# Patient Record
Sex: Female | Born: 1985 | Race: White | Hispanic: No | Marital: Married | Smoking: Never smoker
Health system: Southern US, Community
[De-identification: ages and names within clinical notes are randomized; demographics above are authoritative.]

## PROBLEM LIST (undated history)

## (undated) ENCOUNTER — Inpatient Hospital Stay (HOSPITAL_COMMUNITY): Payer: Self-pay

## (undated) DIAGNOSIS — F419 Anxiety disorder, unspecified: Secondary | ICD-10-CM

## (undated) DIAGNOSIS — Z8742 Personal history of other diseases of the female genital tract: Secondary | ICD-10-CM

## (undated) DIAGNOSIS — A749 Chlamydial infection, unspecified: Secondary | ICD-10-CM

## (undated) DIAGNOSIS — R112 Nausea with vomiting, unspecified: Secondary | ICD-10-CM

## (undated) DIAGNOSIS — K219 Gastro-esophageal reflux disease without esophagitis: Secondary | ICD-10-CM

## (undated) DIAGNOSIS — R51 Headache: Secondary | ICD-10-CM

## (undated) DIAGNOSIS — N189 Chronic kidney disease, unspecified: Secondary | ICD-10-CM

## (undated) DIAGNOSIS — E039 Hypothyroidism, unspecified: Secondary | ICD-10-CM

## (undated) DIAGNOSIS — Z8619 Personal history of other infectious and parasitic diseases: Secondary | ICD-10-CM

## (undated) DIAGNOSIS — F32A Depression, unspecified: Secondary | ICD-10-CM

## (undated) DIAGNOSIS — F329 Major depressive disorder, single episode, unspecified: Secondary | ICD-10-CM

## (undated) DIAGNOSIS — R519 Headache, unspecified: Secondary | ICD-10-CM

## (undated) DIAGNOSIS — I209 Angina pectoris, unspecified: Secondary | ICD-10-CM

## (undated) DIAGNOSIS — Z9889 Other specified postprocedural states: Secondary | ICD-10-CM

## (undated) DIAGNOSIS — E063 Autoimmune thyroiditis: Secondary | ICD-10-CM

## (undated) HISTORY — PX: TOOTH EXTRACTION: SUR596

## (undated) HISTORY — DX: Personal history of other infectious and parasitic diseases: Z86.19

---

## 2014-01-12 ENCOUNTER — Emergency Department (HOSPITAL_COMMUNITY): Payer: 59

## 2014-01-12 ENCOUNTER — Observation Stay (HOSPITAL_COMMUNITY)
Admission: EM | Admit: 2014-01-12 | Discharge: 2014-01-14 | Disposition: A | Discharge status: Home / Self Care | Payer: 59 | Attending: General Surgery | Admitting: General Surgery

## 2014-01-12 ENCOUNTER — Encounter (HOSPITAL_COMMUNITY): Payer: Self-pay | Admitting: Emergency Medicine

## 2014-01-12 DIAGNOSIS — K3580 Unspecified acute appendicitis: Principal | ICD-10-CM | POA: Insufficient documentation

## 2014-01-12 DIAGNOSIS — R112 Nausea with vomiting, unspecified: Secondary | ICD-10-CM

## 2014-01-12 DIAGNOSIS — R1031 Right lower quadrant pain: Secondary | ICD-10-CM

## 2014-01-12 DIAGNOSIS — F411 Generalized anxiety disorder: Secondary | ICD-10-CM | POA: Diagnosis not present

## 2014-01-12 LAB — COMPREHENSIVE METABOLIC PANEL
ALT: 11 U/L (ref 0–35)
ANION GAP: 14 (ref 5–15)
AST: 14 U/L (ref 0–37)
Albumin: 4.5 g/dL (ref 3.5–5.2)
Alkaline Phosphatase: 45 U/L (ref 39–117)
BUN: 10 mg/dL (ref 6–23)
CALCIUM: 9.1 mg/dL (ref 8.4–10.5)
CO2: 22 mEq/L (ref 19–32)
Chloride: 101 mEq/L (ref 96–112)
Creatinine, Ser: 0.62 mg/dL (ref 0.50–1.10)
GFR calc non Af Amer: >90 mL/min (ref >90)
GLUCOSE: 120 mg/dL — AB (ref 70–99)
POTASSIUM: 3.8 meq/L (ref 3.7–5.3)
Sodium: 137 mEq/L (ref 137–147)
TOTAL PROTEIN: 6.8 g/dL (ref 6.0–8.3)
Total Bilirubin: 1.2 mg/dL (ref 0.3–1.2)

## 2014-01-12 LAB — CBC WITH DIFFERENTIAL/PLATELET
Basophils Absolute: 0 K/uL (ref 0.0–0.1)
Basophils Relative: 0 % (ref 0–1)
Eosinophils Absolute: 0 K/uL (ref 0.0–0.7)
Eosinophils Relative: 0 % (ref 0–5)
HCT: 38 % (ref 36.0–46.0)
Hemoglobin: 13.5 g/dL (ref 12.0–15.0)
Lymphocytes Relative: 10 % — ABNORMAL LOW (ref 12–46)
Lymphs Abs: 1.1 K/uL (ref 0.7–4.0)
MCH: 30.5 pg (ref 26.0–34.0)
MCHC: 35.5 g/dL (ref 30.0–36.0)
MCV: 86 fL (ref 78.0–100.0)
Monocytes Absolute: 0.7 K/uL (ref 0.1–1.0)
Monocytes Relative: 6 % (ref 3–12)
Neutro Abs: 9.7 K/uL — ABNORMAL HIGH (ref 1.7–7.7)
Neutrophils Relative %: 84 % — ABNORMAL HIGH (ref 43–77)
Platelets: 186 K/uL (ref 150–400)
RBC: 4.42 MIL/uL (ref 3.87–5.11)
RDW: 12 % (ref 11.5–15.5)
WBC: 11.5 K/uL — ABNORMAL HIGH (ref 4.0–10.5)

## 2014-01-12 LAB — URINALYSIS, ROUTINE W REFLEX MICROSCOPIC
Bilirubin Urine: NEGATIVE
Glucose, UA: NEGATIVE mg/dL
Hgb urine dipstick: NEGATIVE
Ketones, ur: NEGATIVE mg/dL
Leukocytes, UA: NEGATIVE
Nitrite: NEGATIVE
Protein, ur: NEGATIVE mg/dL
Specific Gravity, Urine: 1.012 (ref 1.005–1.030)
Urobilinogen, UA: 0.2 mg/dL (ref 0.0–1.0)
pH: 5.5 (ref 5.0–8.0)

## 2014-01-12 LAB — WET PREP, GENITAL
Trich, Wet Prep: NONE SEEN
Yeast Wet Prep HPF POC: NONE SEEN

## 2014-01-12 LAB — POC URINE PREG, ED: PREG TEST UR: NEGATIVE

## 2014-01-12 LAB — LIPASE, BLOOD: LIPASE: 23 U/L (ref 11–59)

## 2014-01-12 LAB — SYPHILIS: RPR W/REFLEX TO RPR TITER AND TREPONEMAL ANTIBODIES, TRADITIONAL SCREENING AND DIAGNOSIS ALGORITHM

## 2014-01-12 LAB — HIV ANTIBODY (ROUTINE TESTING W REFLEX): HIV 1&2 Ab, 4th Generation: NONREACTIVE

## 2014-01-12 MED ORDER — ONDANSETRON HCL 4 MG/2ML IJ SOLN: 4.0000 mg | Freq: Four times a day (QID) | INTRAMUSCULAR | Status: DC | PRN

## 2014-01-12 MED ORDER — IOHEXOL 300 MG/ML  SOLN
25.0000 mL | Freq: Once | INTRAMUSCULAR | Status: AC | PRN
  Administered 2014-01-12: 25 mL via ORAL

## 2014-01-12 MED ORDER — SODIUM CHLORIDE 0.9 % IV SOLN
1000.0000 mL | INTRAVENOUS | Status: DC
  Administered 2014-01-12: 1000 mL via INTRAVENOUS

## 2014-01-12 MED ORDER — SODIUM CHLORIDE 0.9 % IV SOLN
INTRAVENOUS | Status: DC
  Administered 2014-01-13 – 2014-01-14 (×3): via INTRAVENOUS

## 2014-01-12 MED ORDER — SERTRALINE HCL 100 MG PO TABS
100.0000 mg | ORAL_TABLET | Freq: Every day | ORAL | Status: DC
  Administered 2014-01-14: 100 mg via ORAL
  Filled 2014-01-12: qty 2
  Filled 2014-01-12: qty 1

## 2014-01-12 MED ORDER — ACETAMINOPHEN 325 MG PO TABS: 650.0000 mg | ORAL_TABLET | Freq: Four times a day (QID) | ORAL | Status: DC | PRN

## 2014-01-12 MED ORDER — CLINDAMYCIN PHOSPHATE 900 MG/50ML IV SOLN
900.0000 mg | Freq: Three times a day (TID) | INTRAVENOUS | Status: DC
  Administered 2014-01-13 (×2): 900 mg via INTRAVENOUS
  Filled 2014-01-12 (×4): qty 50

## 2014-01-12 MED ORDER — MORPHINE SULFATE 4 MG/ML IJ SOLN
4.0000 mg | Freq: Once | INTRAMUSCULAR | Status: AC
  Administered 2014-01-12: 4 mg via INTRAVENOUS
  Filled 2014-01-12: qty 1

## 2014-01-12 MED ORDER — AMITRIPTYLINE HCL 50 MG PO TABS
50.0000 mg | ORAL_TABLET | Freq: Every day | ORAL | Status: DC
  Administered 2014-01-12 – 2014-01-13 (×2): 50 mg via ORAL
  Filled 2014-01-12: qty 1
  Filled 2014-01-12 (×3): qty 2

## 2014-01-12 MED ORDER — METRONIDAZOLE IN NACL 5-0.79 MG/ML-% IV SOLN
500.0000 mg | Freq: Three times a day (TID) | INTRAVENOUS | Status: DC
  Administered 2014-01-13 (×2): 500 mg via INTRAVENOUS
  Filled 2014-01-12 (×4): qty 100

## 2014-01-12 MED ORDER — MORPHINE SULFATE 2 MG/ML IJ SOLN
2.0000 mg | INTRAMUSCULAR | Status: DC | PRN
  Administered 2014-01-12 – 2014-01-14 (×6): 2 mg via INTRAVENOUS
  Filled 2014-01-12 (×6): qty 1

## 2014-01-12 MED ORDER — SODIUM CHLORIDE 0.9 % IV SOLN
1000.0000 mL | Freq: Once | INTRAVENOUS | Status: AC
  Administered 2014-01-12: 1000 mL via INTRAVENOUS

## 2014-01-12 MED ORDER — ONDANSETRON HCL 4 MG/2ML IJ SOLN
4.0000 mg | Freq: Once | INTRAMUSCULAR | Status: AC
  Administered 2014-01-12: 4 mg via INTRAVENOUS
  Filled 2014-01-12: qty 2

## 2014-01-12 MED ORDER — ACETAMINOPHEN 650 MG RE SUPP: 650.0000 mg | Freq: Four times a day (QID) | RECTAL | Status: DC | PRN

## 2014-01-12 MED ORDER — IOHEXOL 300 MG/ML  SOLN
100.0000 mL | Freq: Once | INTRAMUSCULAR | Status: AC | PRN
  Administered 2014-01-12: 100 mL via INTRAVENOUS

## 2014-01-12 NOTE — ED Notes (Signed)
MD at bedside. 

## 2014-01-12 NOTE — ED Notes (Signed)
Brought pt back to room via wheelchair with family in tow; pt getting undressed and into a gown at this time; family at bedside; Tramaine, EMT aware pt is in room 

## 2014-01-12 NOTE — ED Provider Notes (Signed)
CSN: [id number]     Arrival date & time 01/12/14  1402 History   First MD Initiated Contact with Patient 01/12/14 1708     Chief Complaint  Patient presents with  . Abdominal Pain    Patient is a 28 y.o. female presenting with abdominal pain. The history is provided by the patient.  Abdominal Pain Pain location:  LLQ and RLQ Pain quality: sharp   Pain radiates to:  Does not radiate Pain severity:  Moderate Onset quality: Pt noticed the pain this morning at 9am. Timing:  Constant Chronicity:  New Relieved by:  Nothing Worsened by:  Position changes and movement (Going over bumps in the road increase the pain.) Ineffective treatments:  None tried Associated symptoms: vomiting   Associated symptoms: no diarrhea, no dysuria, no fever, no vaginal bleeding and no vaginal discharge   LMP 3 weeks ago.  Pt has an implanted birth control device in her arm.  History reviewed. No pertinent past medical history. History reviewed. No pertinent past surgical history. History reviewed. No pertinent family history. History  Substance Use Topics  . Smoking status: Never Smoker   . Smokeless tobacco: Not on file  . Alcohol Use: Yes   OB History   Grav Para Term Preterm Abortions TAB SAB Ect Mult Living                 Review of Systems  Constitutional: Negative for fever.  Gastrointestinal: Positive for vomiting and abdominal pain. Negative for diarrhea.  Genitourinary: Negative for dysuria, vaginal bleeding and vaginal discharge.  All other systems reviewed and are negative.     Allergies  Penicillins  Home Medications   Prior to Admission medications   Medication Sig Start Date End Date Taking? Authorizing Provider  amitriptyline (ELAVIL) 50 MG tablet Take 50 mg by mouth at bedtime.   Yes Historical Provider, MD  butalbital-acetaminophen-caffeine (FIORICET, ESGIC) 50-325-40 MG per tablet Take 1 tablet by mouth every 6 (six) hours as needed for headache or migraine.   Yes  Historical Provider, MD  etonogestrel (IMPLANON) 68 MG IMPL implant Inject 1 each into the skin once.   Yes Historical Provider, MD  sertraline (ZOLOFT) 100 MG tablet Take 100 mg by mouth daily.   Yes Historical Provider, MD  Simethicone (GAS-X PO) Take 1 tablet by mouth as needed (for gas).   Yes Historical Provider, MD  sulfamethoxazole-trimethoprim (BACTRIM DS) 800-160 MG per tablet Take 1 tablet by mouth 2 (two) times daily. For 3 days for UTI    Historical Provider, MD   BP 100/69  Pulse 95  Temp(Src) 98.9 F (37.2 C) (Oral)  Resp 18  Ht 5\' 10"  (1.778 m)  Wt 160 lb (72.576 kg)  BMI 22.96 kg/m2  SpO2 100%  LMP 12/15/2013 Physical Exam  Nursing note and vitals reviewed. Constitutional: She appears well-developed and well-nourished. No distress.  HENT:  Head: Normocephalic and atraumatic.  Right Ear: External ear normal.  Left Ear: External ear normal.  Eyes: Conjunctivae are normal. Right eye exhibits no discharge. Left eye exhibits no discharge. No scleral icterus.  Neck: Neck supple. No tracheal deviation present.  Cardiovascular: Normal rate, regular rhythm and intact distal pulses.   Pulmonary/Chest: Effort normal and breath sounds normal. No stridor. No respiratory distress. She has no wheezes. She has no rales.  Abdominal: Soft. Bowel sounds are normal. She exhibits no distension. There is tenderness in the right upper quadrant, right lower quadrant, epigastric area and left lower quadrant. There is guarding. There  is no rigidity and no rebound. No hernia. Hernia confirmed negative in the right inguinal area and confirmed negative in the left inguinal area.  Genitourinary: Uterus is tender. Uterus is not enlarged. Cervix exhibits discharge (white mucoid discharge). Cervix exhibits no motion tenderness. Right adnexum displays tenderness. Right adnexum displays no mass and no fullness. Left adnexum displays no mass, no tenderness and no fullness. No bleeding around the vagina. No  signs of injury around the vagina. Vaginal discharge found.  Musculoskeletal: She exhibits no edema and no tenderness.  Neurological: She is alert. She has normal strength. No cranial nerve deficit (no facial droop, extraocular movements intact, no slurred speech) or sensory deficit. She exhibits normal muscle tone. She displays no seizure activity. Coordination normal.  Skin: Skin is warm and dry. No rash noted.  Psychiatric: She has a normal mood and affect.    ED Course  Procedures (including critical care time) Labs Review Labs Reviewed  WET PREP, GENITAL - Abnormal; Notable for the following:    Clue Cells Wet Prep HPF POC FEW (*)    WBC, Wet Prep HPF POC FEW (*)    All other components within normal limits  CBC WITH DIFFERENTIAL - Abnormal; Notable for the following:    WBC 11.5 (*)    Neutrophils Relative % 84 (*)    Neutro Abs 9.7 (*)    Lymphocytes Relative 10 (*)    All other components within normal limits  COMPREHENSIVE METABOLIC PANEL - Abnormal; Notable for the following:    Glucose, Bld 120 (*)    All other components within normal limits  GC/CHLAMYDIA PROBE AMP  URINALYSIS, ROUTINE W REFLEX MICROSCOPIC  LIPASE, BLOOD  RPR  HIV ANTIBODY (ROUTINE TESTING)  POC URINE PREG, ED  POC URINE PREG, ED    Imaging Review Ct Abdomen Pelvis W Contrast  01/12/2014   CLINICAL DATA:  Abdominal pain and nausea beginning this morning.  EXAM: CT ABDOMEN AND PELVIS WITH CONTRAST  TECHNIQUE: Multidetector CT imaging of the abdomen and pelvis was performed using the standard protocol following bolus administration of intravenous contrast.  CONTRAST:  100 mL OMNIPAQUE IOHEXOL 300 MG/ML  SOLN  COMPARISON:  None.  FINDINGS: Lung bases are clear.  No pleural or pericardial effusion.  The gallbladder, liver, spleen, adrenal glands, pancreas and left kidney appear normal. There is a punctate nonobstructing stone in the upper pole right kidney. Small low attenuating lesion in the lower pole the  right kidney is consistent with a cyst. Small amount of free pelvic fluid is consistent with physiologic change. The appendix is visualized in the right lower quadrant. A appears mildly prominent. A small amount of fluid is seen off the tip of the cecum. The stomach and small and large bowel appear normal. No focal fluid collection is identified. No lymphadenopathy is seen. No focal bony abnormality is identified.  IMPRESSION: Findings suspicious for acute appendicitis. The examination is otherwise negative.   Electronically Signed   By: Drusilla Kanner M.D.   On: 01/12/2014 20:13   Medications  0.9 %  sodium chloride infusion (0 mLs Intravenous Stopped 01/12/14 1954)    Followed by  0.9 %  sodium chloride infusion (not administered)  morphine 4 MG/ML injection 4 mg (not administered)  ondansetron (ZOFRAN) injection 4 mg (4 mg Intravenous Given 01/12/14 1808)  morphine 4 MG/ML injection 4 mg (4 mg Intravenous Given 01/12/14 1808)  iohexol (OMNIPAQUE) 300 MG/ML solution 25 mL (25 mLs Oral Contrast Given 01/12/14 1818)  iohexol (OMNIPAQUE)  300 MG/ML solution 100 mL (100 mLs Intravenous Contrast Given 01/12/14 1932)     MDM   Final diagnoses:  Acute appendicitis, unspecified acute appendicitis type    CT suggests early appendicitis.  Exam is concerning.  Discussed with Dr Dwain Sarna who will see the patient in the ED.    Linwood Dibbles, MD 01/12/14 2041

## 2014-01-12 NOTE — ED Notes (Signed)
She c/o abd pain and nausea since this am. Hurts to walk. She reports a normal bowel movement this am and denies any changes in bowel/bladder. She vomited once.

## 2014-01-12 NOTE — H&P (Signed)
Heather [name] is an 28 y.o. female.   Chief Complaint: abdominal pain HPI: 67 yof with history of undiagnosed chest pain (she has been evaluated fully and even spent a week at [facility] clinic with no diagnosis) who presents with right lower quadrant pain for about 12 hours that was worsening and not getting better at home.  She had some n/v, having bms. No fevers.    History reviewed. No pertinent past medical history.anxiety  History reviewed. No pertinent past surgical history.  History reviewed. No pertinent family history. Social History:  reports that she has never smoked. She does not have any smokeless tobacco history on file. She reports that she drinks alcohol. She reports that she does not use illicit drugs.  Allergies:  Allergies  Allergen Reactions  . Penicillins Other (See Comments)    Unknown reaction; precaution; entire family allergic    meds zoloft amitryptiline  Results for orders placed during the hospital encounter of 01/12/14 (from the past 48 hour(s))  CBC WITH DIFFERENTIAL     Status: Abnormal   Collection Time    01/12/14  2:22 PM      Result Value Ref Range   WBC 11.5 (*) 4.0 - 10.5 K/uL   RBC 4.42  3.87 - 5.11 MIL/uL   Hemoglobin 13.5  12.0 - 15.0 g/dL   HCT 38.0  36.0 - 46.0 %   MCV 86.0  78.0 - 100.0 fL   MCH 30.5  26.0 - 34.0 pg   MCHC 35.5  30.0 - 36.0 g/dL   RDW 12.0  11.5 - 15.5 %   Platelets 186  150 - 400 K/uL   Neutrophils Relative % 84 (*) 43 - 77 %   Neutro Abs 9.7 (*) 1.7 - 7.7 K/uL   Lymphocytes Relative 10 (*) 12 - 46 %   Lymphs Abs 1.1  0.7 - 4.0 K/uL   Monocytes Relative 6  3 - 12 %   Monocytes Absolute 0.7  0.1 - 1.0 K/uL   Eosinophils Relative 0  0 - 5 %   Eosinophils Absolute 0.0  0.0 - 0.7 K/uL   Basophils Relative 0  0 - 1 %   Basophils Absolute 0.0  0.0 - 0.1 K/uL  COMPREHENSIVE METABOLIC PANEL     Status: Abnormal   Collection Time    01/12/14  2:22 PM      Result Value Ref Range   Sodium 137  137 - 147 mEq/L   Potassium  3.8  3.7 - 5.3 mEq/L   Chloride 101  96 - 112 mEq/L   CO2 22  19 - 32 mEq/L   Glucose, Bld 120 (*) 70 - 99 mg/dL   BUN 10  6 - 23 mg/dL   Creatinine, Ser 0.62  0.50 - 1.10 mg/dL   Calcium 9.1  8.4 - 10.5 mg/dL   Total Protein 6.8  6.0 - 8.3 g/dL   Albumin 4.5  3.5 - 5.2 g/dL   AST 14  0 - 37 U/L   ALT 11  0 - 35 U/L   Alkaline Phosphatase 45  39 - 117 U/L   Total Bilirubin 1.2  0.3 - 1.2 mg/dL   GFR calc non Af Amer >90  >90 mL/min   GFR calc Af Amer >90  >90 mL/min   Comment: (NOTE)     The eGFR has been calculated using the CKD EPI equation.     This calculation has not been validated in all clinical situations.  eGFR's persistently <90 mL/min signify possible Chronic Kidney     Disease.   Anion gap 14  5 - 15  WET PREP, GENITAL     Status: Abnormal   Collection Time    01/12/14  5:32 PM      Result Value Ref Range   Yeast Wet Prep HPF POC NONE SEEN  NONE SEEN   Trich, Wet Prep NONE SEEN  NONE SEEN   Clue Cells Wet Prep HPF POC FEW (*) NONE SEEN   WBC, Wet Prep HPF POC FEW (*) NONE SEEN  LIPASE, BLOOD     Status: None   Collection Time    01/12/14  5:50 PM      Result Value Ref Range   Lipase 23  11 - 59 U/L  URINALYSIS, ROUTINE W REFLEX MICROSCOPIC     Status: None   Collection Time    01/12/14  6:43 PM      Result Value Ref Range   Color, Urine YELLOW  YELLOW   APPearance CLEAR  CLEAR   Specific Gravity, Urine 1.012  1.005 - 1.030   pH 5.5  5.0 - 8.0   Glucose, UA NEGATIVE  NEGATIVE mg/dL   Hgb urine dipstick NEGATIVE  NEGATIVE   Bilirubin Urine NEGATIVE  NEGATIVE   Ketones, ur NEGATIVE  NEGATIVE mg/dL   Protein, ur NEGATIVE  NEGATIVE mg/dL   Urobilinogen, UA 0.2  0.0 - 1.0 mg/dL   Nitrite NEGATIVE  NEGATIVE   Leukocytes, UA NEGATIVE  NEGATIVE   Comment: MICROSCOPIC NOT DONE ON URINES WITH NEGATIVE PROTEIN, BLOOD, LEUKOCYTES, NITRITE, OR GLUCOSE <1000 mg/dL.  POC URINE PREG, ED     Status: None   Collection Time    01/12/14  6:52 PM      Result Value Ref  Range   Preg Test, Ur NEGATIVE  NEGATIVE   Comment:            THE SENSITIVITY OF THIS     METHODOLOGY IS >24 mIU/mL   Ct Abdomen Pelvis W Contrast  01/12/2014   CLINICAL DATA:  Abdominal pain and nausea beginning this morning.  EXAM: CT ABDOMEN AND PELVIS WITH CONTRAST  TECHNIQUE: Multidetector CT imaging of the abdomen and pelvis was performed using the standard protocol following bolus administration of intravenous contrast.  CONTRAST:  100 mL OMNIPAQUE IOHEXOL 300 MG/ML  SOLN  COMPARISON:  None.  FINDINGS: Lung bases are clear.  No pleural or pericardial effusion.  The gallbladder, liver, spleen, adrenal glands, pancreas and left kidney appear normal. There is a punctate nonobstructing stone in the upper pole right kidney. Small low attenuating lesion in the lower pole the right kidney is consistent with a cyst. Small amount of free pelvic fluid is consistent with physiologic change. The appendix is visualized in the right lower quadrant. A appears mildly prominent. A small amount of fluid is seen off the tip of the cecum. The stomach and small and large bowel appear normal. No focal fluid collection is identified. No lymphadenopathy is seen. No focal bony abnormality is identified.  IMPRESSION: Findings suspicious for acute appendicitis. The examination is otherwise negative.   Electronically Signed   By: Inge Rise M.D.   On: 01/12/2014 20:13    Review of Systems  Constitutional: Negative for fever and chills.  Cardiovascular: Positive for chest pain (chronic).  Gastrointestinal: Positive for nausea, vomiting and abdominal pain. Negative for diarrhea and constipation.    Blood pressure 103/68, pulse 94, temperature 98.9 F (37.2 C),  temperature source Oral, resp. rate 18, height '5\' 10"'  (1.778 m), weight 160 lb (72.576 kg), last menstrual period 12/15/2013, SpO2 98.00%. Physical Exam  Vitals reviewed. Constitutional: She is oriented to person, place, and time. She appears  well-developed and well-nourished.  Eyes: No scleral icterus.  Cardiovascular: Normal rate, regular rhythm and normal heart sounds.   Respiratory: Effort normal and breath sounds normal. She has no wheezes. She has no rales.  GI: Soft. Bowel sounds are normal. She exhibits no distension. There is tenderness in the right lower quadrant. There is tenderness at McBurney's point.  Lymphadenopathy:    She has no cervical adenopathy.  Neurological: She is alert and oriented to person, place, and time.     Assessment/Plan Likely acute appendicitis  I think by exam, wbc and ct she likely has early appendicitis. Will plan on admission, iv abx and lap appy in am.  Discussed options, postop recovery and risks.    Jadon Harbaugh 01/12/2014, 10:14 PM

## 2014-01-12 NOTE — ED Notes (Signed)
Pt finished drinking oral CT contrast. CT made aware. 

## 2014-01-12 NOTE — ED Notes (Signed)
Dr. Wakefield at bedside. 

## 2014-01-12 NOTE — ED Notes (Signed)
Attempted to call report. Floor RN unable to accept report.  

## 2014-01-13 ENCOUNTER — Observation Stay (HOSPITAL_COMMUNITY): Payer: 59 | Admitting: Anesthesiology

## 2014-01-13 ENCOUNTER — Encounter (HOSPITAL_COMMUNITY): Payer: 59 | Admitting: Anesthesiology

## 2014-01-13 ENCOUNTER — Encounter (HOSPITAL_COMMUNITY): Payer: Self-pay | Admitting: Anesthesiology

## 2014-01-13 ENCOUNTER — Encounter (HOSPITAL_COMMUNITY)
Admission: EM | Disposition: A | Discharge status: Home / Self Care | Payer: Self-pay | Source: Home / Self Care | Attending: Emergency Medicine

## 2014-01-13 DIAGNOSIS — K3580 Unspecified acute appendicitis: Secondary | ICD-10-CM

## 2014-01-13 HISTORY — PX: LAPAROSCOPIC APPENDECTOMY: SHX408

## 2014-01-13 LAB — SURGICAL PCR SCREEN
MRSA, PCR: NEGATIVE
Staphylococcus aureus: NEGATIVE

## 2014-01-13 LAB — GC/CHLAMYDIA PROBE AMP
CT Probe RNA: NEGATIVE
GC Probe RNA: NEGATIVE

## 2014-01-13 SURGERY — APPENDECTOMY, LAPAROSCOPIC
Anesthesia: General | Site: Abdomen

## 2014-01-13 MED ORDER — SUCCINYLCHOLINE CHLORIDE 20 MG/ML IJ SOLN
INTRAMUSCULAR | Status: AC
  Filled 2014-01-13: qty 1

## 2014-01-13 MED ORDER — LACTATED RINGERS IV SOLN
INTRAVENOUS | Status: DC | PRN
  Administered 2014-01-13 (×2): via INTRAVENOUS

## 2014-01-13 MED ORDER — ROCURONIUM BROMIDE 50 MG/5ML IV SOLN
INTRAVENOUS | Status: AC
  Filled 2014-01-13: qty 1

## 2014-01-13 MED ORDER — ARTIFICIAL TEARS OP OINT
TOPICAL_OINTMENT | OPHTHALMIC | Status: AC
  Filled 2014-01-13: qty 3.5

## 2014-01-13 MED ORDER — MIDAZOLAM HCL 5 MG/5ML IJ SOLN
INTRAMUSCULAR | Status: DC | PRN
  Administered 2014-01-13 (×2): 1 mg via INTRAVENOUS

## 2014-01-13 MED ORDER — PHENYLEPHRINE 40 MCG/ML (10ML) SYRINGE FOR IV PUSH (FOR BLOOD PRESSURE SUPPORT)
PREFILLED_SYRINGE | INTRAVENOUS | Status: AC
  Filled 2014-01-13: qty 10

## 2014-01-13 MED ORDER — NEOSTIGMINE METHYLSULFATE 10 MG/10ML IV SOLN
INTRAVENOUS | Status: DC | PRN
  Administered 2014-01-13: 4 mg via INTRAVENOUS

## 2014-01-13 MED ORDER — SODIUM CHLORIDE 0.9 % IR SOLN
Status: DC | PRN
  Administered 2014-01-13: 1000 mL

## 2014-01-13 MED ORDER — HYDROMORPHONE HCL PF 1 MG/ML IJ SOLN
0.2500 mg | INTRAMUSCULAR | Status: DC | PRN
  Administered 2014-01-13 (×4): 0.5 mg via INTRAVENOUS

## 2014-01-13 MED ORDER — HYDROMORPHONE HCL PF 1 MG/ML IJ SOLN
INTRAMUSCULAR | Status: AC
  Filled 2014-01-13: qty 1

## 2014-01-13 MED ORDER — OXYCODONE HCL 5 MG/5ML PO SOLN: 5.0000 mg | Freq: Once | ORAL | Status: DC | PRN

## 2014-01-13 MED ORDER — ONDANSETRON HCL 4 MG/2ML IJ SOLN
INTRAMUSCULAR | Status: AC
  Filled 2014-01-13: qty 2

## 2014-01-13 MED ORDER — NEOSTIGMINE METHYLSULFATE 10 MG/10ML IV SOLN
INTRAVENOUS | Status: AC
  Filled 2014-01-13: qty 1

## 2014-01-13 MED ORDER — OXYCODONE HCL 5 MG PO TABS: 5.0000 mg | ORAL_TABLET | Freq: Once | ORAL | Status: DC | PRN

## 2014-01-13 MED ORDER — PROPOFOL 10 MG/ML IV BOLUS
INTRAVENOUS | Status: AC
  Filled 2014-01-13: qty 20

## 2014-01-13 MED ORDER — ENOXAPARIN SODIUM 40 MG/0.4ML ~~LOC~~ SOLN
40.0000 mg | SUBCUTANEOUS | Status: DC
  Administered 2014-01-14: 40 mg via SUBCUTANEOUS
  Filled 2014-01-13: qty 0.4

## 2014-01-13 MED ORDER — ARTIFICIAL TEARS OP OINT
TOPICAL_OINTMENT | OPHTHALMIC | Status: DC | PRN
  Administered 2014-01-13: 1 via OPHTHALMIC

## 2014-01-13 MED ORDER — BUTALBITAL-APAP-CAFFEINE 50-325-40 MG PO TABS: 1.0000 | ORAL_TABLET | Freq: Four times a day (QID) | ORAL | Status: DC | PRN

## 2014-01-13 MED ORDER — ONDANSETRON HCL 4 MG/2ML IJ SOLN
INTRAMUSCULAR | Status: DC | PRN
  Administered 2014-01-13: 4 mg via INTRAVENOUS

## 2014-01-13 MED ORDER — PROMETHAZINE HCL 25 MG/ML IJ SOLN: 6.2500 mg | INTRAMUSCULAR | Status: DC | PRN

## 2014-01-13 MED ORDER — GLYCOPYRROLATE 0.2 MG/ML IJ SOLN
INTRAMUSCULAR | Status: AC
  Filled 2014-01-13: qty 2

## 2014-01-13 MED ORDER — BUPIVACAINE-EPINEPHRINE (PF) 0.25% -1:200000 IJ SOLN
INTRAMUSCULAR | Status: AC
  Filled 2014-01-13: qty 30

## 2014-01-13 MED ORDER — MIDAZOLAM HCL 2 MG/2ML IJ SOLN
INTRAMUSCULAR | Status: AC
  Filled 2014-01-13: qty 2

## 2014-01-13 MED ORDER — PROPOFOL 10 MG/ML IV BOLUS
INTRAVENOUS | Status: DC | PRN
  Administered 2014-01-13: 170 mg via INTRAVENOUS

## 2014-01-13 MED ORDER — DEXAMETHASONE SODIUM PHOSPHATE 4 MG/ML IJ SOLN
INTRAMUSCULAR | Status: DC | PRN
  Administered 2014-01-13: 8 mg via INTRAVENOUS

## 2014-01-13 MED ORDER — OXYCODONE-ACETAMINOPHEN 5-325 MG PO TABS
1.0000 | ORAL_TABLET | ORAL | Status: DC | PRN
Start: 2014-01-13 — End: 2014-01-14
  Administered 2014-01-13: 2 via ORAL
  Administered 2014-01-13 – 2014-01-14 (×2): 1 via ORAL
  Filled 2014-01-13: qty 2
  Filled 2014-01-13 (×3): qty 1

## 2014-01-13 MED ORDER — EPHEDRINE SULFATE 50 MG/ML IJ SOLN
INTRAMUSCULAR | Status: AC
  Filled 2014-01-13: qty 1

## 2014-01-13 MED ORDER — GLYCOPYRROLATE 0.2 MG/ML IJ SOLN
INTRAMUSCULAR | Status: DC | PRN
  Administered 2014-01-13: 0.6 mg via INTRAVENOUS

## 2014-01-13 MED ORDER — HYDROMORPHONE HCL PF 1 MG/ML IJ SOLN
0.5000 mg | INTRAMUSCULAR | Status: DC | PRN
  Administered 2014-01-13: 1 mg via INTRAVENOUS
  Filled 2014-01-13: qty 1

## 2014-01-13 MED ORDER — BUPIVACAINE-EPINEPHRINE 0.25% -1:200000 IJ SOLN
INTRAMUSCULAR | Status: DC | PRN
  Administered 2014-01-13: 15 mL

## 2014-01-13 MED ORDER — FENTANYL CITRATE 0.05 MG/ML IJ SOLN
INTRAMUSCULAR | Status: AC
  Filled 2014-01-13: qty 5

## 2014-01-13 MED ORDER — PHENYLEPHRINE HCL 10 MG/ML IJ SOLN
INTRAMUSCULAR | Status: DC | PRN
  Administered 2014-01-13 (×2): 80 ug via INTRAVENOUS

## 2014-01-13 MED ORDER — 0.9 % SODIUM CHLORIDE (POUR BTL) OPTIME
TOPICAL | Status: DC | PRN
  Administered 2014-01-13: 1000 mL

## 2014-01-13 MED ORDER — LIDOCAINE HCL (CARDIAC) 20 MG/ML IV SOLN
INTRAVENOUS | Status: DC | PRN
  Administered 2014-01-13: 80 mg via INTRAVENOUS

## 2014-01-13 MED ORDER — FENTANYL CITRATE 0.05 MG/ML IJ SOLN
INTRAMUSCULAR | Status: DC | PRN
  Administered 2014-01-13 (×4): 50 ug via INTRAVENOUS

## 2014-01-13 MED ORDER — ROCURONIUM BROMIDE 100 MG/10ML IV SOLN
INTRAVENOUS | Status: DC | PRN
  Administered 2014-01-13: 20 mg via INTRAVENOUS

## 2014-01-13 SURGICAL SUPPLY — 47 items
APPLIER CLIP ROT 10 11.4 M/L (STAPLE)
BLADE SURG ROTATE 9660 (MISCELLANEOUS) IMPLANT
CANISTER SUCTION 2500CC (MISCELLANEOUS) ×3 IMPLANT
CHLORAPREP W/TINT 26ML (MISCELLANEOUS) ×3 IMPLANT
CLIP APPLIE ROT 10 11.4 M/L (STAPLE) IMPLANT
CLOSURE STERI-STRIP 1/2X4 (GAUZE/BANDAGES/DRESSINGS) ×1
CLSR STERI-STRIP ANTIMIC 1/2X4 (GAUZE/BANDAGES/DRESSINGS) ×2 IMPLANT
COVER SURGICAL LIGHT HANDLE (MISCELLANEOUS) ×3 IMPLANT
CUTTER LINEAR ENDO 35 ETS (STAPLE) IMPLANT
CUTTER LINEAR ENDO 35 ETS TH (STAPLE) IMPLANT
DERMABOND ADHESIVE PROPEN (GAUZE/BANDAGES/DRESSINGS) ×2
DERMABOND ADVANCED (GAUZE/BANDAGES/DRESSINGS) ×2
DERMABOND ADVANCED .7 DNX12 (GAUZE/BANDAGES/DRESSINGS) ×1 IMPLANT
DERMABOND ADVANCED .7 DNX6 (GAUZE/BANDAGES/DRESSINGS) ×1 IMPLANT
DRAPE UTILITY 15X26 W/TAPE STR (DRAPE) ×6 IMPLANT
DRSG TEGADERM 2-3/8X2-3/4 SM (GAUZE/BANDAGES/DRESSINGS) ×3 IMPLANT
ELECT REM PT RETURN 9FT ADLT (ELECTROSURGICAL) ×3
ELECTRODE REM PT RTRN 9FT ADLT (ELECTROSURGICAL) ×1 IMPLANT
ENDOLOOP SUT PDS II  0 18 (SUTURE)
ENDOLOOP SUT PDS II 0 18 (SUTURE) IMPLANT
GLOVE BIOGEL PI IND STRL 7.0 (GLOVE) ×1 IMPLANT
GLOVE BIOGEL PI IND STRL 8 (GLOVE) ×1 IMPLANT
GLOVE BIOGEL PI INDICATOR 7.0 (GLOVE) ×2
GLOVE BIOGEL PI INDICATOR 8 (GLOVE) ×2
GLOVE ECLIPSE 7.5 STRL STRAW (GLOVE) ×3 IMPLANT
GLOVE SURG SS PI 6.5 STRL IVOR (GLOVE) ×3 IMPLANT
GOWN STRL REUS W/ TWL LRG LVL3 (GOWN DISPOSABLE) ×3 IMPLANT
GOWN STRL REUS W/TWL LRG LVL3 (GOWN DISPOSABLE) ×6
KIT BASIN OR (CUSTOM PROCEDURE TRAY) ×3 IMPLANT
KIT ROOM TURNOVER OR (KITS) ×3 IMPLANT
NS IRRIG 1000ML POUR BTL (IV SOLUTION) ×3 IMPLANT
PAD ARMBOARD 7.5X6 YLW CONV (MISCELLANEOUS) ×6 IMPLANT
PENCIL BUTTON HOLSTER BLD 10FT (ELECTRODE) IMPLANT
POUCH SPECIMEN RETRIEVAL 10MM (ENDOMECHANICALS) ×3 IMPLANT
RELOAD /EVU35 (ENDOMECHANICALS) IMPLANT
RELOAD CUTTER ETS 35MM STAND (ENDOMECHANICALS) IMPLANT
SCALPEL HARMONIC ACE (MISCELLANEOUS) ×3 IMPLANT
SET IRRIG TUBING LAPAROSCOPIC (IRRIGATION / IRRIGATOR) ×3 IMPLANT
SPECIMEN JAR SMALL (MISCELLANEOUS) ×3 IMPLANT
SUT MNCRL AB 4-0 PS2 18 (SUTURE) ×3 IMPLANT
TOWEL OR 17X24 6PK STRL BLUE (TOWEL DISPOSABLE) ×3 IMPLANT
TOWEL OR 17X26 10 PK STRL BLUE (TOWEL DISPOSABLE) ×3 IMPLANT
TRAY FOLEY CATH 16FR SILVER (SET/KITS/TRAYS/PACK) ×3 IMPLANT
TRAY LAPAROSCOPIC (CUSTOM PROCEDURE TRAY) ×3 IMPLANT
TROCAR XCEL 12X100 BLDLESS (ENDOMECHANICALS) ×3 IMPLANT
TROCAR XCEL BLUNT TIP 100MML (ENDOMECHANICALS) ×3 IMPLANT
TROCAR XCEL NON-BLD 5MMX100MML (ENDOMECHANICALS) ×3 IMPLANT

## 2014-01-13 NOTE — Interval H&P Note (Signed)
History and Physical Interval Note: The patient has been having symptoms for less than 24 hours.  She has a history of undiagnosed chest pain for which she has had an [facility].  No etiology has beenfound.  She has not been checked for gallstone specifically. She has a supraumbilical piercing that is no longer bing used.  Lap Appy planned.  Risks and benefits explained to the patient. 01/13/2014 6:30 AM  Heather [name]  has presented today for surgery, with the diagnosis of Acute Appendicitis  The various methods of treatment have been discussed with the patient and family. After consideration of risks, benefits and other options for treatment, the patient has consented to  Procedure(s): APPENDECTOMY LAPAROSCOPIC (N/A) as a surgical intervention .  The patient's history has been reviewed, patient examined, no change in status, stable for surgery.  I have reviewed the patient's chart and labs.  Questions were answered to the patient's satisfaction.     Heather [name], Heather LamasJAMES [name]

## 2014-01-13 NOTE — Anesthesia Preprocedure Evaluation (Addendum)
Anesthesia Evaluation  Patient identified by MRN, date of birth, ID band Patient awake    Reviewed: Allergy & Precautions, H&P , NPO status , Patient's Chart, lab work & pertinent test results  Airway Mallampati: I  Neck ROM: Full    Dental  (+) Teeth Intact   Pulmonary neg pulmonary ROS,  breath sounds clear to auscultation        Cardiovascular negative cardio ROS  Rhythm:Regular Rate:Normal     Neuro/Psych negative neurological ROS     GI/Hepatic negative GI ROS, Neg liver ROS,   Endo/Other  negative endocrine ROS  Renal/GU negative Renal ROS     Musculoskeletal   Abdominal   Peds  Hematology negative hematology ROS (+)   Anesthesia Other Findings   Reproductive/Obstetrics                          Anesthesia Physical Anesthesia Plan  ASA: II  Anesthesia Plan: General   Post-op Pain Management:    Induction:   Airway Management Planned: Oral ETT  Additional Equipment:   Intra-op Plan:   Post-operative Plan:   Informed Consent:   Dental advisory given  Plan Discussed with: Anesthesiologist, Surgeon and CRNA  Anesthesia Plan Comments:         Anesthesia Quick Evaluation

## 2014-01-13 NOTE — Transfer of Care (Signed)
Immediate Anesthesia Transfer of Care Note  Patient: Heather [name]  Procedure(s) Performed: Procedure(s): APPENDECTOMY LAPAROSCOPIC (N/A)  Patient Location: PACU  Anesthesia Type:General  Level of Consciousness: awake, alert  and oriented  Airway & Oxygen Therapy: Patient Spontanous Breathing and Patient connected to face mask oxygen  Post-op Assessment: Report given to PACU RN  Post vital signs: Reviewed and stable  Complications: No apparent anesthesia complications

## 2014-01-13 NOTE — Progress Notes (Signed)
Utilization review completed.  

## 2014-01-13 NOTE — Op Note (Signed)
OPERATIVE REPORT  DATE OF OPERATION: 01/12/2014 - 01/13/2014  PATIENT:  Heather [name]  28 y.[name]. female  PRE-OPERATIVE DIAGNOSIS:   Acute Appendicitis  POST-OPERATIVE DIAGNOSIS:   Acute Appendicitis  PROCEDURE:  Procedure(s): APPENDECTOMY LAPAROSCOPIC  SURGEON:  Surgeon(s): Heather RidgesJames [name] Flonnie Wierman, MD  ASSISTANT: None  ANESTHESIA:   general  EBL: <25 ml  BLOOD ADMINISTERED: none  DRAINS: none   SPECIMEN:  Source of Specimen:  Appendix  COUNTS CORRECT:  YES  PROCEDURE DETAILS: The patient was taken to the operating room and placed on the table in the supine position. After an adequate general endotracheal anesthetic was administered she was prepped and draped in the usual sterile manner exposing her entire abdomen.  A proper timeout was performed identifying the patient and the procedure to be performed. An infraumbilical midline incision was made using a #15 blade and taken down to the midline fascia. We entered the midline fascia using a #15 blade then expose the posterior rectus sheath which was subsequently incised along with the peritoneum tenting out with Kocher clamps. We then entered the peritoneal cavity. A pursestring suture of 0 Vicryl was passed around the fascial and peritoneal opening. This is used to secure in place a Hassan cannula which was subsequently used to insufflate the peritoneal cavity up to a maximal intra-abdominal pressure of 15 mm mercury.  A right upper quadrant 5 mm cannula and a left low quadrant 12 mm cannula were passed under direct vision. Once all cannulas were in place the patient was placed in Trendelenburg position, and her left side was tilted down.  The appendix was noted to come off the cecum medially and inferiorly. We'll able to mobilize it adequately using a grasper and subsequently a dissecting forceps. We dissected out the appendix at the base of the cecum separating it from the mesoappendix. We came across the mesoappendix and control the  bleeding with a Harmonic Scalpel. This dissected down the appendix to the base of the cecum with minimal vasculature left. We came across the base of the appendix at the cecum using a blue cartridge Endo GIA stapler. This completely detached the appendix from the cecum.  We retrieved a penny from the left low quadrant cannula site. Once this was done we inspected the perineal cavity for bleeding and none was noted. We aspirated some of the fluid within the pelvis from the acutely inflamed appendix which was not purulent or cloudy and likely represented rate reactive peritoneal fluid.  Once we aspirated all fluid and gas in the perineal cavity we closed the infraumbilical fascial site using a pursestring suture which was in place. We then inspected above the liver where fluid and gas were aspirated and all cannulas were removed.  0.25% Marcaine was injected at all incision sites. We then closed the infraumbilical and the left low quadrant skin sites using a running subcuticular stitch of 40 mitral. Dermabond, Steri-Strips, and Tegaderm she used to complete the dressings. All needle counts, sponge counts, and instrument counts were correct.Marland Kitchen.  PATIENT DISPOSITION:  PACU - hemodynamically stable.   Heather [name], Heather [name] 7/28/20159:00 AM

## 2014-01-13 NOTE — Anesthesia Postprocedure Evaluation (Signed)
  Anesthesia Post-op Note  Patient: Heather [name]  Procedure(s) Performed: Procedure(s): APPENDECTOMY LAPAROSCOPIC (N/A)  Patient Location: PACU  Anesthesia Type:General  Level of Consciousness: awake and alert   Airway and Oxygen Therapy: Patient Spontanous Breathing  Post-op Pain: mild  Post-op Assessment: Post-op Vital signs reviewed and No signs of Nausea or vomiting  Post-op Vital Signs: stable  Last Vitals:  Filed Vitals:   01/13/14 1015  BP:   Pulse: 73  Temp:   Resp: 14    Complications: No apparent anesthesia complications

## 2014-01-14 ENCOUNTER — Encounter (HOSPITAL_COMMUNITY): Payer: Self-pay | Admitting: General Surgery

## 2014-01-14 MED ORDER — OXYCODONE-ACETAMINOPHEN 5-325 MG PO TABS: 1.0000 | ORAL_TABLET | ORAL | Status: DC | PRN

## 2014-01-14 NOTE — Discharge Instructions (Signed)
Your appointment is at 1:45pm on 02/10/14, please arrive at least 30 min before your appointment to complete your check in paperwork.  If you are unable to arrive 30 min prior to your appointment time we may have to cancel or reschedule you.  LAPAROSCOPIC SURGERY: POST OP INSTRUCTIONS  1. DIET: Follow a light bland diet the first 24 hours after arrival home, such as soup, liquids, crackers, etc. Be sure to include lots of fluids daily. Avoid fast food or heavy meals as your are more likely to get nauseated. Eat a low fat the next few days after surgery.  2. Take your usually prescribed home medications unless otherwise directed. 3. PAIN CONTROL:  a. Pain is best controlled by a usual combination of three different methods TOGETHER:  i. Ice/Heat ii. Over the counter pain medication iii. Prescription pain medication b. Most patients will experience some swelling and bruising around the incisions. Ice packs or heating pads (30-60 minutes up to 6 times a day) will help. Use ice for the first few days to help decrease swelling and bruising, then switch to heat to help relax tight/sore spots and speed recovery. Some people prefer to use ice alone, heat alone, alternating between ice & heat. Experiment to what works for you. Swelling and bruising can take several weeks to resolve.  c. It is helpful to take an over-the-counter pain medication regularly for the first few weeks. Choose one of the following that works best for you:  i. Naproxen (Aleve, etc) Two 220mg  tabs twice a day ii. Ibuprofen (Advil, etc) Three 200mg  tabs four times a day (every meal & bedtime) iii. Acetaminophen (Tylenol, etc) 500-650mg  four times a day (every meal & bedtime) d. A prescription for pain medication (such as oxycodone, hydrocodone, etc) should be given to you upon discharge. Take your pain medication as prescribed.  i. If you are having problems/concerns with the prescription medicine (does not control pain, nausea, vomiting,  rash, itching, etc), please call us [phone] to see if we need to switch you to a different pain medicine that will work better for you and/or control your side effect better. ii. If you need a refill on your pain medication, please contact your pharmacy. They will contact our office to request authorization. Prescriptions will not be filled after 5 pm or on week-ends. 4. Avoid getting constipated. Between the surgery and the pain medications, it is common to experience some constipation. Increasing fluid intake and taking a fiber supplement (such as Metamucil, Citrucel, FiberCon, MiraLax, etc) 1-2 times a day regularly will usually help prevent this problem from occurring. A mild laxative (prune juice, Milk of Magnesia, MiraLax, etc) should be taken according to package directions if there are no bowel movements after 48 hours.  5. Watch out for diarrhea. If you have many loose bowel movements, simplify your diet to bland foods & liquids for a few days. Stop any stool softeners and decrease your fiber supplement. Switching to mild anti-diarrheal medications (Kayopectate, Pepto Bismol) can help. If this worsens or does not improve, please call us. 6. Wash / shower every day. You may shower over the dressings as they are waterproof. Continue to shower over incision(s) after the dressing is off. 7. Remove your waterproof bandages 5 days after surgery. You may leave the incision open to air. You may replace a dressing/Band-Aid to cover the incision for comfort if you wish.  8. ACTIVITIES as tolerated:  a. You may resume regular (light) daily activities beginning the next  day--such as daily self-care, walking, climbing stairs--gradually increasing activities as tolerated. If you can walk 30 minutes without difficulty, it is safe to try more intense activity such as jogging, treadmill, bicycling, low-impact aerobics, swimming, etc. b. Save the most intensive and strenuous activity for last such as sit-ups,  heavy lifting, contact sports, etc Refrain from any heavy lifting or straining until you are off narcotics for pain control.  c. DO NOT PUSH THROUGH PAIN. Let pain be your guide: If it hurts to do something, don't do it. Pain is your body warning you to avoid that activity for another week until the pain goes down. d. You may drive when you are no longer taking prescription pain medication, you can comfortably wear a seatbelt, and you can safely maneuver your car and apply brakes. e. Bonita Quin may have sexual intercourse when it is comfortable.  9. FOLLOW UP in our office  a. Please call [facility] at [phone] to set up an appointment to see your surgeon in the office for a follow-up appointment approximately 2-3 weeks after your surgery. b. Make sure that you call for this appointment the day you arrive home to insure a convenient appointment time.      10. IF YOU HAVE DISABILITY OR FAMILY LEAVE FORMS, BRING THEM TO THE               OFFICE FOR PROCESSING.   WHEN TO CALL us [phone]:  1. Poor pain control 2. Reactions / problems with new medications (rash/itching, nausea, etc)  3. Fever over 101.5 F (38.5 C) 4. Inability to urinate 5. Nausea and/or vomiting 6. Worsening swelling or bruising 7. Continued bleeding from incision. 8. Increased pain, redness, or drainage from the incision  The clinic staff is available to answer your questions during regular business hours (8:30am-5pm). Please dont hesitate to call and ask to speak to one of our nurses for clinical concerns.  If you have a medical emergency, go to the nearest emergency room or call 911.  A surgeon from [facility] is always on call at the [facility], [place]  [address], [place], [place] [address] ?  MAIN: [phone] ? TOLL FREE: [phone] ?  FAX [phone]  [email or web address]

## 2014-01-14 NOTE — Progress Notes (Signed)
1 Day Post-Op  Subjective: Patient feeling okay. Reports some pain overnight but seems to be improved this morning. No difficulties urinating. Has not had a BM but passing flatus. Able to ambulate without difficulty. Tolerating full diet. Denies nausea or vomiting. VSS. Afebrile.   Objective: Vital signs in last 24 hours: Temp:  [97.3 F (36.3 C)-98.4 F (36.9 C)] 98.4 F (36.9 C) (07/29 0547) Pulse Rate:  [66-90] 89 (07/29 0547) Resp:  [10-23] 16 (07/29 0547) BP: (89-110)/(44-75) 91/44 mmHg (07/29 0547) SpO2:  [94 %-100 %] 99 % (07/29 0547) Last BM Date: 01/12/14  Intake/Output from previous day: 07/28 0701 - 07/29 0700 In: 2250 [I.V.:2250] Out: 1725 [Urine:1700; Blood:25] Intake/Output this shift:    General appearance: alert, cooperative and no distress Resp: CTAB, no rhochi, rales or wheezes Cardio: RRR, S1, S2 normal, no obvious murmur, click, rub or gallop  GI: soft, ND, appropriately tender around incision sites, +BS, no masses or organomegaly Wound: Incision sites without drainage. Tegaderm clean, dry and intact.   Lab Results:   Recent Labs  01/12/14 1422  WBC 11.5*  HGB 13.5  HCT 38.0  PLT 186   BMET  Recent Labs  01/12/14 1422  NA 137  K 3.8  CL 101  CO2 22  GLUCOSE 120*  BUN 10  CREATININE 0.62  CALCIUM 9.1    Studies/Results: Ct Abdomen Pelvis W Contrast  01/12/2014   CLINICAL DATA:  Abdominal pain and nausea beginning this morning.  EXAM: CT ABDOMEN AND PELVIS WITH CONTRAST  TECHNIQUE: Multidetector CT imaging of the abdomen and pelvis was performed using the standard protocol following bolus administration of intravenous contrast.  CONTRAST:  100 mL OMNIPAQUE IOHEXOL 300 MG/ML  SOLN  COMPARISON:  None.  FINDINGS: Lung bases are clear.  No pleural or pericardial effusion.  The gallbladder, liver, spleen, adrenal glands, pancreas and left kidney appear normal. There is a punctate nonobstructing stone in the upper pole right kidney. Small low  attenuating lesion in the lower pole the right kidney is consistent with a cyst. Small amount of free pelvic fluid is consistent with physiologic change. The appendix is visualized in the right lower quadrant. A appears mildly prominent. A small amount of fluid is seen off the tip of the cecum. The stomach and small and large bowel appear normal. No focal fluid collection is identified. No lymphadenopathy is seen. No focal bony abnormality is identified.  IMPRESSION: Findings suspicious for acute appendicitis. The examination is otherwise negative.   Electronically Signed   By: Drusilla Kannerhomas  Dalessio M.D.   On: 01/12/2014 20:13    Anti-infectives: Anti-infectives   Start     Dose/Rate Route Frequency Ordered Stop   01/12/14 2359  metroNIDAZOLE (FLAGYL) IVPB 500 mg  Status:  Discontinued     500 mg 100 mL/hr over 60 Minutes Intravenous 3 times per day 01/12/14 2319 01/13/14 1046   01/12/14 2359  clindamycin (CLEOCIN) IVPB 900 mg  Status:  Discontinued     900 mg 100 mL/hr over 30 Minutes Intravenous 3 times per day 01/12/14 2319 01/13/14 1046      Assessment/Plan: s/p Procedure(s): APPENDECTOMY LAPAROSCOPIC (N/A), 01/13/2014, Dr. Jimmye NormanJames Wyatt   Acute appendicitis - POD #1, s/p lap appy  TEN - regular diet, d/c IV pain medication and encourage po for pain, encourage ambulation and use of incentive spirometry VTE - SCDs/Lovenox  Dispo - 6N, discuss possibility of d/c home this afternoon if continues to tolerate diet and pain controlled on po medications  LOS: 2 days    Heather [name] 01/14/2014

## 2014-01-14 NOTE — Discharge Summary (Signed)
[facility] Discharge Summary   Patient ID: Heather [name] MRN: [id number] DOB/AGE: 12-06-1985 28 y.o.  Admit date: 01/12/2014 Discharge date: 01/14/2014  Admitting Diagnosis: Acute appendicitis  Discharge Diagnosis Patient Active Problem List   Diagnosis Date Noted  . Appendicitis, acute 01/12/2014    Consultants None  Imaging: Ct Abdomen Pelvis W Contrast  01/12/2014   CLINICAL DATA:  Abdominal pain and nausea beginning this morning.  EXAM: CT ABDOMEN AND PELVIS WITH CONTRAST  TECHNIQUE: Multidetector CT imaging of the abdomen and pelvis was performed using the standard protocol following bolus administration of intravenous contrast.  CONTRAST:  100 mL OMNIPAQUE IOHEXOL 300 MG/ML  SOLN  COMPARISON:  None.  FINDINGS: Lung bases are clear.  No pleural or pericardial effusion.  The gallbladder, liver, spleen, adrenal glands, pancreas and left kidney appear normal. There is a punctate nonobstructing stone in the upper pole right kidney. Small low attenuating lesion in the lower pole the right kidney is consistent with a cyst. Small amount of free pelvic fluid is consistent with physiologic change. The appendix is visualized in the right lower quadrant. A appears mildly prominent. A small amount of fluid is seen off the tip of the cecum. The stomach and small and large bowel appear normal. No focal fluid collection is identified. No lymphadenopathy is seen. No focal bony abnormality is identified.  IMPRESSION: Findings suspicious for acute appendicitis. The examination is otherwise negative.   Electronically Signed   By: Drusilla Kannerhomas  Dalessio M.D.   On: 01/12/2014 20:13    Procedures Dr. Lindie SpruceWyatt (01/13/14) - Laparoscopic Appendectomy  Hospital Course:  28 y/o female with history of undiagnosed chest pain (she has been evaluated fully and [facility]) who presents with right lower quadrant pain for about 12 hours that was worsening and not  getting better at home. She had some n/v, having bms. No fevers.   Workup showed acute appendicitis.  Patient was admitted and underwent procedure listed above.  Tolerated procedure well and was transferred to the floor.  Diet was advanced as tolerated.  On POD #1, the patient was voiding well, tolerating diet, ambulating well, pain well controlled, vital signs stable, incisions c/d/i and felt stable for discharge home.  Patient will follow up in our office in 3 weeks and knows to call with questions or concerns.  Physical Exam: General:  Alert, NAD, pleasant, comfortable Abd:  Soft, ND, mild tenderness, incisions C/D/I    Medication List    STOP taking these medications       sulfamethoxazole-trimethoprim 800-160 MG per tablet  Commonly known as:  BACTRIM DS      TAKE these medications       amitriptyline 50 MG tablet  Commonly known as:  ELAVIL  Take 50 mg by mouth at bedtime.     butalbital-acetaminophen-caffeine 50-325-40 MG per tablet  Commonly known as:  FIORICET, ESGIC  Take 1 tablet by mouth every 6 (six) hours as needed for headache or migraine.     etonogestrel 68 MG Impl implant  Commonly known as:  IMPLANON  Inject 1 each into the skin once.     GAS-X PO  Take 1 tablet by mouth as needed (for gas).     oxyCODONE-acetaminophen 5-325 MG per tablet  Commonly known as:  PERCOCET/ROXICET  Take 1-2 tablets by mouth every 4 (four) hours as needed for moderate pain.     sertraline 100 MG tablet  Commonly known as:  ZOLOFT  Take 100 mg by mouth daily.             Follow-up Information   Follow up with [facility] [place] On 02/10/2014. (For post-operation check. Your appointment is at 1:45pm, please arrive at least 30 min before your appointment to complete your check in paperwork.  If you are unable to arrive 30 min prior to your appointment time we may have to cancel or reschedule you)    Contact information:   [address]   [place]  [address] [phone]       Signed: Candiss Norse [facility] [phone]  01/14/2014, 9:04 AM

## 2014-01-14 NOTE — Progress Notes (Signed)
D/c to home with husband via wheelchair. All post-op and d/c instructions given and patient demonstrated understanding as well as husband.  Oxycodone given at discharge due to discomfort and it may take husband a few hours to have rx's filled.

## 2014-01-14 NOTE — Discharge Summary (Signed)
Joyce GrossKay to go home.  Marta LamasJames O. Gae BonWyatt, III, MD, FACS [phone]--pager [phone]--office [facility]

## 2014-02-05 ENCOUNTER — Other Ambulatory Visit: Payer: Self-pay | Admitting: Obstetrics and Gynecology

## 2014-02-06 LAB — CYTOLOGY - PAP

## 2014-02-10 ENCOUNTER — Ambulatory Visit (INDEPENDENT_AMBULATORY_CARE_PROVIDER_SITE_OTHER): Payer: 59 | Admitting: General Surgery

## 2014-02-10 ENCOUNTER — Encounter (INDEPENDENT_AMBULATORY_CARE_PROVIDER_SITE_OTHER): Payer: Self-pay | Admitting: General Surgery

## 2014-02-10 VITALS — BP 122/78 | HR 80 | Temp 97.5°F | Ht 70.0 in | Wt 160.0 lb

## 2014-02-10 DIAGNOSIS — K3580 Unspecified acute appendicitis: Secondary | ICD-10-CM

## 2014-02-10 NOTE — Progress Notes (Signed)
DEMIRA [place]ide January 26, 1986 [id number] 02/10/2014   History of Present Illness: Heather [name] is a  28 y.o. female who presents today status post lap appy by Dr. Jimmye Norman.  Pathology reveals acute appendicitis and periappendicitis.  The patient is tolerating a regular diet, having normal bowel movements, has good pain control.  She  is back to most normal activities.   Physical Exam: Abd: soft, nontender, active bowel sounds, nondistended.  All incisions are well healed.  Impression: 1.  Acute appendicitis, s/p lap appy  Plan: She  is able to return to normal activities. She  may follow up on a prn basis.

## 2014-02-10 NOTE — Patient Instructions (Signed)
Follow up as needed

## 2014-07-19 ENCOUNTER — Emergency Department (HOSPITAL_COMMUNITY)
Admission: EM | Admit: 2014-07-19 | Discharge: 2014-07-19 | Disposition: A | Discharge status: Home / Self Care | Payer: 59 | Attending: Emergency Medicine | Admitting: Emergency Medicine

## 2014-07-19 ENCOUNTER — Encounter (HOSPITAL_COMMUNITY): Payer: Self-pay | Admitting: *Deleted

## 2014-07-19 ENCOUNTER — Emergency Department (HOSPITAL_COMMUNITY): Payer: 59

## 2014-07-19 DIAGNOSIS — Z3A01 Less than 8 weeks gestation of pregnancy: Secondary | ICD-10-CM | POA: Diagnosis not present

## 2014-07-19 DIAGNOSIS — R531 Weakness: Secondary | ICD-10-CM | POA: Insufficient documentation

## 2014-07-19 DIAGNOSIS — R197 Diarrhea, unspecified: Secondary | ICD-10-CM | POA: Insufficient documentation

## 2014-07-19 DIAGNOSIS — O219 Vomiting of pregnancy, unspecified: Secondary | ICD-10-CM

## 2014-07-19 DIAGNOSIS — Z79899 Other long term (current) drug therapy: Secondary | ICD-10-CM | POA: Insufficient documentation

## 2014-07-19 DIAGNOSIS — Z880 Allergy status to penicillin: Secondary | ICD-10-CM | POA: Insufficient documentation

## 2014-07-19 DIAGNOSIS — O210 Mild hyperemesis gravidarum: Secondary | ICD-10-CM | POA: Insufficient documentation

## 2014-07-19 DIAGNOSIS — O9989 Other specified diseases and conditions complicating pregnancy, childbirth and the puerperium: Secondary | ICD-10-CM | POA: Insufficient documentation

## 2014-07-19 DIAGNOSIS — R109 Unspecified abdominal pain: Secondary | ICD-10-CM

## 2014-07-19 LAB — WET PREP, GENITAL
Clue Cells Wet Prep HPF POC: NONE SEEN
TRICH WET PREP: NONE SEEN
YEAST WET PREP: NONE SEEN

## 2014-07-19 LAB — CBC WITH DIFFERENTIAL/PLATELET
BASOS ABS: 0 10*3/uL (ref 0.0–0.1)
Basophils Relative: 0 % (ref 0–1)
Eosinophils Absolute: 0.1 10*3/uL (ref 0.0–0.7)
Eosinophils Relative: 1 % (ref 0–5)
HEMATOCRIT: 37.5 % (ref 36.0–46.0)
Hemoglobin: 13.6 g/dL (ref 12.0–15.0)
LYMPHS ABS: 2 10*3/uL (ref 0.7–4.0)
Lymphocytes Relative: 27 % (ref 12–46)
MCH: 30.4 pg (ref 26.0–34.0)
MCHC: 36.3 g/dL — ABNORMAL HIGH (ref 30.0–36.0)
MCV: 83.7 fL (ref 78.0–100.0)
Monocytes Absolute: 0.6 10*3/uL (ref 0.1–1.0)
Monocytes Relative: 8 % (ref 3–12)
NEUTROS ABS: 4.7 10*3/uL (ref 1.7–7.7)
NEUTROS PCT: 64 % (ref 43–77)
PLATELETS: 222 10*3/uL (ref 150–400)
RBC: 4.48 MIL/uL (ref 3.87–5.11)
RDW: 12 % (ref 11.5–15.5)
WBC: 7.3 10*3/uL (ref 4.0–10.5)

## 2014-07-19 LAB — COMPREHENSIVE METABOLIC PANEL
ALT: 13 U/L (ref 0–35)
AST: 18 U/L (ref 0–37)
Albumin: 4 g/dL (ref 3.5–5.2)
Alkaline Phosphatase: 34 U/L — ABNORMAL LOW (ref 39–117)
Anion gap: 5 (ref 5–15)
CALCIUM: 9.5 mg/dL (ref 8.4–10.5)
CHLORIDE: 107 mmol/L (ref 96–112)
CO2: 25 mmol/L (ref 19–32)
Creatinine, Ser: 0.64 mg/dL (ref 0.50–1.10)
Glucose, Bld: 101 mg/dL — ABNORMAL HIGH (ref 70–99)
Potassium: 3.6 mmol/L (ref 3.5–5.1)
Sodium: 137 mmol/L (ref 135–145)
TOTAL PROTEIN: 6.3 g/dL (ref 6.0–8.3)
Total Bilirubin: 2.3 mg/dL — ABNORMAL HIGH (ref 0.3–1.2)

## 2014-07-19 LAB — HCG, QUANTITATIVE, PREGNANCY: hCG, Beta Chain, Quant, S: 74807 m[IU]/mL — ABNORMAL HIGH (ref <5)

## 2014-07-19 LAB — URINALYSIS, ROUTINE W REFLEX MICROSCOPIC
BILIRUBIN URINE: NEGATIVE
Glucose, UA: NEGATIVE mg/dL
HGB URINE DIPSTICK: NEGATIVE
Ketones, ur: 15 mg/dL — AB
LEUKOCYTES UA: NEGATIVE
NITRITE: NEGATIVE
PH: 5.5 (ref 5.0–8.0)
Protein, ur: NEGATIVE mg/dL
SPECIFIC GRAVITY, URINE: 1.033 — AB (ref 1.005–1.030)
UROBILINOGEN UA: 1 mg/dL (ref 0.0–1.0)

## 2014-07-19 MED ORDER — PROMETHAZINE HCL 25 MG PO TABS
25.0000 mg | ORAL_TABLET | Freq: Four times a day (QID) | ORAL | Status: DC | PRN
Start: 2014-07-19 — End: 2014-08-12

## 2014-07-19 MED ORDER — VITAMIN B-6 25 MG PO TABS: 25.0000 mg | ORAL_TABLET | Freq: Four times a day (QID) | ORAL | Status: DC | PRN

## 2014-07-19 MED ORDER — SODIUM CHLORIDE 0.9 % IV BOLUS (SEPSIS)
1000.0000 mL | Freq: Once | INTRAVENOUS | Status: AC
  Administered 2014-07-19: 1000 mL via INTRAVENOUS

## 2014-07-19 MED ORDER — PYRIDOXINE HCL 25 MG PO TABS
25.0000 mg | ORAL_TABLET | Freq: Once | ORAL | Status: AC
  Administered 2014-07-19: 25 mg via ORAL
  Filled 2014-07-19: qty 1

## 2014-07-19 MED ORDER — PROMETHAZINE HCL 25 MG RE SUPP: 25.0000 mg | Freq: Four times a day (QID) | RECTAL | Status: DC | PRN

## 2014-07-19 MED ORDER — PROMETHAZINE HCL 25 MG/ML IJ SOLN
25.0000 mg | Freq: Once | INTRAMUSCULAR | Status: AC
  Administered 2014-07-19: 25 mg via INTRAVENOUS
  Filled 2014-07-19: qty 1

## 2014-07-19 NOTE — ED Provider Notes (Signed)
CSN: [id number]     Arrival date & time 07/19/14  1532 History   First MD Initiated Contact with Patient 07/19/14 1615     Chief Complaint  Patient presents with  . Morning Sickness  . Emesis During Pregnancy     (Consider location/radiation/quality/duration/timing/severity/associated sxs/prior Treatment) HPI Comments: Patient reports unable to tolerate by mouth for the past 3 days. She reports she has 6 or [redacted] weeks pregnant by home pregnancy test. Denies any vaginal bleeding or discharge. She has not had an ultrasound this pregnancy but is scheduled for one next week. She's had some lower abdominal soreness that comes and goes but nonsignificant today. She denies any bleeding, discharge or clots. She denies any fever or chills. Denies any urinary symptoms. She has some diarrhea. Her emesis is nonbilious and nonbloody. She denies any chest pain or shortness of breath.  The history is provided by the patient and a relative.    History reviewed. No pertinent past medical history. Past Surgical History  Procedure Laterality Date  . Laparoscopic appendectomy N/A 01/13/2014    Procedure: APPENDECTOMY LAPAROSCOPIC;  Surgeon: Cherylynn RidgesJames O Wyatt, MD;  [facility];  Service: General;  Laterality: N/A;   History reviewed. No pertinent family history. History  Substance Use Topics  . Smoking status: Never Smoker   . Smokeless tobacco: Not on file  . Alcohol Use: Yes   OB History    No data available     Review of Systems  Constitutional: Positive for activity change, appetite change and fatigue. Negative for fever.  HENT: Negative for congestion and rhinorrhea.   Respiratory: Negative for cough, chest tightness and shortness of breath.   Cardiovascular: Negative for chest pain.  Gastrointestinal: Positive for nausea, vomiting, abdominal pain and diarrhea.  Genitourinary: Negative for dysuria, hematuria, vaginal bleeding and vaginal discharge.  Musculoskeletal: Negative for myalgias and  arthralgias.  Skin: Negative for wound.  Neurological: Positive for weakness. Negative for dizziness, light-headedness and headaches.  A complete 10 system review of systems was obtained and all systems are negative except as noted in the HPI and PMH.      Allergies  Penicillins  Home Medications   Prior to Admission medications   Medication Sig Start Date End Date Taking? Authorizing Provider  amitriptyline (ELAVIL) 50 MG tablet Take 50 mg by mouth at bedtime.    Historical Provider, MD  butalbital-acetaminophen-caffeine (FIORICET, ESGIC) 50-325-40 MG per tablet Take 1 tablet by mouth every 6 (six) hours as needed for headache or migraine.    Historical Provider, MD  etonogestrel (IMPLANON) 68 MG IMPL implant Inject 1 each into the skin once.    Historical Provider, MD  promethazine (PHENERGAN) 25 MG suppository Place 1 suppository (25 mg total) rectally every 6 (six) hours as needed for nausea or vomiting. 07/19/14   Glynn OctaveStephen Solina Heron, MD  promethazine (PHENERGAN) 25 MG tablet Take 1 tablet (25 mg total) by mouth every 6 (six) hours as needed for nausea or vomiting. 07/19/14   Glynn OctaveStephen Roselynne Lortz, MD  sertraline (ZOLOFT) 100 MG tablet Take 100 mg by mouth daily.    Historical Provider, MD  Simethicone (GAS-X PO) Take 1 tablet by mouth as needed (for gas).    Historical Provider, MD  vitamin B-6 (PYRIDOXINE) 25 MG tablet Take 1 tablet (25 mg total) by mouth 4 (four) times daily as needed (nausea). 07/19/14   Glynn OctaveStephen Daniyla Pfahler, MD   BP 100/81 mmHg  Pulse 88  Temp(Src) 97.8 F (36.6 C) (Oral)  Resp 16  SpO2  99% Physical Exam  Constitutional: She is oriented to person, place, and time. She appears well-developed and well-nourished. No distress.  HENT:  Head: Normocephalic and atraumatic.  Mouth/Throat: Oropharynx is clear and moist. No oropharyngeal exudate.  Eyes: Conjunctivae and EOM are normal. Pupils are equal, round, and reactive to light.  Neck: Normal range of motion. Neck supple.  No  meningismus.  Cardiovascular: Normal rate, regular rhythm, normal heart sounds and intact distal pulses.   No murmur heard. Pulmonary/Chest: Effort normal and breath sounds normal. No respiratory distress.  Abdominal: Soft. There is tenderness. There is no rebound and no guarding.  Mild lower abdominal tenderness  Genitourinary: Guaiac positive stool.  Chaperone present.  Mild suprapubic tenderness. No CMT, no lateralizing adnexal tenderness. Scant white vaginal discharge  Musculoskeletal: Normal range of motion. She exhibits no edema or tenderness.  No CVAT  Neurological: She is alert and oriented to person, place, and time. No cranial nerve deficit. She exhibits normal muscle tone. Coordination normal.  No ataxia on finger to nose bilaterally. No pronator drift. 5/5 strength throughout. CN 2-12 intact. Negative Romberg. Equal grip strength. Sensation intact. Gait is normal.   Skin: Skin is warm.  Psychiatric: She has a normal mood and affect. Her behavior is normal.  Nursing note and vitals reviewed.   ED Course  Procedures (including critical care time) Labs Review Labs Reviewed  WET PREP, GENITAL - Abnormal; Notable for the following:    WBC, Wet Prep HPF POC FEW (*)    All other components within normal limits  CBC WITH DIFFERENTIAL/PLATELET - Abnormal; Notable for the following:    MCHC 36.3 (*)    All other components within normal limits  COMPREHENSIVE METABOLIC PANEL - Abnormal; Notable for the following:    Glucose, Bld 101 (*)    BUN <5 (*)    Alkaline Phosphatase 34 (*)    Total Bilirubin 2.3 (*)    All other components within normal limits  HCG, QUANTITATIVE, PREGNANCY - Abnormal; Notable for the following:    hCG, Beta Chain, [place] [address] (*)    All other components within normal limits  URINALYSIS, ROUTINE W REFLEX MICROSCOPIC - Abnormal; Notable for the following:    Color, Urine AMBER (*)    APPearance CLOUDY (*)    Specific Gravity, Urine 1.033 (*)     Ketones, ur 15 (*)    All other components within normal limits  GC/CHLAMYDIA PROBE AMP ()    Imaging Review US Ob Comp Less 14 Wks  07/19/2014   CLINICAL DATA:  Morning sickness. Abdominal pain. Pregnancy. Quantitative beta HCG 74,800  EXAM: OBSTETRIC <14 WK [place] AND TRANSVAGINAL OB US  TECHNIQUE: Both transabdominal and transvaginal ultrasound examinations were performed for complete evaluation of the gestation as well as the maternal uterus, adnexal regions, and pelvic cul-de-sac. Transvaginal technique was performed to assess early pregnancy.  COMPARISON:  CT 01/12/2014.  FINDINGS: Intrauterine gestational sac: Visualized/normal in shape.  Yolk sac:  Present  Embryo:  Present  Cardiac Activity: Present  Heart Rate:  124 bpm  CRL:   5.6  mm   6 w 2 d                  [place] EDC: 03/12/2015  Maternal uterus/adnexae: Physiologic appearance of the uterus and adnexa. 26 mm cyst in the RIGHT ovary, likely representing a corpus luteum cyst. Small amount of free fluid in the anatomic pelvis. No subchorionic hemorrhage.  IMPRESSION: Uncomplicated single intrauterine pregnancy.  Electronically Signed   By: Andreas Newport M.D.   On: 07/19/2014 19:15   US Ob Transvaginal  07/19/2014   CLINICAL DATA:  Morning sickness. Abdominal pain. Pregnancy. Quantitative beta HCG 74,800  EXAM: OBSTETRIC <14 WK [place] AND TRANSVAGINAL OB US  TECHNIQUE: Both transabdominal and transvaginal ultrasound examinations were performed for complete evaluation of the gestation as well as the maternal uterus, adnexal regions, and pelvic cul-de-sac. Transvaginal technique was performed to assess early pregnancy.  COMPARISON:  CT 01/12/2014.  FINDINGS: Intrauterine gestational sac: Visualized/normal in shape.  Yolk sac:  Present  Embryo:  Present  Cardiac Activity: Present  Heart Rate:  124 bpm  CRL:   5.6  mm   6 w 2 d                  [place] EDC: 03/12/2015  Maternal uterus/adnexae: Physiologic appearance of the uterus and adnexa. 26 mm  cyst in the RIGHT ovary, likely representing a corpus luteum cyst. Small amount of free fluid in the anatomic pelvis. No subchorionic hemorrhage.  IMPRESSION: Uncomplicated single intrauterine pregnancy.   Electronically Signed   By: Andreas Newport M.D.   On: 07/19/2014 19:15     EKG Interpretation None      MDM   Final diagnoses:  Nausea and vomiting in pregnancy   Nausea and vomiting in setting of pregnancy. Unable to tolerate by mouth today. No fever or urinary symptoms. Mild lower abdominal soreness. No bleeding.  Pelvic exam benign. Given vague lower abdominal tenderness we'll check ultrasound to rule out ectopic. Treat with IV fluids, antiemetics  Ultrasounds shows IUP at [redacted] weeks gestation. No complicating features. Urinalysis negative.  Patient is tolerating by mouth in the ED and is feeling better. She'll be discharged with by mouth and rectal Phenergan as well as pyridoxine. Follow up with OB this week as scheduled. Return precautions discussed  Glynn Octave, MD 07/20/14 [phone]

## 2014-07-19 NOTE — Discharge Instructions (Signed)
Nausea and Vomiting [facility]. Take the nausea medications as prescribed. Return to the ED if you develop new or worsening symptoms. Nausea is a sick feeling that often comes before throwing up (vomiting). Vomiting is a reflex where stomach contents come out of your mouth. Vomiting can cause severe loss of body fluids (dehydration). Children and elderly adults can become dehydrated quickly, especially if they also have diarrhea. Nausea and vomiting are symptoms of a condition or disease. It is important to find the cause of your symptoms. CAUSES   Direct irritation of the stomach lining. This irritation can result from increased acid production (gastroesophageal reflux disease), infection, food poisoning, taking certain medicines (such as nonsteroidal anti-inflammatory drugs), alcohol use, or tobacco use.  Signals from the brain.These signals could be caused by a headache, heat exposure, an inner ear disturbance, increased pressure in the brain from injury, infection, a tumor, or a concussion, pain, emotional stimulus, or metabolic problems.  An obstruction in the gastrointestinal tract (bowel obstruction).  Illnesses such as diabetes, hepatitis, gallbladder problems, appendicitis, kidney problems, cancer, sepsis, atypical symptoms of a heart attack, or eating disorders.  Medical treatments such as chemotherapy and radiation.  Receiving medicine that makes you sleep (general anesthetic) during surgery. DIAGNOSIS Your caregiver may ask for tests to be done if the problems do not improve after a few days. Tests may also be done if symptoms are severe or if the reason for the nausea and vomiting is not clear. Tests may include:  Urine tests.  Blood tests.  Stool tests.  Cultures (to look for evidence of infection).  X-rays or other imaging studies. Test results can help your caregiver make decisions about treatment or the need for additional  tests. TREATMENT You need to stay well hydrated. Drink frequently but in small amounts.You may wish to drink water, sports drinks, clear broth, or eat frozen ice pops or gelatin dessert to help stay hydrated.When you eat, eating slowly may help prevent nausea.There are also some antinausea medicines that may help prevent nausea. HOME CARE INSTRUCTIONS   Take all medicine as directed by your caregiver.  If you do not have an appetite, do not force yourself to eat. However, you must continue to drink fluids.  If you have an appetite, eat a normal diet unless your caregiver tells you differently.  Eat a variety of complex carbohydrates (rice, wheat, potatoes, bread), lean meats, yogurt, fruits, and vegetables.  Avoid high-fat foods because they are more difficult to digest.  Drink enough water and fluids to keep your urine clear or pale yellow.  If you are dehydrated, ask your caregiver for specific rehydration instructions. Signs of dehydration may include:  Severe thirst.  Dry lips and mouth.  Dizziness.  Dark urine.  Decreasing urine frequency and amount.  Confusion.  Rapid breathing or pulse. SEEK IMMEDIATE MEDICAL CARE IF:   You have blood or brown flecks (like coffee grounds) in your vomit.  You have black or bloody stools.  You have a severe headache or stiff neck.  You are confused.  You have severe abdominal pain.  You have chest pain or trouble breathing.  You do not urinate at least once every 8 hours.  You develop cold or clammy skin.  You continue to vomit for longer than 24 to 48 hours.  You have a fever. MAKE SURE YOU:   Understand these instructions.  Will watch your condition.  Will get help right away if you are not doing  well or get worse. Document Released: 06/05/2005 Document Revised: 08/28/2011 Document Reviewed: 11/02/2010 [facility] Patient Information 2015 [place], [place]. This information is not intended to replace advice given to  you by your health care provider. Make sure you discuss any questions you have with your health care provider.

## 2014-07-19 NOTE — ED Notes (Signed)
Pt reports morning sickness for the past three days. Pt is six weeks pregnant. Pt reports emesis 6-8 yesterday. Denies fevers, chills, diarrhea, dysuria, abdominal pain, denies bleeding, odors, discharge. Pt reports normal pregnancy so far. Pt's first prenatal check up in on Thursday.

## 2014-07-20 LAB — GC/CHLAMYDIA PROBE AMP (~~LOC~~) NOT AT ARMC
Chlamydia: NEGATIVE
Neisseria Gonorrhea: NEGATIVE

## 2014-07-29 ENCOUNTER — Inpatient Hospital Stay (HOSPITAL_COMMUNITY)
Admission: AD | Admit: 2014-07-29 | Discharge: 2014-07-29 | Disposition: A | Discharge status: Home / Self Care | Payer: 59 | Source: Ambulatory Visit | Attending: Obstetrics and Gynecology | Admitting: Obstetrics and Gynecology

## 2014-07-29 ENCOUNTER — Encounter (HOSPITAL_COMMUNITY): Payer: Self-pay | Admitting: *Deleted

## 2014-07-29 DIAGNOSIS — Z3A08 8 weeks gestation of pregnancy: Secondary | ICD-10-CM | POA: Insufficient documentation

## 2014-07-29 DIAGNOSIS — O218 Other vomiting complicating pregnancy: Secondary | ICD-10-CM

## 2014-07-29 DIAGNOSIS — O210 Mild hyperemesis gravidarum: Secondary | ICD-10-CM | POA: Diagnosis present

## 2014-07-29 DIAGNOSIS — O211 Hyperemesis gravidarum with metabolic disturbance: Secondary | ICD-10-CM | POA: Diagnosis not present

## 2014-07-29 HISTORY — DX: Headache: R51

## 2014-07-29 HISTORY — DX: Headache, unspecified: R51.9

## 2014-07-29 LAB — URINALYSIS, ROUTINE W REFLEX MICROSCOPIC
Glucose, UA: NEGATIVE mg/dL
Hgb urine dipstick: NEGATIVE
Ketones, ur: >80 mg/dL — AB
LEUKOCYTES UA: NEGATIVE
Nitrite: NEGATIVE
PH: 6 (ref 5.0–8.0)
Protein, ur: 30 mg/dL — AB
Urobilinogen, UA: 0.2 mg/dL (ref 0.0–1.0)

## 2014-07-29 LAB — COMPREHENSIVE METABOLIC PANEL
ALK PHOS: 34 U/L — AB (ref 39–117)
ALT: 11 U/L (ref 0–35)
ANION GAP: 9 (ref 5–15)
AST: 19 U/L (ref 0–37)
Albumin: 4.8 g/dL (ref 3.5–5.2)
BUN: 9 mg/dL (ref 6–23)
CALCIUM: 10.1 mg/dL (ref 8.4–10.5)
CHLORIDE: 107 mmol/L (ref 96–112)
CO2: 23 mmol/L (ref 19–32)
Creatinine, Ser: 0.59 mg/dL (ref 0.50–1.10)
GFR calc Af Amer: >90 mL/min (ref >90)
GFR calc non Af Amer: >90 mL/min (ref >90)
GLUCOSE: 86 mg/dL (ref 70–99)
Potassium: 3.6 mmol/L (ref 3.5–5.1)
SODIUM: 139 mmol/L (ref 135–145)
TOTAL PROTEIN: 7.5 g/dL (ref 6.0–8.3)
Total Bilirubin: 2.2 mg/dL — ABNORMAL HIGH (ref 0.3–1.2)

## 2014-07-29 LAB — URINE MICROSCOPIC-ADD ON

## 2014-07-29 LAB — CBC
HCT: 38.5 % (ref 36.0–46.0)
Hemoglobin: 14.2 g/dL (ref 12.0–15.0)
MCH: 30.5 pg (ref 26.0–34.0)
MCHC: 36.9 g/dL — AB (ref 30.0–36.0)
MCV: 82.6 fL (ref 78.0–100.0)
Platelets: 221 10*3/uL (ref 150–400)
RBC: 4.66 MIL/uL (ref 3.87–5.11)
RDW: 12.2 % (ref 11.5–15.5)
WBC: 8.4 10*3/uL (ref 4.0–10.5)

## 2014-07-29 LAB — POCT PREGNANCY, URINE: PREG TEST UR: POSITIVE — AB

## 2014-07-29 MED ORDER — ONDANSETRON 4 MG PO TBDP
4.0000 mg | ORAL_TABLET | Freq: Three times a day (TID) | ORAL | Status: DC | PRN
Start: 2014-07-29 — End: 2014-08-15

## 2014-07-29 MED ORDER — METOCLOPRAMIDE HCL 5 MG/ML IJ SOLN
10.0000 mg | Freq: Once | INTRAMUSCULAR | Status: AC
  Administered 2014-07-29: 10 mg via INTRAVENOUS
  Filled 2014-07-29: qty 2

## 2014-07-29 MED ORDER — M.V.I. ADULT IV INJ
Freq: Once | INTRAVENOUS | Status: AC
  Administered 2014-07-29: 15:00:00 via INTRAVENOUS
  Filled 2014-07-29: qty 1000

## 2014-07-29 MED ORDER — PROMETHAZINE HCL 25 MG/ML IJ SOLN
25.0000 mg | Freq: Once | INTRAMUSCULAR | Status: AC
  Administered 2014-07-29: 25 mg via INTRAVENOUS
  Filled 2014-07-29: qty 1

## 2014-07-29 MED ORDER — PROMETHAZINE HCL 25 MG PO TABS: 25.0000 mg | ORAL_TABLET | Freq: Four times a day (QID) | ORAL | Status: DC | PRN

## 2014-07-29 NOTE — MAU Provider Note (Signed)
History     CSN: [id number]  Arrival date and time: 07/29/14 1142   First Provider Initiated Contact with Patient 07/29/14 1236      Chief Complaint  Patient presents with  . Nausea  . Morning Sickness   HPI   [name] is a 29 y.o. female G1P0 at 82w0dwho presents with N/V that has been going on for weeks. She is currently taking decligis, however does not feel that it is working anymore.   She has a history of anxiety and has stopped her medication since she became pregnant.  She was taking zoloft and has stopped and may be interested starting it again.  She would like to talk to her PCP about this.  Patient states she has lost 12lbs in 2-3 weeks due to excessive vomiting. She feels that the N/V is causing her anxiety to heighten.    OB History    Gravida Para Term Preterm AB TAB SAB Ectopic Multiple Living   1               Past Medical History  Diagnosis Date  . Chest pain   . Headache     Past Surgical History  Procedure Laterality Date  . Laparoscopic appendectomy N/A 01/13/2014    Procedure: APPENDECTOMY LAPAROSCOPIC;  Surgeon: JGwenyth Ober MD;  Location: [facility]  Service: General;  Laterality: N/A;  . Tooth extraction      Family History  Problem Relation Age of Onset  . Hypertension Mother   . Diabetes Mother     History  Substance Use Topics  . Smoking status: Never Smoker   . Smokeless tobacco: Not on file  . Alcohol Use: Yes    Allergies:  Allergies  Allergen Reactions  . Penicillins Other (See Comments)    Unknown reaction; precaution; entire family allergic    Prescriptions prior to admission  Medication Sig Dispense Refill Last Dose  . Doxylamine-Pyridoxine 10-10 MG TBEC Take 1 tablet by mouth 3 (three) times daily. Patient takes 2 tablets in the morning and 1 in the evening for nausea   07/29/2014 at Unknown time  . promethazine (PHENERGAN) 25 MG suppository Place 1 suppository (25 mg total) rectally every 6 (six) hours as  needed for nausea or vomiting. 12 each 0 Past Week at Unknown time  . promethazine (PHENERGAN) 25 MG tablet Take 1 tablet (25 mg total) by mouth every 6 (six) hours as needed for nausea or vomiting. 30 tablet 0 Past Week at Unknown time  . Simethicone (GAS-X PO) Take 1 tablet by mouth as needed (for gas).   Taking  . vitamin B-6 (PYRIDOXINE) 25 MG tablet Take 1 tablet (25 mg total) by mouth 4 (four) times daily as needed (nausea). (Patient not taking: Reported on 07/29/2014) 30 tablet 0     Results for orders placed or performed during the hospital encounter of 07/29/14 (from the past 48 hour(s))  Urinalysis, Routine w reflex microscopic     Status: Abnormal   Collection Time: 07/29/14 11:50 AM  Result Value Ref Range   Color, Urine YELLOW YELLOW   APPearance CLOUDY (A) CLEAR   Specific Gravity, Urine >1.030 (H) 1.005 - 1.030   pH 6.0 5.0 - 8.0   Glucose, UA NEGATIVE NEGATIVE mg/dL   Hgb urine dipstick NEGATIVE NEGATIVE   Bilirubin Urine SMALL (A) NEGATIVE   Ketones, ur >80 (A) NEGATIVE mg/dL   Protein, ur 30 (A) NEGATIVE mg/dL   Urobilinogen, UA 0.2 0.0 - 1.0  mg/dL   Nitrite NEGATIVE NEGATIVE   Leukocytes, UA NEGATIVE NEGATIVE  Urine microscopic-add on     Status: Abnormal   Collection Time: 07/29/14 11:50 AM  Result Value Ref Range   Squamous Epithelial / LPF MANY (A) RARE   WBC, UA 3-6 <3 WBC/hpf   RBC / HPF 0-2 <3 RBC/hpf   Bacteria, UA MANY (A) RARE   Urine-Other MUCOUS PRESENT   Pregnancy, urine POC     Status: Abnormal   Collection Time: 07/29/14 12:10 PM  Result Value Ref Range   Preg Test, Ur POSITIVE (A) NEGATIVE    Comment:        THE SENSITIVITY OF THIS METHODOLOGY IS >24 mIU/mL   CBC     Status: Abnormal   Collection Time: 07/29/14 12:55 PM  Result Value Ref Range   WBC 8.4 4.0 - 10.5 K/uL   RBC 4.66 3.87 - 5.11 MIL/uL   Hemoglobin 14.2 12.0 - 15.0 g/dL    Comment: REPEATED TO VERIFY   HCT 38.5 36.0 - 46.0 %   MCV 82.6 78.0 - 100.0 fL   MCH 30.5 26.0 - 34.0  pg    Comment: REPEATED TO VERIFY   MCHC 36.9 (H) 30.0 - 36.0 g/dL    Comment: REPEATED TO VERIFY   RDW 12.2 11.5 - 15.5 %   Platelets 221 150 - 400 K/uL  Comprehensive metabolic panel     Status: Abnormal   Collection Time: 07/29/14 12:55 PM  Result Value Ref Range   Sodium 139 135 - 145 mmol/L   Potassium 3.6 3.5 - 5.1 mmol/L   Chloride 107 96 - 112 mmol/L   CO2 23 19 - 32 mmol/L   Glucose, Bld 86 70 - 99 mg/dL   BUN 9 6 - 23 mg/dL   Creatinine, Ser 0.59 0.50 - 1.10 mg/dL   Calcium 10.1 8.4 - 10.5 mg/dL   Total Protein 7.5 6.0 - 8.3 g/dL   Albumin 4.8 3.5 - 5.2 g/dL   AST 19 0 - 37 U/L   ALT 11 0 - 35 U/L   Alkaline Phosphatase 34 (L) 39 - 117 U/L   Total Bilirubin 2.2 (H) 0.3 - 1.2 mg/dL   GFR calc non Af Amer >90 >90 mL/min   GFR calc Af Amer >90 >90 mL/min    Comment: (NOTE) The eGFR has been calculated using the CKD EPI equation. This calculation has not been validated in all clinical situations. eGFR's persistently <90 mL/min signify possible Chronic Kidney Disease.    Anion gap 9 5 - 15    Review of Systems  Constitutional: Negative for fever and chills.  Gastrointestinal: Positive for nausea and vomiting. Negative for heartburn and abdominal pain.  Genitourinary: Negative for dysuria, urgency, frequency and hematuria.  Neurological: Negative for headaches.   Physical Exam   Blood pressure 111/83, pulse 97, temperature 98.6 F (37 C), temperature source Oral, resp. rate 18, height $RemoveBe'5\' 9"'[id number]$  (1.753 m), weight 67.132 kg (148 lb), last menstrual period 12/15/2013.  Physical Exam  Constitutional: She is oriented to person, place, and time. She appears well-developed and well-nourished. No distress.  HENT:  Head: Normocephalic.  Eyes: Pupils are equal, round, and reactive to light.  Neck: Neck supple.  Cardiovascular: Normal rate.   Respiratory: Effort normal and breath sounds normal.  GI: Soft. She exhibits no distension. There is no tenderness. There is no  rebound.  Musculoskeletal: Normal range of motion.  Neurological: She is alert and oriented to person, place, and  time.  Skin: Skin is warm. She is not diaphoretic.  Psychiatric: Her behavior is normal. She does not express inappropriate judgment. She expresses no suicidal ideation.    MAU Course  Procedures  None  MDM UA shows severe dehydration CBC CMET  D5LR with phenergan Multivitamin bag given Discussed patient with Dr. Gaetano Net  I discussed with the patient the risk of zofran use in the first trimester of pregnancy. Discussed using zofran in the first trimester with Dr. Gaetano Net. The patient and Dr. Gaetano Net both agree that at this time the benefits out weigh the risks.   Assessment and Plan   A:  1. Hyperemesis affecting pregnancy, antepartum    P:  Discharge home in stable condition RX: phenergan, zofran Return to MAU as needed, if symptoms worsen Follow up with Dr. Gaetano Net as scheduled  Small, frequent meals. Discuss anxiety with PCP  Darrelyn Hillock Rasch, NP 07/29/2014 5:16 PM

## 2014-07-29 NOTE — MAU Note (Signed)
C/o N&V for past 2 weeks; has OB and has rxs for nausea but pt continues to vomit; c/o weakness and dizziness;

## 2014-07-29 NOTE — Discharge Instructions (Signed)
Eating Plan for Hyperemesis Gravidarum Severe cases of hyperemesis gravidarum can lead to dehydration and malnutrition. The hyperemesis eating plan is one way to lessen the symptoms of nausea and vomiting. It is often used with prescribed medicines to control your symptoms.  WHAT CAN I DO TO RELIEVE MY SYMPTOMS? Listen to your body. Everyone is different and has different preferences. Find what works best for you. Some of the following things may help:  Eat and drink slowly.  Eat 5-6 small meals daily instead of 3 large meals.   Eat crackers before you get out of bed in the morning.   Starchy foods are usually well tolerated (such as cereal, toast, bread, potatoes, pasta, rice, and pretzels).   Ginger may help with nausea. Add  tsp ground ginger to hot tea or choose ginger tea.   Try drinking 100% fruit juice or an electrolyte drink.  Continue to take your prenatal vitamins as directed by your health care provider. If you are having trouble taking your prenatal vitamins, talk with your health care provider about different options.  Include at least 1 serving of protein with your meals and snacks (such as meats or poultry, beans, nuts, eggs, or yogurt). Try eating a protein-rich snack before bed (such as cheese and crackers or a half [place] or peanut butter sandwich). WHAT THINGS SHOULD I AVOID TO REDUCE MY SYMPTOMS? The following things may help reduce your symptoms:  Avoid foods with strong smells. Try eating meals in well-ventilated areas that are free of odors.  Avoid drinking water or other beverages with meals. Try not to drink anything less than 30 minutes before and after meals.  Avoid drinking more than 1 cup of fluid at a time.  Avoid fried or high-fat foods, such as butter and cream sauces.  Avoid spicy foods.  Avoid skipping meals the best you can. Nausea can be more intense on an empty stomach. If you cannot tolerate food at that time, do not force it. Try sucking on  ice chips or other frozen items and make up the calories later.  Avoid lying down within 2 hours after eating. Document Released: 04/02/2007 Document Revised: 06/10/2013 Document Reviewed: 04/09/2013 [facility] Patient Information 2015 [place], [place]. This information is not intended to replace advice given to you by your health care provider. Make sure you discuss any questions you have with your health care provider.  Hyperemesis Gravidarum Hyperemesis gravidarum is a severe form of nausea and vomiting that happens during pregnancy. Hyperemesis is worse than morning sickness. It may cause you to have nausea or vomiting all day for many days. It may keep you from eating and drinking enough food and liquids. Hyperemesis usually occurs during the first half (the first 20 weeks) of pregnancy. It often goes away once a woman is in her second half of pregnancy. However, sometimes hyperemesis continues through an entire pregnancy.  CAUSES  The cause of this condition is not completely known but is thought to be related to changes in the body's hormones when pregnant. It could be from the high level of the pregnancy hormone or an increase in estrogen in the body.  SIGNS AND SYMPTOMS   Severe nausea and vomiting.  Nausea that does not go away.  Vomiting that does not allow you to keep any food down.  Weight loss and body fluid loss (dehydration).  Having no desire to eat or not liking food you have previously enjoyed. DIAGNOSIS  Your health care provider will do a physical exam  and ask you about your symptoms. He or she may also order blood tests and urine tests to make sure something else is not causing the problem.  °TREATMENT  °You may only need medicine to control the problem. If medicines do not control the nausea and vomiting, you will be treated in the hospital to prevent dehydration, increased acid in the blood (acidosis), weight loss, and changes in the electrolytes in your body that may harm  the unborn baby (fetus). You may need IV fluids.  °HOME CARE INSTRUCTIONS  °· Only take over-the-counter or prescription medicines as directed by your health care provider. °· Try eating a couple of dry crackers or toast in the morning before getting out of bed. °· Avoid foods and smells that upset your stomach. °· Avoid fatty and spicy foods. °· Eat 5-6 small meals a day. °· Do not drink when eating meals. Drink between meals. °· For snacks, eat high-protein foods, such as cheese. °· Eat or suck on things that have ginger in them. Ginger helps nausea. °· Avoid food preparation. The smell of food can spoil your appetite. °· Avoid iron pills and iron in your multivitamins until after 3-4 months of being pregnant. However, consult with your health care provider before stopping any prescribed iron pills. °SEEK MEDICAL CARE IF:  °· Your abdominal pain increases. °· You have a severe headache. °· You have vision problems. °· You are losing weight. °SEEK IMMEDIATE MEDICAL CARE IF:  °· You are unable to keep fluids down. °· You vomit blood. °· You have constant nausea and vomiting. °· You have excessive weakness. °· You have extreme thirst. °· You have dizziness or fainting. °· You have a fever or persistent symptoms for more than 2-3 days. °· You have a fever and your symptoms suddenly get worse. °MAKE SURE YOU:  °· Understand these instructions. °· Will watch your condition. °· Will get help right away if you are not doing well or get worse. °Document Released: 06/05/2005 Document Revised: 03/26/2013 Document Reviewed: 01/15/2013 °[employer]® Patient Information ©2015 [employer]. This information is not intended to replace advice given to you by your health care provider. Make sure you discuss any questions you have with your health care provider. ° °

## 2014-08-12 ENCOUNTER — Encounter (HOSPITAL_COMMUNITY): Payer: Self-pay | Admitting: *Deleted

## 2014-08-12 ENCOUNTER — Inpatient Hospital Stay (HOSPITAL_COMMUNITY)
Admission: AD | Admit: 2014-08-12 | Discharge: 2014-08-12 | Disposition: A | Discharge status: Home / Self Care | Payer: 59 | Source: Ambulatory Visit | Attending: Obstetrics and Gynecology | Admitting: Obstetrics and Gynecology

## 2014-08-12 ENCOUNTER — Other Ambulatory Visit: Payer: Self-pay | Admitting: Obstetrics and Gynecology

## 2014-08-12 DIAGNOSIS — O210 Mild hyperemesis gravidarum: Secondary | ICD-10-CM | POA: Diagnosis present

## 2014-08-12 DIAGNOSIS — Z3A10 10 weeks gestation of pregnancy: Secondary | ICD-10-CM | POA: Diagnosis not present

## 2014-08-12 DIAGNOSIS — O219 Vomiting of pregnancy, unspecified: Secondary | ICD-10-CM

## 2014-08-12 LAB — URINALYSIS, ROUTINE W REFLEX MICROSCOPIC
Glucose, UA: NEGATIVE mg/dL
Hgb urine dipstick: NEGATIVE
KETONES UR: 15 mg/dL — AB
LEUKOCYTES UA: NEGATIVE
NITRITE: NEGATIVE
Protein, ur: 30 mg/dL — AB
UROBILINOGEN UA: 0.2 mg/dL (ref 0.0–1.0)
pH: 6 (ref 5.0–8.0)

## 2014-08-12 LAB — URINE MICROSCOPIC-ADD ON

## 2014-08-12 MED ORDER — SODIUM CHLORIDE 0.9 % IV SOLN
25.0000 mg | Freq: Once | INTRAVENOUS | Status: AC
  Administered 2014-08-12: 25 mg via INTRAVENOUS
  Filled 2014-08-12: qty 1

## 2014-08-12 MED ORDER — LACTATED RINGERS IV SOLN: Freq: Once | INTRAVENOUS | Status: DC

## 2014-08-12 MED ORDER — LACTATED RINGERS IV SOLN
INTRAVENOUS | Status: DC
  Administered 2014-08-12: 21:00:00 via INTRAVENOUS

## 2014-08-12 NOTE — MAU Provider Note (Signed)
History     CSN: [id number]  Arrival date and time: 08/12/14 1623   None     Chief Complaint  Patient presents with  . Emesis During Pregnancy   HPI  29 y.o.G1P0@[redacted]w[redacted]d  presents to the MAU from her doctors office to get IV fluids. Denies vaginal bleeding and pelvic pain.     Past Medical History  Diagnosis Date  . Chest pain   . Headache     Past Surgical History  Procedure Laterality Date  . Laparoscopic appendectomy N/A 01/13/2014    Procedure: APPENDECTOMY LAPAROSCOPIC;  Surgeon: Cherylynn RidgesJames O Wyatt, MD;  [facility];  Service: General;  Laterality: N/A;  . Tooth extraction      Family History  Problem Relation Age of Onset  . Hypertension Mother   . Diabetes Mother     History  Substance Use Topics  . Smoking status: Never Smoker   . Smokeless tobacco: Not on file  . Alcohol Use: No    Allergies:  Allergies  Allergen Reactions  . Penicillins Other (See Comments)    Unknown reaction; precaution; entire family allergic    No prescriptions prior to admission    Review of Systems  Constitutional: Negative for fever and chills.  HENT: Negative.   Eyes: Negative.   Respiratory: Negative.   Cardiovascular: Negative for chest pain.  Gastrointestinal: Positive for nausea and vomiting.  Neurological: Negative.   Endo/Heme/Allergies: Negative.   Psychiatric/Behavioral: Negative.    Physical Exam   Blood pressure 97/73, pulse 85, temperature 97.7 F (36.5 C), temperature source Oral, resp. rate 16, height 5\' 9"  (1.753 m), weight 147 lb 6 oz (66.849 kg), last menstrual period 12/15/2013, SpO2 99 %.  Physical Exam  Constitutional: She is oriented to person, place, and time. She appears well-developed and well-nourished. No distress.  HENT:  Head: Normocephalic and atraumatic.  Neck: Normal range of motion.  Cardiovascular: Normal rate.   Respiratory: Effort normal. No respiratory distress.  GI: Soft. She exhibits no distension and no mass. There is no  tenderness. There is no rebound and no guarding.  Musculoskeletal: Normal range of motion.  Neurological: She is alert and oriented to person, place, and time.  Skin: Skin is warm and dry.  Psychiatric: She has a normal mood and affect. Her behavior is normal. Judgment and thought content normal.   Results for orders placed or performed during the hospital encounter of 08/12/14 (from the past 24 hour(s))  Urinalysis, Routine w reflex microscopic     Status: Abnormal   Collection Time: 08/12/14  4:45 PM  Result Value Ref Range   Color, Urine ORANGE (A) YELLOW   APPearance HAZY (A) CLEAR   Specific Gravity, Urine >1.030 (H) 1.005 - 1.030   pH 6.0 5.0 - 8.0   Glucose, UA NEGATIVE NEGATIVE mg/dL   Hgb urine dipstick NEGATIVE NEGATIVE   Bilirubin Urine SMALL (A) NEGATIVE   Ketones, ur 15 (A) NEGATIVE mg/dL   Protein, ur 30 (A) NEGATIVE mg/dL   Urobilinogen, UA 0.2 0.0 - 1.0 mg/dL   Nitrite NEGATIVE NEGATIVE   Leukocytes, UA NEGATIVE NEGATIVE  Urine microscopic-add on     Status: Abnormal   Collection Time: 08/12/14  4:45 PM  Result Value Ref Range   Squamous Epithelial / LPF FEW (A) RARE   WBC, UA 7-10 <3 WBC/hpf   RBC / HPF 3-6 <3 RBC/hpf   Bacteria, UA MANY (A) RARE   Urine-Other MUCOUS PRESENT     MAU Course  Procedures  IV  hydration Phenergan  Clemmons,Lori Grissett 08/12/2014, 7:18 PM   MDM Patient reports improvement in nausea and no vomiting in MAU  Assessment and Plan  Assessment 1. Hyperemesis  Plan Discharge home Patient advised to continue previously prescribed anti-emetics Patient encouraged to follow-up with [facility] as scheduled for routine prenatal care Patient may return to MAU as needed or if her condition were to change or worsen   Marny Lowenstein, PA-C 08/13/2014 12:30 AM

## 2014-08-12 NOTE — Discharge Instructions (Signed)
Eating Plan for Hyperemesis Gravidarum °Severe cases of hyperemesis gravidarum can lead to dehydration and malnutrition. The hyperemesis eating plan is one way to lessen the symptoms of nausea and vomiting. It is often used with prescribed medicines to control your symptoms.  °WHAT CAN I DO TO RELIEVE MY SYMPTOMS? °Listen to your body. Everyone is different and has different preferences. Find what works best for you. Some of the following things may help: °· Eat and drink slowly. °· Eat 5-6 small meals daily instead of 3 large meals.   °· Eat crackers before you get out of bed in the morning.   °· Starchy foods are usually well tolerated (such as cereal, toast, bread, potatoes, pasta, rice, and pretzels).   °· Ginger may help with nausea. Add ¼ tsp ground ginger to hot tea or choose ginger tea.   °· Try drinking 100% fruit juice or an electrolyte drink. °· Continue to take your prenatal vitamins as directed by your health care provider. If you are having trouble taking your prenatal vitamins, talk with your health care provider about different options. °· Include at least 1 serving of protein with your meals and snacks (such as meats or poultry, beans, nuts, eggs, or yogurt). Try eating a protein-rich snack before bed (such as cheese and crackers or a half turkey or peanut butter sandwich). °WHAT THINGS SHOULD I AVOID TO REDUCE MY SYMPTOMS? °The following things may help reduce your symptoms: °· Avoid foods with strong smells. Try eating meals in well-ventilated areas that are free of odors. °· Avoid drinking water or other beverages with meals. Try not to drink anything less than 30 minutes before and after meals. °· Avoid drinking more than 1 cup of fluid at a time. °· Avoid fried or high-fat foods, such as butter and cream sauces. °· Avoid spicy foods. °· Avoid skipping meals the best you can. Nausea can be more intense on an empty stomach. If you cannot tolerate food at that time, do not force it. Try sucking on  ice chips or other frozen items and make up the calories later. °· Avoid lying down within 2 hours after eating. °Document Released: 04/02/2007 Document Revised: 06/10/2013 Document Reviewed: 04/09/2013 °ExitCare® Patient Information ©2015 [employer]. This information is not intended to replace advice given to you by your health care provider. Make sure you discuss any questions you have with your health care provider. ° °Morning Sickness °Morning sickness is when you feel sick to your stomach (nauseous) during pregnancy. You may feel sick to your stomach and throw up (vomit). You may feel sick in the morning, but you can feel this way any time of day. Some women feel very sick to their stomach and cannot stop throwing up (hyperemesis gravidarum). °HOME CARE °· Only take medicines as told by your doctor. °· Take multivitamins as told by your doctor. Taking multivitamins before getting pregnant can stop or lessen the harshness of morning sickness. °· Eat dry toast or unsalted crackers before getting out of bed. °· Eat 5 to 6 small meals a day. °· Eat dry and bland foods like rice and baked potatoes. °· Do not drink liquids with meals. Drink between meals. °· Do not eat greasy, fatty, or spicy foods. °· Have someone cook for you if the smell of food causes you to feel sick or throw up. °· If you feel sick to your stomach after taking prenatal vitamins, take them at night or with a snack. °· Eat protein when you need a snack (nuts,   yogurt, cheese). °· Eat unsweetened gelatins for dessert. °· Wear a bracelet used for sea sickness (acupressure wristband). °· Go to a doctor that puts thin needles into certain body points (acupuncture) to improve how you feel. °· Do not smoke. °· Use a humidifier to keep the air in your house free of odors. °· Get lots of fresh air. °GET HELP IF: °· You need medicine to feel better. °· You feel dizzy or lightheaded. °· You are losing weight. °GET HELP RIGHT AWAY IF:  °· You feel very  sick to your stomach and cannot stop throwing up. °· You pass out (faint). °MAKE SURE YOU: °· Understand these instructions. °· Will watch your condition. °· Will get help right away if you are not doing well or get worse. °Document Released: 07/13/2004 Document Revised: 06/10/2013 Document Reviewed: 11/20/2012 °ExitCare® Patient Information ©2015 [employer], LLC. This information is not intended to replace advice given to you by your health care provider. Make sure you discuss any questions you have with your health care provider. ° °

## 2014-08-12 NOTE — MAU Note (Signed)
Pt sent from MD office for IV fluids, hyperemesis.  Unable to keep anything down for the last 2 days, taking zofran & phenergan.  Denies pain, no diarrhea or bleeding.

## 2014-08-13 LAB — CYTOLOGY - PAP

## 2014-08-15 ENCOUNTER — Other Ambulatory Visit: Payer: Self-pay | Admitting: Nurse Practitioner

## 2014-08-15 MED ORDER — ONDANSETRON 4 MG PO TBDP: 4.0000 mg | ORAL_TABLET | Freq: Three times a day (TID) | ORAL | Status: DC | PRN

## 2014-08-15 NOTE — Progress Notes (Signed)
Client called and asked for a refill of Zofran.  Is out and needs a refill.  Is [redacted] weeks pregnant.  Reviewed her chart.  The risks and benefits of Zofran with the client was discussed with her previously and is clearly documented.  Reviewed again with her today and she requests the Zofran.  Reordered the prescription for her.

## 2014-08-17 ENCOUNTER — Inpatient Hospital Stay (HOSPITAL_COMMUNITY)
Admission: AD | Admit: 2014-08-17 | Discharge: 2014-08-17 | Disposition: A | Discharge status: Home / Self Care | Payer: 59 | Source: Ambulatory Visit | Attending: Obstetrics and Gynecology | Admitting: Obstetrics and Gynecology

## 2014-08-17 ENCOUNTER — Encounter (HOSPITAL_COMMUNITY): Payer: Self-pay | Admitting: General Practice

## 2014-08-17 DIAGNOSIS — K117 Disturbances of salivary secretion: Secondary | ICD-10-CM | POA: Diagnosis not present

## 2014-08-17 DIAGNOSIS — O99611 Diseases of the digestive system complicating pregnancy, first trimester: Secondary | ICD-10-CM | POA: Diagnosis not present

## 2014-08-17 DIAGNOSIS — Z3A10 10 weeks gestation of pregnancy: Secondary | ICD-10-CM | POA: Diagnosis not present

## 2014-08-17 DIAGNOSIS — O210 Mild hyperemesis gravidarum: Secondary | ICD-10-CM | POA: Diagnosis not present

## 2014-08-17 DIAGNOSIS — O219 Vomiting of pregnancy, unspecified: Secondary | ICD-10-CM

## 2014-08-17 LAB — URINALYSIS, ROUTINE W REFLEX MICROSCOPIC
Bilirubin Urine: NEGATIVE
Glucose, UA: NEGATIVE mg/dL
Hgb urine dipstick: NEGATIVE
KETONES UR: NEGATIVE mg/dL
LEUKOCYTES UA: NEGATIVE
NITRITE: NEGATIVE
PROTEIN: NEGATIVE mg/dL
Specific Gravity, Urine: 1.02 (ref 1.005–1.030)
Urobilinogen, UA: 0.2 mg/dL (ref 0.0–1.0)
pH: 7 (ref 5.0–8.0)

## 2014-08-17 MED ORDER — DEXTROSE 5 % IN LACTATED RINGERS IV BOLUS
1000.0000 mL | Freq: Once | INTRAVENOUS | Status: AC
  Administered 2014-08-17: 1000 mL via INTRAVENOUS

## 2014-08-17 MED ORDER — GLYCOPYRROLATE 1 MG PO TABS: 1.0000 mg | ORAL_TABLET | Freq: Three times a day (TID) | ORAL | Status: DC

## 2014-08-17 MED ORDER — GLYCOPYRROLATE 1 MG PO TABS
1.0000 mg | ORAL_TABLET | Freq: Once | ORAL | Status: AC
  Administered 2014-08-17: 1 mg via ORAL
  Filled 2014-08-17: qty 1

## 2014-08-17 MED ORDER — PROMETHAZINE HCL 25 MG/ML IJ SOLN
25.0000 mg | Freq: Once | INTRAVENOUS | Status: DC
  Filled 2014-08-17: qty 1

## 2014-08-17 NOTE — MAU Provider Note (Signed)
History     CSN: [id number]  Arrival date and time: 08/17/14 1120   First Provider Initiated Contact with Patient 08/17/14 1210      Chief Complaint  Patient presents with  . Emesis During Pregnancy   HPI  Heather [name] is a 29 y.o. G1P0 at 7767w5d who presents today with nausea and vomiting. She states that this has been an ongoing problem, and she has multiple medications at home. She states that those medications (phenergan and zofran) seem to help, but that she has more trouble with excess saliva.   Past Medical History  Diagnosis Date  . Chest pain   . Headache     Past Surgical History  Procedure Laterality Date  . Laparoscopic appendectomy N/A 01/13/2014    Procedure: APPENDECTOMY LAPAROSCOPIC;  Surgeon: Cherylynn RidgesJames O Wyatt, MD;  [facility];  Service: General;  Laterality: N/A;  . Tooth extraction      Family History  Problem Relation Age of Onset  . Hypertension Mother   . Diabetes Mother     History  Substance Use Topics  . Smoking status: Never Smoker   . Smokeless tobacco: Not on file  . Alcohol Use: No    Allergies:  Allergies  Allergen Reactions  . Penicillins Other (See Comments)    Unknown reaction; precaution; entire family allergic    Prescriptions prior to admission  Medication Sig Dispense Refill Last Dose  . ondansetron (ZOFRAN ODT) 4 MG disintegrating tablet Take 1 tablet (4 mg total) by mouth every 8 (eight) hours as needed for nausea or vomiting. 20 tablet 0   . Prenatal Vit-Fe Fumarate-FA (PRENATAL MULTIVITAMIN) TABS tablet Take 1 tablet by mouth daily at 12 noon.   08/11/2014 at Unknown time  . promethazine (PHENERGAN) 25 MG suppository Place 1 suppository (25 mg total) rectally every 6 (six) hours as needed for nausea or vomiting. 12 each 0 08/11/2014 at Unknown time  . promethazine (PHENERGAN) 25 MG tablet Take 1 tablet (25 mg total) by mouth every 6 (six) hours as needed for nausea or vomiting. 30 tablet 0 08/12/2014 at Unknown time     ROS Physical Exam   Blood pressure 119/83, pulse 104, temperature 98.6 F (37 C), resp. rate 18, weight 66.225 kg (146 lb), last menstrual period 12/15/2013.  Physical Exam  Nursing note and vitals reviewed. Constitutional: She is oriented to person, place, and time. She appears well-developed and well-nourished. No distress.  Cardiovascular: Normal rate.   Respiratory: Effort normal.  GI: Soft. There is no tenderness. There is no rebound.  Neurological: She is alert and oriented to person, place, and time.  Skin: Skin is warm and dry.  Psychiatric: She has a normal mood and affect.    MAU Course  Procedures  Results for orders placed or performed during the hospital encounter of 08/17/14 (from the past 24 hour(s))  Urinalysis, Routine w reflex microscopic     Status: Abnormal   Collection Time: 08/17/14 11:46 AM  Result Value Ref Range   Color, Urine AMBER (A) YELLOW   APPearance CLOUDY (A) CLEAR   Specific Gravity, Urine 1.020 1.005 - 1.030   pH 7.0 5.0 - 8.0   Glucose, UA NEGATIVE NEGATIVE mg/dL   Hgb urine dipstick NEGATIVE NEGATIVE   Bilirubin Urine NEGATIVE NEGATIVE   Ketones, ur NEGATIVE NEGATIVE mg/dL   Protein, ur NEGATIVE NEGATIVE mg/dL   Urobilinogen, UA 0.2 0.0 - 1.0 mg/dL   Nitrite NEGATIVE NEGATIVE   Leukocytes, UA NEGATIVE NEGATIVE   1342:  Patient states that her headache has improved. She is also feeling better, and has been able to keep fluids down. She also states that the robinul has helped decrease her saliva.   1350: D/W Dr. Henderson Cloud, ok for dc home.   Assessment and Plan   1. Nausea/vomiting in pregnancy   2. Ptyalism    DC home Comfort measures reviewed RX robinol  Return to MAU as needed  Follow-up Information    Follow up with [facility] Cristie Hem, MD.   Specialty:  Obstetrics and Gynecology   Why:  As scheduled   Contact information:   [address] [place] [address] [phone]        Tawnya Crook 08/17/2014, 12:13 PM

## 2014-08-17 NOTE — MAU Note (Addendum)
Called office. Has hyperemesis, keeps getting sent over. Also has ptyalism. Headache for 2 days.

## 2014-08-17 NOTE — Discharge Instructions (Signed)
Continue phenergan and zofran as needed Stop prenatal vitamin or switch to a chewable or gummy vitamin Lemonhead candies Add flavoring to water to help with taste.   Hyperemesis Gravidarum Hyperemesis gravidarum is a severe form of nausea and vomiting that happens during pregnancy. Hyperemesis is worse than morning sickness. It may cause you to have nausea or vomiting all day for many days. It may keep you from eating and drinking enough food and liquids. Hyperemesis usually occurs during the first half (the first 20 weeks) of pregnancy. It often goes away once a woman is in her second half of pregnancy. However, sometimes hyperemesis continues through an entire pregnancy.  CAUSES  The cause of this condition is not completely known but is thought to be related to changes in the body's hormones when pregnant. It could be from the high level of the pregnancy hormone or an increase in estrogen in the body.  SIGNS AND SYMPTOMS   Severe nausea and vomiting.  Nausea that does not go away.  Vomiting that does not allow you to keep any food down.  Weight loss and body fluid loss (dehydration).  Having no desire to eat or not liking food you have previously enjoyed. DIAGNOSIS  Your health care provider will do a physical exam and ask you about your symptoms. He or she may also order blood tests and urine tests to make sure something else is not causing the problem.  TREATMENT  You may only need medicine to control the problem. If medicines do not control the nausea and vomiting, you will be treated in the hospital to prevent dehydration, increased acid in the blood (acidosis), weight loss, and changes in the electrolytes in your body that may harm the unborn baby (fetus). You may need IV fluids.  HOME CARE INSTRUCTIONS   Only take over-the-counter or prescription medicines as directed by your health care provider.  Try eating a couple of dry crackers or toast in the morning before getting out of  bed.  Avoid foods and smells that upset your stomach.  Avoid fatty and spicy foods.  Eat 5-6 small meals a day.  Do not drink when eating meals. Drink between meals.  For snacks, eat high-protein foods, such as cheese.  Eat or suck on things that have ginger in them. Ginger helps nausea.  Avoid food preparation. The smell of food can spoil your appetite.  Avoid iron pills and iron in your multivitamins until after 3-4 months of being pregnant. However, consult with your health care provider before stopping any prescribed iron pills. SEEK MEDICAL CARE IF:   Your abdominal pain increases.  You have a severe headache.  You have vision problems.  You are losing weight. SEEK IMMEDIATE MEDICAL CARE IF:   You are unable to keep fluids down.  You vomit blood.  You have constant nausea and vomiting.  You have excessive weakness.  You have extreme thirst.  You have dizziness or fainting.  You have a fever or persistent symptoms for more than 2-3 days.  You have a fever and your symptoms suddenly get worse. MAKE SURE YOU:   Understand these instructions.  Will watch your condition.  Will get help right away if you are not doing well or get worse. Document Released: 06/05/2005 Document Revised: 03/26/2013 Document Reviewed: 01/15/2013 [facility] Patient Information 2015 [place], [place]. This information is not intended to replace advice given to you by your health care provider. Make sure you discuss any questions you have with your health  care provider.  

## 2014-08-19 ENCOUNTER — Inpatient Hospital Stay (HOSPITAL_COMMUNITY)
Admission: AD | Admit: 2014-08-19 | Discharge: 2014-08-21 | DRG: 781 | Disposition: A | Discharge status: Home / Self Care | Payer: 59 | Source: Ambulatory Visit | Attending: Obstetrics and Gynecology | Admitting: Obstetrics and Gynecology

## 2014-08-19 ENCOUNTER — Encounter (HOSPITAL_COMMUNITY): Payer: Self-pay | Admitting: *Deleted

## 2014-08-19 DIAGNOSIS — O210 Mild hyperemesis gravidarum: Principal | ICD-10-CM | POA: Diagnosis present

## 2014-08-19 DIAGNOSIS — Z3A11 11 weeks gestation of pregnancy: Secondary | ICD-10-CM | POA: Diagnosis present

## 2014-08-19 LAB — CBC
HEMATOCRIT: 35.7 % — AB (ref 36.0–46.0)
HEMOGLOBIN: 13.2 g/dL (ref 12.0–15.0)
MCH: 30.3 pg (ref 26.0–34.0)
MCHC: 37 g/dL — AB (ref 30.0–36.0)
MCV: 82.1 fL (ref 78.0–100.0)
Platelets: 190 10*3/uL (ref 150–400)
RBC: 4.35 MIL/uL (ref 3.87–5.11)
RDW: 12.1 % (ref 11.5–15.5)
WBC: 7.2 10*3/uL (ref 4.0–10.5)

## 2014-08-19 LAB — TSH: TSH: 2.015 u[IU]/mL (ref 0.350–4.500)

## 2014-08-19 LAB — T4, FREE: FREE T4: 1.04 ng/dL (ref 0.80–1.80)

## 2014-08-19 MED ORDER — ACETAMINOPHEN 325 MG PO TABS: 650.0000 mg | ORAL_TABLET | ORAL | Status: DC | PRN

## 2014-08-19 MED ORDER — DOCUSATE SODIUM 100 MG PO CAPS
100.0000 mg | ORAL_CAPSULE | Freq: Every day | ORAL | Status: DC
Start: 2014-08-19 — End: 2014-08-21
  Administered 2014-08-19 – 2014-08-21 (×3): 100 mg via ORAL
  Filled 2014-08-19 (×3): qty 1

## 2014-08-19 MED ORDER — DEXTROSE IN LACTATED RINGERS 5 % IV SOLN
INTRAVENOUS | Status: DC
  Administered 2014-08-19 – 2014-08-21 (×6): via INTRAVENOUS

## 2014-08-19 MED ORDER — PROMETHAZINE HCL 25 MG/ML IJ SOLN
25.0000 mg | Freq: Four times a day (QID) | INTRAMUSCULAR | Status: DC | PRN
  Administered 2014-08-21: 25 mg via INTRAVENOUS
  Filled 2014-08-19 (×2): qty 1

## 2014-08-19 MED ORDER — PYRIDOXINE HCL 100 MG/ML IJ SOLN
100.0000 mg | Freq: Every day | INTRAMUSCULAR | Status: DC
  Administered 2014-08-19 – 2014-08-20 (×2): 100 mg via INTRAVENOUS
  Filled 2014-08-19 (×4): qty 1

## 2014-08-19 MED ORDER — DIPHENHYDRAMINE HCL 50 MG/ML IJ SOLN
12.5000 mg | Freq: Four times a day (QID) | INTRAMUSCULAR | Status: DC
  Administered 2014-08-19 – 2014-08-21 (×8): 12.5 mg via INTRAVENOUS
  Filled 2014-08-19 (×9): qty 1

## 2014-08-19 MED ORDER — ZOLPIDEM TARTRATE 5 MG PO TABS: 5.0000 mg | ORAL_TABLET | Freq: Every evening | ORAL | Status: DC | PRN

## 2014-08-19 MED ORDER — PANTOPRAZOLE SODIUM 20 MG PO TBEC
20.0000 mg | DELAYED_RELEASE_TABLET | Freq: Every day | ORAL | Status: DC
Start: 2014-08-19 — End: 2014-08-21
  Administered 2014-08-19 – 2014-08-20 (×2): 20 mg via ORAL
  Filled 2014-08-19 (×4): qty 1

## 2014-08-19 MED ORDER — PRENATAL MULTIVITAMIN CH
1.0000 | ORAL_TABLET | Freq: Every day | ORAL | Status: DC
  Administered 2014-08-19 – 2014-08-21 (×3): 1 via ORAL
  Filled 2014-08-19 (×3): qty 1

## 2014-08-19 MED ORDER — CALCIUM CARBONATE ANTACID 500 MG PO CHEW: 2.0000 | CHEWABLE_TABLET | ORAL | Status: DC | PRN

## 2014-08-19 MED ORDER — METOCLOPRAMIDE HCL 5 MG/ML IJ SOLN
10.0000 mg | Freq: Three times a day (TID) | INTRAMUSCULAR | Status: DC
  Administered 2014-08-19 – 2014-08-21 (×6): 10 mg via INTRAVENOUS
  Filled 2014-08-19 (×6): qty 2

## 2014-08-19 NOTE — H&P (Addendum)
Heather [name] is a 29 y.o. female G1P0 @ 11 wks presenting for hyperemesis.  Pt also reports that excessive saliva contributes to nausea.  Pt with 17# wt loss during pregnancy.  Unable to tolerate PO since Monday.  IVF help with symptoms temporarily.   History OB History    Gravida Para Term Preterm AB TAB SAB Ectopic Multiple Living   1              Past Medical History  Diagnosis Date  . Chest pain   . Headache    Past Surgical History  Procedure Laterality Date  . Laparoscopic appendectomy N/A 01/13/2014    Procedure: APPENDECTOMY LAPAROSCOPIC;  Surgeon: Heather RidgesJames O Wyatt, MD;  [facility];  Service: General;  Laterality: N/A;  . Tooth extraction     Family History: family history includes Diabetes in her mother; Hypertension in her mother. Social History:  reports that she has never smoked. She does not have any smokeless tobacco history on file. She reports that she does not drink alcohol or use illicit drugs.   Prenatal Transfer Tool  Maternal Diabetes: No Genetic Screening: Declined Maternal Ultrasounds/Referrals: Normal Fetal Ultrasounds or other Referrals:  None Maternal Substance Abuse:  No Significant Maternal Medications:  None Significant Maternal Lab Results:  None Other Comments:  None  ROS    Blood pressure 108/67, pulse 81, temperature 99.3 F (37.4 C), temperature source Oral, resp. rate 18, height 5\' 9"  (1.753 m), weight 143 lb (64.864 kg), last menstrual period 12/15/2013, SpO2 100 %. Exam Physical Exam  Gen - ill appearing  Abd - soft, NT + FHT Ext - NT Prenatal labs: ABO, Rh:   Antibody:   Rubella:   RPR: NON REAC (07/27 1750)  HBsAg:    HIV: NONREACTIVE (07/27 1750)  GBS:     Assessment/Plan: Hyperemesis IVF IV antiemetics, B6, protonix Check CBC/TFTs Robinul for ptyalism Consider steroid tx if no relief with iv meds/fluids   Heather [name] 08/19/2014, 9:22 PM

## 2014-08-20 DIAGNOSIS — O210 Mild hyperemesis gravidarum: Secondary | ICD-10-CM | POA: Diagnosis present

## 2014-08-20 DIAGNOSIS — Z3A11 11 weeks gestation of pregnancy: Secondary | ICD-10-CM | POA: Diagnosis present

## 2014-08-20 DIAGNOSIS — R1110 Vomiting, unspecified: Secondary | ICD-10-CM | POA: Diagnosis present

## 2014-08-20 MED ORDER — BOOST / RESOURCE BREEZE PO LIQD
237.0000 mL | Freq: Three times a day (TID) | ORAL | Status: DC
  Administered 2014-08-20 (×2): 1 via ORAL
  Administered 2014-08-21: 09:00:00 via ORAL
  Filled 2014-08-20 (×5): qty 1

## 2014-08-20 NOTE — Progress Notes (Signed)
INITIAL NUTRITION ASSESSMENT  DOCUMENTATION CODES Per approved criteria  -Non-severe (moderate) malnutrition in the context of acute illness or injury   INTERVENTION: Regular diet as tolerated Encourage snacks between meals/ Resource Nutritional supplement Small freq meals to moderate nausea NUTRITION DIAGNOSIS: Inadequate oral intake  related to hyperemesis  as evidenced by weight loss, nausea.   Goal: tol of po diet, weight gain  Monitor:  Weight trend  Reason for Assessment: Hyperemesis Dx  29 y.o. female  Admitting Dx: <principal problem not specified>  ASSESSMENT: Reported 17 lb weight loss PTA. No po intake for 3 days PTA. Meets ASPEN criteria for mild to mod degree of malnutrition based on weight loss ( > 5% in 1 month) and po intake < 75 % of est needs for > 7 days  Height: Ht Readings from Last 1 Encounters:  08/19/14 5\' 9"  (1.753 m)    Weight: Wt Readings from Last 1 Encounters:  08/20/14 150 lb (68.04 kg)    Ideal Body Weight: 145 Lbs  % Ideal Body Weight: 103%  Wt Readings from Last 10 Encounters:  08/20/14 150 lb (68.04 kg)  08/17/14 146 lb (66.225 kg)  08/12/14 147 lb 6 oz (66.849 kg)  07/29/14 148 lb (67.132 kg)  02/10/14 160 lb (72.576 kg)  01/12/14 161 lb 8 oz (73.256 kg)    Usual Body Weight: 160 lbs  % Usual Body Weight: 93.7%  BMI:  Body mass index is 22.14 kg/(m^2).  Estimated Nutritional Needs: Kcal: 1700-1900 Protein: 75-85g Fluid: 2L  Diet Order: Diet regular  EDUCATION NEEDS: -No education needs identified at this time   Intake/Output Summary (Last 24 hours) at 08/20/14 0820 Last data filed at 08/20/14 0658  Gross per 24 hour  Intake   3225 ml  Output      0 ml  Net   3225 ml      Labs:  No results for input(s): NA, K, CL, CO2, BUN, CREATININE, CALCIUM, MG, PHOS, GLUCOSE in the last 168 hours.   Scheduled Meds: . diphenhydrAMINE  12.5 mg Intravenous Q6H  . docusate sodium  100 mg Oral Daily  .  metoCLOPramide (REGLAN) injection  10 mg Intravenous 3 times per day  . pantoprazole  20 mg Oral Daily  . prenatal multivitamin  1 tablet Oral Q1200  . pyridOXINE  100 mg Intravenous Daily    Continuous Infusions: . dextrose 5% lactated ringers 125 mL/hr at 08/20/14 [id number]    Past Medical History  Diagnosis Date  . Chest pain   . Headache     Past Surgical History  Procedure Laterality Date  . Laparoscopic appendectomy N/A 01/13/2014    Procedure: APPENDECTOMY LAPAROSCOPIC;  Surgeon: Cherylynn RidgesJames O Wyatt, MD;  [facility];  Service: General;  Laterality: N/A;  . Tooth extraction      Elisabeth CaraKatherine Arlesia Kiel M.Odis LusterEd. R.D. LDN Neonatal Nutrition Support Specialist/RD III Pager [phone]

## 2014-08-20 NOTE — Progress Notes (Signed)
S: Nausea improved, able to tolerate food and liquids this am. Ptyalism continues O: VSS, afebrile Abd soft Results for orders placed or performed during the hospital encounter of 08/19/14 (from the past 24 hour(s))  CBC on admission     Status: Abnormal   Collection Time: 08/19/14  1:35 PM  Result Value Ref Range   WBC 7.2 4.0 - 10.5 K/uL   RBC 4.35 3.87 - 5.11 MIL/uL   Hemoglobin 13.2 12.0 - 15.0 g/dL   HCT 16.135.7 (L) 09.636.0 - 04.546.0 %   MCV 82.1 78.0 - 100.0 fL   MCH 30.3 26.0 - 34.0 pg   MCHC 37.0 (H) 30.0 - 36.0 g/dL   RDW 40.912.1 81.111.5 - 91.415.5 %   Platelets 190 150 - 400 K/uL  TSH     Status: None   Collection Time: 08/19/14  1:35 PM  Result Value Ref Range   TSH 2.015 0.350 - 4.500 uIU/mL  T4, free     Status: None   Collection Time: 08/19/14  1:35 PM  Result Value Ref Range   Free T4 1.04 0.80 - 1.80 ng/dL  A: IUP 11 weeks  P: Observe today  Lemon wedges ordered

## 2014-08-21 MED ORDER — METOCLOPRAMIDE HCL 10 MG PO TABS: 10.0000 mg | ORAL_TABLET | Freq: Three times a day (TID) | ORAL | Status: DC

## 2014-08-21 MED ORDER — ONDANSETRON 8 MG PO TBDP: 8.0000 mg | ORAL_TABLET | Freq: Three times a day (TID) | ORAL | Status: DC | PRN

## 2014-08-21 MED ORDER — PROMETHAZINE HCL 25 MG RE SUPP: 25.0000 mg | Freq: Four times a day (QID) | RECTAL | Status: DC | PRN

## 2014-08-21 MED ORDER — PROMETHAZINE HCL 25 MG PO TABS: 25.0000 mg | ORAL_TABLET | Freq: Four times a day (QID) | ORAL | Status: DC | PRN

## 2014-08-21 MED ORDER — GLYCOPYRROLATE 1 MG PO TABS: 1.0000 mg | ORAL_TABLET | Freq: Three times a day (TID) | ORAL | Status: DC

## 2014-08-21 NOTE — Progress Notes (Signed)
Pt is discharged in the care of Mother,with N.T. Escort. Denies any pain or discomfort. Spirits are good. Stated nausea was improving. States she understands all discharged instructions well Questions were asked and answered. Pt is continuing to spitt,but states it is also better.No equipment needed for home use.

## 2014-08-21 NOTE — Progress Notes (Signed)
S: Reports yesterday with improvement, able to tolerate diet and fluids. Awaken this am with dry heaves, tried cereal with small vomitus.Stopped lemon wedges secondary to burning orally. Has received Phenergan IV and currently feeling better. Is hopeful for discharge later today O: VSS , Afebrile Wt: 152#  ABd soft. Denies any vag bleeding or discharge or cramping + BM yesterday  A: 11 1/7 week IUP , hyperemesis and ptyalism P: Continue observation, if able to tolerate lunch, consider discharge this pm

## 2014-08-24 ENCOUNTER — Inpatient Hospital Stay (HOSPITAL_COMMUNITY): Admission: AD | Admit: 2014-08-24 | Payer: 59 | Source: Ambulatory Visit | Admitting: Obstetrics and Gynecology

## 2014-08-25 NOTE — Discharge Summary (Signed)
Obstetric Discharge Summary Reason for Admission: observation/evaluation Prenatal Procedures: ultrasound Intrapartum Procedures: na Postpartum Procedures: na Complications-Operative and Postpartum: na HEMOGLOBIN  Date Value Ref Range Status  08/19/2014 13.2 12.0 - 15.0 g/dL Final   HCT  Date Value Ref Range Status  08/19/2014 35.7* 36.0 - 46.0 % Final    Physical Exam:  General: alert and cooperative Lochia: na Uterine Fundus: gravid Incision: na DVT Evaluation: No evidence of DVT seen on physical exam. Negative Homan's sign. No cords or calf tenderness. No significant calf/ankle edema.  Discharge Diagnoses: Hyperemesis graviderum  Discharge Information: Date: 08/25/2014 Activity: pelvic rest Diet: routine Medications: PNV and zofran Condition: improved Instructions: call for increased nausea or vomiting, leakage of amniotic fluid or vag bleeding Discharge to: home   Newborn Data: This patient has no babies on file. Home with na.  Timira Bieda G 08/25/2014, 8:21 AM

## 2014-08-26 ENCOUNTER — Encounter (HOSPITAL_COMMUNITY): Payer: Self-pay

## 2014-08-26 MED ORDER — GENTAMICIN SULFATE 40 MG/ML IJ SOLN
INTRAVENOUS | Status: AC
  Administered 2014-08-27: 114.25 mL via INTRAVENOUS
  Filled 2014-08-26: qty 8.25

## 2014-08-26 NOTE — H&P (Signed)
Heather [name] is an 29 y.o. female with MAB at 11 weeks CRL.   Pertinent Gynecological History: Menses: N/A Bleeding: N/A Contraception: none DES exposure: denies Blood transfusions: none Sexually transmitted diseases: no past history Previous GYN Procedures: none  Last mammogram: N/A Date: N/A Last pap: normal Date: 2016 OB History: G1, P0   Menstrual History: Menarche age: unknown  Patient's last menstrual period was 06/03/2014 (exact date).    Past Medical History  Diagnosis Date  . Chest pain   . Shortness of breath dyspnea     with chest pains  . Anginal pain     cardiac workup normal  . Hypothyroidism     borderline  . Anxiety   . Depression   . GERD (gastroesophageal reflux disease)     with pregnancy  . Headache     migraines    Past Surgical History  Procedure Laterality Date  . Laparoscopic appendectomy N/A 01/13/2014    Procedure: APPENDECTOMY LAPAROSCOPIC;  Surgeon: Heather RidgesJames O Wyatt, MD;  [facility];  Service: General;  Laterality: N/A;  . Tooth extraction    . Appendectomy      Family History  Problem Relation Age of Onset  . Hypertension Mother   . Diabetes Mother     Social History:  reports that she has never smoked. She has never used smokeless tobacco. She reports that she does not drink alcohol or use illicit drugs.  Allergies:  Allergies  Allergen Reactions  . Penicillins Other (See Comments)    Unknown reaction; precaution; entire family allergic    No prescriptions prior to admission    Review of Systems  Constitutional: Negative for fever.    Last menstrual period 06/03/2014. Physical Exam  Cardiovascular: Normal rate and regular rhythm.   Respiratory: Effort normal and breath sounds normal.  GI: Soft.    No results found for this or any previous visit (from the past 24 hour(s)).  No results found.  Assessment/Plan: 29 yo G1P0 with MAB at 11 weeks CRL  D/W patient options-D&E reviewed Possible risks reviewed  including but not limited to infection, uterine perforation, organ damage, bleeding/transfusion-HIV/Hep, DVT/PE, Pneumonia, IU synechia and secondary infertility Will send tissue for karyotype All questions answered Patient states she understands and agrees  Heather [name],Heather [name] E 08/26/2014, 9:45 AM

## 2014-08-27 ENCOUNTER — Encounter (HOSPITAL_COMMUNITY)
Admission: RE | Disposition: A | Discharge status: Home / Self Care | Payer: Self-pay | Source: Ambulatory Visit | Attending: Obstetrics and Gynecology

## 2014-08-27 ENCOUNTER — Ambulatory Visit (HOSPITAL_COMMUNITY): Payer: 59 | Admitting: Certified Registered Nurse Anesthetist

## 2014-08-27 ENCOUNTER — Encounter (HOSPITAL_COMMUNITY): Payer: Self-pay | Admitting: Certified Registered Nurse Anesthetist

## 2014-08-27 ENCOUNTER — Ambulatory Visit (HOSPITAL_COMMUNITY): Payer: 59

## 2014-08-27 ENCOUNTER — Ambulatory Visit (HOSPITAL_COMMUNITY)
Admission: RE | Admit: 2014-08-27 | Discharge: 2014-08-27 | Disposition: A | Discharge status: Home / Self Care | Payer: 59 | Source: Ambulatory Visit | Attending: Obstetrics and Gynecology | Admitting: Obstetrics and Gynecology

## 2014-08-27 DIAGNOSIS — K219 Gastro-esophageal reflux disease without esophagitis: Secondary | ICD-10-CM | POA: Diagnosis not present

## 2014-08-27 DIAGNOSIS — Z880 Allergy status to penicillin: Secondary | ICD-10-CM | POA: Insufficient documentation

## 2014-08-27 DIAGNOSIS — Z9049 Acquired absence of other specified parts of digestive tract: Secondary | ICD-10-CM | POA: Diagnosis not present

## 2014-08-27 DIAGNOSIS — G43909 Migraine, unspecified, not intractable, without status migrainosus: Secondary | ICD-10-CM | POA: Diagnosis not present

## 2014-08-27 DIAGNOSIS — F419 Anxiety disorder, unspecified: Secondary | ICD-10-CM | POA: Diagnosis not present

## 2014-08-27 DIAGNOSIS — Z3A11 11 weeks gestation of pregnancy: Secondary | ICD-10-CM

## 2014-08-27 DIAGNOSIS — F329 Major depressive disorder, single episode, unspecified: Secondary | ICD-10-CM | POA: Insufficient documentation

## 2014-08-27 DIAGNOSIS — E039 Hypothyroidism, unspecified: Secondary | ICD-10-CM | POA: Insufficient documentation

## 2014-08-27 DIAGNOSIS — O021 Missed abortion: Secondary | ICD-10-CM | POA: Insufficient documentation

## 2014-08-27 HISTORY — DX: Angina pectoris, unspecified: I20.9

## 2014-08-27 HISTORY — DX: Hypothyroidism, unspecified: E03.9

## 2014-08-27 HISTORY — DX: Depression, unspecified: F32.A

## 2014-08-27 HISTORY — PX: DILATION AND EVACUATION: SHX1459

## 2014-08-27 HISTORY — DX: Gastro-esophageal reflux disease without esophagitis: K21.9

## 2014-08-27 HISTORY — DX: Anxiety disorder, unspecified: F41.9

## 2014-08-27 HISTORY — DX: Major depressive disorder, single episode, unspecified: F32.9

## 2014-08-27 SURGERY — DILATION AND EVACUATION, UTERUS
Anesthesia: Monitor Anesthesia Care

## 2014-08-27 MED ORDER — FENTANYL CITRATE 0.05 MG/ML IJ SOLN
25.0000 ug | INTRAMUSCULAR | Status: DC | PRN
  Administered 2014-08-27: 50 ug via INTRAVENOUS

## 2014-08-27 MED ORDER — PROPOFOL INFUSION 10 MG/ML OPTIME
INTRAVENOUS | Status: DC | PRN
  Administered 2014-08-27: 75 ug/kg/min via INTRAVENOUS

## 2014-08-27 MED ORDER — FENTANYL CITRATE 0.05 MG/ML IJ SOLN
INTRAMUSCULAR | Status: DC | PRN
  Administered 2014-08-27 (×2): 50 ug via INTRAVENOUS

## 2014-08-27 MED ORDER — METHYLERGONOVINE MALEATE 0.2 MG/ML IJ SOLN
INTRAMUSCULAR | Status: AC
  Filled 2014-08-27: qty 1

## 2014-08-27 MED ORDER — ONDANSETRON HCL 4 MG/2ML IJ SOLN
INTRAMUSCULAR | Status: DC | PRN
  Administered 2014-08-27: 4 mg via INTRAVENOUS

## 2014-08-27 MED ORDER — ONDANSETRON HCL 4 MG/2ML IJ SOLN
INTRAMUSCULAR | Status: AC
  Filled 2014-08-27: qty 2

## 2014-08-27 MED ORDER — LIDOCAINE HCL 1 % IJ SOLN
INTRAMUSCULAR | Status: AC
  Filled 2014-08-27: qty 20

## 2014-08-27 MED ORDER — KETOROLAC TROMETHAMINE 30 MG/ML IJ SOLN
INTRAMUSCULAR | Status: DC | PRN
  Administered 2014-08-27: 30 mg via INTRAVENOUS

## 2014-08-27 MED ORDER — PROPOFOL 10 MG/ML IV BOLUS
INTRAVENOUS | Status: AC
  Filled 2014-08-27: qty 40

## 2014-08-27 MED ORDER — MIDAZOLAM HCL 2 MG/2ML IJ SOLN
INTRAMUSCULAR | Status: AC
  Filled 2014-08-27: qty 2

## 2014-08-27 MED ORDER — SCOPOLAMINE 1 MG/3DAYS TD PT72
1.0000 | MEDICATED_PATCH | Freq: Once | TRANSDERMAL | Status: DC
  Administered 2014-08-27: 1.5 mg via TRANSDERMAL

## 2014-08-27 MED ORDER — LIDOCAINE HCL (CARDIAC) 20 MG/ML IV SOLN
INTRAVENOUS | Status: DC | PRN
  Administered 2014-08-27: 80 mg via INTRAVENOUS

## 2014-08-27 MED ORDER — DEXAMETHASONE SODIUM PHOSPHATE 10 MG/ML IJ SOLN
INTRAMUSCULAR | Status: DC | PRN
  Administered 2014-08-27: 4 mg via INTRAVENOUS

## 2014-08-27 MED ORDER — MIDAZOLAM HCL 2 MG/2ML IJ SOLN
INTRAMUSCULAR | Status: DC | PRN
  Administered 2014-08-27: 2 mg via INTRAVENOUS

## 2014-08-27 MED ORDER — FENTANYL CITRATE 0.05 MG/ML IJ SOLN
INTRAMUSCULAR | Status: AC
  Filled 2014-08-27: qty 2

## 2014-08-27 MED ORDER — LIDOCAINE HCL 1 % IJ SOLN
INTRAMUSCULAR | Status: DC | PRN
  Administered 2014-08-27: 20 mL

## 2014-08-27 MED ORDER — METOCLOPRAMIDE HCL 5 MG/ML IJ SOLN: 10.0000 mg | Freq: Once | INTRAMUSCULAR | Status: DC | PRN

## 2014-08-27 MED ORDER — METHYLERGONOVINE MALEATE 0.2 MG PO TABS: 0.2000 mg | ORAL_TABLET | Freq: Three times a day (TID) | ORAL | Status: DC

## 2014-08-27 MED ORDER — LIDOCAINE HCL (CARDIAC) 20 MG/ML IV SOLN
INTRAVENOUS | Status: AC
  Filled 2014-08-27: qty 5

## 2014-08-27 MED ORDER — LACTATED RINGERS IV SOLN
INTRAVENOUS | Status: DC
  Administered 2014-08-27 (×2): via INTRAVENOUS

## 2014-08-27 MED ORDER — PROPOFOL 10 MG/ML IV EMUL
INTRAVENOUS | Status: DC | PRN
  Administered 2014-08-27: 40 mg via INTRAVENOUS

## 2014-08-27 MED ORDER — OXYTOCIN 10 UNIT/ML IJ SOLN
INTRAMUSCULAR | Status: AC
  Filled 2014-08-27: qty 4

## 2014-08-27 MED ORDER — OXYTOCIN 10 UNIT/ML IJ SOLN
40.0000 [IU] | INTRAVENOUS | Status: DC | PRN
  Administered 2014-08-27: 40 [IU] via INTRAVENOUS

## 2014-08-27 MED ORDER — OXYCODONE-ACETAMINOPHEN 5-325 MG PO TABS: 1.0000 | ORAL_TABLET | Freq: Four times a day (QID) | ORAL | Status: DC | PRN

## 2014-08-27 MED ORDER — MEPERIDINE HCL 25 MG/ML IJ SOLN: 6.2500 mg | INTRAMUSCULAR | Status: DC | PRN

## 2014-08-27 MED ORDER — SCOPOLAMINE 1 MG/3DAYS TD PT72
MEDICATED_PATCH | TRANSDERMAL | Status: AC
  Administered 2014-08-27: 1.5 mg via TRANSDERMAL
  Filled 2014-08-27: qty 1

## 2014-08-27 MED ORDER — METHYLERGONOVINE MALEATE 0.2 MG/ML IJ SOLN
INTRAMUSCULAR | Status: DC | PRN
  Administered 2014-08-27: 0.2 mg via INTRAMUSCULAR

## 2014-08-27 MED ORDER — KETOROLAC TROMETHAMINE 30 MG/ML IJ SOLN
INTRAMUSCULAR | Status: AC
  Filled 2014-08-27: qty 1

## 2014-08-27 SURGICAL SUPPLY — 18 items
CATH ROBINSON RED A/P 16FR (CATHETERS) ×3 IMPLANT
CLOTH BEACON ORANGE TIMEOUT ST (SAFETY) ×3 IMPLANT
DECANTER SPIKE VIAL GLASS SM (MISCELLANEOUS) ×3 IMPLANT
GLOVE BIO SURGEON STRL SZ7.5 (GLOVE) ×6 IMPLANT
GLOVE BIOGEL PI IND STRL 8 (GLOVE) ×1 IMPLANT
GLOVE BIOGEL PI INDICATOR 8 (GLOVE) ×2
GOWN STRL REUS W/TWL LRG LVL3 (GOWN DISPOSABLE) ×6 IMPLANT
KIT BERKELEY 1ST TRIMESTER 3/8 (MISCELLANEOUS) ×3 IMPLANT
NS IRRIG 1000ML POUR BTL (IV SOLUTION) ×3 IMPLANT
PACK VAGINAL MINOR WOMEN LF (CUSTOM PROCEDURE TRAY) ×3 IMPLANT
PAD OB MATERNITY 4.3X12.25 (PERSONAL CARE ITEMS) ×3 IMPLANT
PAD PREP 24X48 CUFFED NSTRL (MISCELLANEOUS) ×3 IMPLANT
SET BERKELEY SUCTION TUBING (SUCTIONS) ×3 IMPLANT
TOWEL OR 17X24 6PK STRL BLUE (TOWEL DISPOSABLE) ×3 IMPLANT
VACURETTE 10 RIGID CVD (CANNULA) ×3 IMPLANT
VACURETTE 7MM CVD STRL WRAP (CANNULA) IMPLANT
VACURETTE 8 RIGID CVD (CANNULA) IMPLANT
VACURETTE 9 RIGID CVD (CANNULA) IMPLANT

## 2014-08-27 NOTE — Brief Op Note (Signed)
08/27/2014  2:31 PM  PATIENT:  Annitta JerseySamantha Hidalgo  29 y.o. female  PRE-OPERATIVE DIAGNOSIS:  MISSED ABORTION 11WKS/CHROMOSOME STUDIES  POST-OPERATIVE DIAGNOSIS:  same  PROCEDURE:  Procedure(s): DILATATION AND EVACUATION WITH CHROMOSOMES STUDES, ULTRASOUND (N/A)  SURGEON:  Surgeon(s) and Role:    * Harold HedgeJames Abdul Beirne, MD - Primary  PHYSICIAN ASSISTANT:   ASSISTANTS: none   ANESTHESIA:   general  EBL:  Total I/O In: 800 [I.V.:800] Out: 250 [Urine:100; Blood:150]  BLOOD ADMINISTERED:none  DRAINS: none   LOCAL MEDICATIONS USED:  XYLOCAINE  and Amount: 20 ml  SPECIMEN:  Source of Specimen:  POC  DISPOSITION OF SPECIMEN:  PATHOLOGY  COUNTS:  YES  TOURNIQUET:  * No tourniquets in log *  DICTATION: .Other Dictation: Dictation Number [id number]  PLAN OF CARE: Discharge to home after PACU  PATIENT DISPOSITION:  PACU - hemodynamically stable.   Delay start of Pharmacological VTE agent (>24hrs) due to surgical blood loss or risk of bleeding: not applicable

## 2014-08-27 NOTE — Anesthesia Postprocedure Evaluation (Signed)
Anesthesia Post Note  Patient: Heather JerseySamantha Gaultney  Procedure(s) Performed: Procedure(s) (LRB): DILATATION AND EVACUATION WITH CHROMOSOMES STUDES, ULTRASOUND (N/A)  Anesthesia type: General  Patient location: PACU  Post pain: Pain level controlled  Post assessment: Post-op Vital signs reviewed  Last Vitals:  Filed Vitals:   08/27/14 1500  BP: 96/59  Pulse: 80  Temp:   Resp: 18    Post vital signs: Reviewed  Level of consciousness: sedated  Complications: No apparent anesthesia complications

## 2014-08-27 NOTE — Discharge Instructions (Signed)
No vaginal entryDISCHARGE INSTRUCTIONS: D&C / D&E °The following instructions have been prepared to help you care for yourself upon your return home. °  °Personal hygiene: °• Use sanitary pads for vaginal drainage, not tampons. °• Shower the day after your procedure. °• NO tub baths, pools or Jacuzzis for 2-3 weeks. °• Wipe front to back after using the bathroom. ° °Activity and limitations: °• Do NOT drive or operate any equipment for 24 hours. The effects of anesthesia are still present and drowsiness may result. °• Do NOT rest in bed all day. °• Walking is encouraged. °• Walk up and down stairs slowly. °• You may resume your normal activity in one to two days or as indicated by your physician. ° °Sexual activity: NO intercourse for at least 2 weeks after the procedure, or as indicated by your physician. ° °Diet: Eat a light meal as desired this evening. You may resume your usual diet tomorrow. ° °Return to work: You may resume your work activities in one to two days or as indicated by your doctor. ° °What to expect after your surgery: Expect to have vaginal bleeding/discharge for 2-3 days and spotting for up to 10 days. It is not unusual to have soreness for up to 1-2 weeks. You may have a slight burning sensation when you urinate for the first day. Mild cramps may continue for a couple of days. You may have a regular period in 2-6 weeks. ° °Call your doctor for any of the following: °• Excessive vaginal bleeding, saturating and changing one pad every hour. °• Inability to urinate 6 hours after discharge from hospital. °• Pain not relieved by pain medication. °• Fever of 100.4° F or greater. °• Unusual vaginal discharge or odor. ° ° Call for an appointment:  ° ° °Patient’s signature: ______________________ ° °Nurse’s signature ________________________ ° °Support person's signature_______________________ ° ° ° °

## 2014-08-27 NOTE — Progress Notes (Signed)
No changes to H&P per patient history Reviewed with patient and husband procedure- D&E with karyotype All questions answered. Patient states she understands and agrees

## 2014-08-27 NOTE — Anesthesia Preprocedure Evaluation (Signed)
Anesthesia Evaluation  Patient identified by MRN, date of birth, ID band Patient awake    Reviewed: Allergy & Precautions, NPO status , Patient's Chart, lab work & pertinent test results  Airway Mallampati: III  TM Distance: >3 FB Neck ROM: Full    Dental no notable dental hx. (+) Teeth Intact   Pulmonary shortness of breath,  breath sounds clear to auscultation  Pulmonary exam normal       Cardiovascular + angina Rhythm:Regular Rate:Normal     Neuro/Psych  Headaches, PSYCHIATRIC DISORDERS Anxiety Depression    GI/Hepatic GERD-  Medicated and Controlled,Ptyalism   Endo/Other  Hypothyroidism   Renal/GU   negative genitourinary   Musculoskeletal negative musculoskeletal ROS (+)   Abdominal   Peds  Hematology negative hematology ROS (+)   Anesthesia Other Findings   Reproductive/Obstetrics (+) Pregnancy 11 Weeks Missed Ab                             Anesthesia Physical Anesthesia Plan  ASA: II  Anesthesia Plan: MAC   Post-op Pain Management:    Induction: Intravenous  Airway Management Planned: Natural Airway  Additional Equipment:   Intra-op Plan:   Post-operative Plan:   Informed Consent: I have reviewed the patients History and Physical, chart, labs and discussed the procedure including the risks, benefits and alternatives for the proposed anesthesia with the patient or authorized representative who has indicated his/her understanding and acceptance.     Plan Discussed with: Anesthesiologist, CRNA and Surgeon  Anesthesia Plan Comments:         Anesthesia Quick Evaluation

## 2014-08-27 NOTE — Transfer of Care (Signed)
Immediate Anesthesia Transfer of Care Note  Patient: Heather JerseySamantha [name]  Procedure(s) Performed: Procedure(s): DILATATION AND EVACUATION WITH CHROMOSOMES STUDES, ULTRASOUND (N/A)  Patient Location: PACU  Anesthesia Type:MAC  Level of Consciousness: awake, alert  and oriented  Airway & Oxygen Therapy: Patient Spontanous Breathing and Patient connected to nasal cannula oxygen  Post-op Assessment: Report given to RN and Patient moving all extremities  Post vital signs: Reviewed and stable  Last Vitals:  Filed Vitals:   08/27/14 1229  BP: 110/81  Pulse: 106  Temp: 36.9 C  Resp: 16    Complications: No apparent anesthesia complications

## 2014-08-28 ENCOUNTER — Encounter (HOSPITAL_COMMUNITY): Payer: Self-pay | Admitting: Obstetrics and Gynecology

## 2014-08-28 NOTE — Op Note (Signed)
NAMFranco Collet:  [name], Heather [name]              ACCOUNT NO.:  [id number]  MEDICAL RECORD NO.:  [id number]  LOCATION:  [facility]                          FACILITY:  [facility]  PHYSICIAN:  Guy SandiferJames E. Henderson Cloudomblin, M.D. DATE OF BIRTH:  1985-07-20  DATE OF PROCEDURE:  08/27/2014 DATE OF DISCHARGE:  08/27/2014                              OPERATIVE REPORT   PREOPERATIVE DIAGNOSIS:  Missed abortion at 11 weeks, estimated gestational age.  POSTOPERATIVE DIAGNOSIS:  Missed abortion at 11 weeks, estimated gestational age.  PROCEDURE: 1. Dilation and evacuation. 2. Intraoperative ultrasound evaluation.  SURGEON:  Guy SandiferJames E. Henderson Cloudomblin, M.D.  ANESTHESIA:  General.  ESTIMATED BLOOD LOSS:  150 mL.  SPECIMENS: 1. POC to Pathology. 2. Small portion of POC for karyotype.  INDICATIONS AND CONSENT:  Patient is a 29 year old married white female, G1, P0, who had a missed abortion noted with 11-week crown-rump length. Options have been reviewed.  The patient opts for dilation and evacuation.  Risks were discussed preoperatively including, but not limited to, infection, organ damage, bleeding requiring transfusion of blood products with HIV and hepatitis acquisition, DVT, PE, pneumonia, laparoscopy laparotomy, recurrent bleeding or intrauterine synechiae, and secondary infertility.  The patient states she understands, all questions were answered, and she agrees.  DESCRIPTION OF PROCEDURE:  The patient was taken to the operating room, where she was identified, placed in a dorsal supine position.  General anesthesia was induced.  She was then placed in dorsal lithotomy position.  She was prepped and draped and straight catheterized.  Time- out was undertaken.  Examination reveals the uterus to be about 10-11 weeks size.  Bivalve speculum was placed in the vagina.  Anterior cervical lip was injected with 1% plain Xylocaine and grasped with a single-tooth tenaculum.  Paracervical block was placed at 2, 4, 5, 7, 8, and 10  o'clock positions with approximately 20 mL of the same solution. Cervix was gently progressively dilated.  This allowed us a #10 curved suction curette easily passed without resistance.  Suction curettage was carried out for obvious products of conception.  Repeated suction followed by sharp curettage followed by gentle exploration with the ring forceps was carried out.  Ultrasound evaluation has been done, and the cavity appears clear.  There was about a 2 cm pocket of clot.  However, there was no evidence of retained POCs.  Doppler flow was also negative for any retained products.  A final pass with the suction curette was made.  Good hemostasis was noted.  The patient was given Pitocin in the IV.  After initial pass of the suction curette as well as 0.2 mg of methargen towards the end of the case IM.  Good hemostasis was noted.  All instruments were removed.  All counts were correct.  The patient was transferred to recovery room in stable condition.     Guy SandiferJames E. Henderson Cloudomblin, M.D.     JET/MEDQ  D:  08/27/2014  T:  08/28/2014  Job:  [id number]

## 2014-09-11 LAB — CHROMOSOME STD, POC(TISSUE)-NCBH

## 2014-12-08 ENCOUNTER — Other Ambulatory Visit: Payer: Self-pay | Admitting: Endocrinology

## 2014-12-08 DIAGNOSIS — E010 Iodine-deficiency related diffuse (endemic) goiter: Secondary | ICD-10-CM

## 2014-12-19 ENCOUNTER — Emergency Department (HOSPITAL_COMMUNITY): Payer: 59

## 2014-12-19 ENCOUNTER — Emergency Department (HOSPITAL_COMMUNITY)
Admission: EM | Admit: 2014-12-19 | Discharge: 2014-12-19 | Disposition: A | Discharge status: Home / Self Care | Payer: 59 | Attending: Emergency Medicine | Admitting: Emergency Medicine

## 2014-12-19 ENCOUNTER — Encounter (HOSPITAL_COMMUNITY): Payer: Self-pay

## 2014-12-19 DIAGNOSIS — Z880 Allergy status to penicillin: Secondary | ICD-10-CM | POA: Insufficient documentation

## 2014-12-19 DIAGNOSIS — R1031 Right lower quadrant pain: Secondary | ICD-10-CM | POA: Diagnosis present

## 2014-12-19 DIAGNOSIS — I209 Angina pectoris, unspecified: Secondary | ICD-10-CM | POA: Insufficient documentation

## 2014-12-19 DIAGNOSIS — K219 Gastro-esophageal reflux disease without esophagitis: Secondary | ICD-10-CM | POA: Insufficient documentation

## 2014-12-19 DIAGNOSIS — Z3202 Encounter for pregnancy test, result negative: Secondary | ICD-10-CM | POA: Diagnosis not present

## 2014-12-19 DIAGNOSIS — N83209 Unspecified ovarian cyst, unspecified side: Secondary | ICD-10-CM

## 2014-12-19 DIAGNOSIS — Z8659 Personal history of other mental and behavioral disorders: Secondary | ICD-10-CM | POA: Diagnosis not present

## 2014-12-19 DIAGNOSIS — N83201 Unspecified ovarian cyst, right side: Secondary | ICD-10-CM

## 2014-12-19 DIAGNOSIS — N8320 Unspecified ovarian cysts: Secondary | ICD-10-CM | POA: Insufficient documentation

## 2014-12-19 DIAGNOSIS — R109 Unspecified abdominal pain: Secondary | ICD-10-CM

## 2014-12-19 DIAGNOSIS — Z79899 Other long term (current) drug therapy: Secondary | ICD-10-CM | POA: Insufficient documentation

## 2014-12-19 DIAGNOSIS — R102 Pelvic and perineal pain: Secondary | ICD-10-CM

## 2014-12-19 LAB — COMPREHENSIVE METABOLIC PANEL
ALBUMIN: 4.2 g/dL (ref 3.5–5.0)
ALT: 16 U/L (ref 14–54)
AST: 24 U/L (ref 15–41)
Alkaline Phosphatase: 39 U/L (ref 38–126)
Anion gap: 10 (ref 5–15)
BILIRUBIN TOTAL: 1.6 mg/dL — AB (ref 0.3–1.2)
BUN: 9 mg/dL (ref 6–20)
CO2: 22 mmol/L (ref 22–32)
Calcium: 9.4 mg/dL (ref 8.9–10.3)
Chloride: 105 mmol/L (ref 101–111)
Creatinine, Ser: 0.85 mg/dL (ref 0.44–1.00)
GFR calc Af Amer: >60 mL/min (ref >60)
Glucose, Bld: 146 mg/dL — ABNORMAL HIGH (ref 65–99)
Potassium: 3.4 mmol/L — ABNORMAL LOW (ref 3.5–5.1)
Sodium: 137 mmol/L (ref 135–145)
TOTAL PROTEIN: 6.5 g/dL (ref 6.5–8.1)

## 2014-12-19 LAB — CBC WITH DIFFERENTIAL/PLATELET
BASOS ABS: 0 10*3/uL (ref 0.0–0.1)
Basophils Relative: 0 % (ref 0–1)
Eosinophils Absolute: 0.1 10*3/uL (ref 0.0–0.7)
Eosinophils Relative: 2 % (ref 0–5)
HCT: 41 % (ref 36.0–46.0)
HEMOGLOBIN: 14.7 g/dL (ref 12.0–15.0)
Lymphocytes Relative: 41 % (ref 12–46)
Lymphs Abs: 3 10*3/uL (ref 0.7–4.0)
MCH: 29.5 pg (ref 26.0–34.0)
MCHC: 35.9 g/dL (ref 30.0–36.0)
MCV: 82.3 fL (ref 78.0–100.0)
Monocytes Absolute: 0.6 10*3/uL (ref 0.1–1.0)
Monocytes Relative: 8 % (ref 3–12)
NEUTROS PCT: 49 % (ref 43–77)
Neutro Abs: 3.6 10*3/uL (ref 1.7–7.7)
PLATELETS: 235 10*3/uL (ref 150–400)
RBC: 4.98 MIL/uL (ref 3.87–5.11)
RDW: 12.3 % (ref 11.5–15.5)
WBC: 7.4 10*3/uL (ref 4.0–10.5)

## 2014-12-19 LAB — WET PREP, GENITAL
CLUE CELLS WET PREP: NONE SEEN
Trich, Wet Prep: NONE SEEN
WBC, Wet Prep HPF POC: NONE SEEN
YEAST WET PREP: NONE SEEN

## 2014-12-19 LAB — URINALYSIS, ROUTINE W REFLEX MICROSCOPIC
Bilirubin Urine: NEGATIVE
Glucose, UA: NEGATIVE mg/dL
Ketones, ur: >80 mg/dL — AB
LEUKOCYTES UA: NEGATIVE
Nitrite: NEGATIVE
Protein, ur: NEGATIVE mg/dL
Specific Gravity, Urine: 1.019 (ref 1.005–1.030)
Urobilinogen, UA: 0.2 mg/dL (ref 0.0–1.0)
pH: 5.5 (ref 5.0–8.0)

## 2014-12-19 LAB — URINE MICROSCOPIC-ADD ON

## 2014-12-19 LAB — I-STAT BETA HCG BLOOD, ED (MC, WL, AP ONLY): I-stat hCG, quantitative: <5 m[IU]/mL (ref <5)

## 2014-12-19 MED ORDER — HYDROMORPHONE HCL 1 MG/ML IJ SOLN
1.0000 mg | Freq: Once | INTRAMUSCULAR | Status: AC
  Administered 2014-12-19: 1 mg via INTRAVENOUS
  Filled 2014-12-19: qty 1

## 2014-12-19 MED ORDER — ONDANSETRON HCL 4 MG/2ML IJ SOLN
4.0000 mg | Freq: Once | INTRAMUSCULAR | Status: AC
  Administered 2014-12-19: 4 mg via INTRAVENOUS
  Filled 2014-12-19: qty 2

## 2014-12-19 MED ORDER — ONDANSETRON HCL 4 MG PO TABS: 4.0000 mg | ORAL_TABLET | Freq: Three times a day (TID) | ORAL | Status: DC | PRN

## 2014-12-19 MED ORDER — ONDANSETRON HCL 4 MG/2ML IJ SOLN
INTRAMUSCULAR | Status: AC
  Filled 2014-12-19: qty 2

## 2014-12-19 MED ORDER — FENTANYL CITRATE (PF) 100 MCG/2ML IJ SOLN
INTRAMUSCULAR | Status: AC
  Filled 2014-12-19: qty 2

## 2014-12-19 MED ORDER — ONDANSETRON HCL 4 MG/2ML IJ SOLN
4.0000 mg | Freq: Once | INTRAMUSCULAR | Status: AC
  Administered 2014-12-19: 4 mg via INTRAVENOUS

## 2014-12-19 MED ORDER — FENTANYL CITRATE (PF) 100 MCG/2ML IJ SOLN
50.0000 ug | Freq: Once | INTRAMUSCULAR | Status: AC
  Administered 2014-12-19: 50 ug via INTRAVENOUS

## 2014-12-19 MED ORDER — HYDROMORPHONE HCL 1 MG/ML IJ SOLN
2.0000 mg | Freq: Once | INTRAMUSCULAR | Status: AC
  Administered 2014-12-19: 2 mg via INTRAVENOUS
  Filled 2014-12-19: qty 2

## 2014-12-19 MED ORDER — OXYCODONE-ACETAMINOPHEN 5-325 MG PO TABS
1.0000 | ORAL_TABLET | ORAL | Status: DC | PRN
Start: 2014-12-19 — End: 2015-05-20

## 2014-12-19 NOTE — ED Notes (Signed)
Pt woke up this morning with right lower abd pain. Husband states they started trying to get pregnant again 10 days ago. Had a miscarriage in march with a D and C. Pt has had her appendix removed. Has had N/V/D.

## 2014-12-19 NOTE — ED Provider Notes (Signed)
CSN: [id number]     Arrival date & time 12/19/14  [id number] History   First MD Initiated Contact with Patient 12/19/14 [phone]     Chief Complaint  Patient presents with  . Abdominal Pain     (Consider location/radiation/quality/duration/timing/severity/associated sxs/prior Treatment) The history is provided by the patient and the spouse.  '  Pt reports severe sharp RLQ pain that began suddenly this morning, waking her from sleep.  Associated N/V.  She has been attempting to get pregnant, believes she ovulated from the right ovary yesterday.  Has had mild diarrhea and vaginal itching.  Has had vaginal spotting throughout this cycle.   She is taking no fertility medications.  Unsure if she has hx ovarian cyst.  Had missed abortion at [redacted] weeks gestation in March 2016 with D&C.   S/P remote appendectomy.    ObGyn Dr Esperanza RichtersGretchen Atkins Fertility specialist Dr Elesa Hackereaton, [facility].    Past Medical History  Diagnosis Date  . Chest pain   . Shortness of breath dyspnea     with chest pains  . Anginal pain     cardiac workup normal  . Hypothyroidism     borderline  . Anxiety   . Depression   . GERD (gastroesophageal reflux disease)     with pregnancy  . Headache     migraines  . Miscarriage within last 12 months March 2016   Past Surgical History  Procedure Laterality Date  . Laparoscopic appendectomy N/A 01/13/2014    Procedure: APPENDECTOMY LAPAROSCOPIC;  Surgeon: Cherylynn RidgesJames O Wyatt, MD;  [facility];  Service: General;  Laterality: N/A;  . Tooth extraction    . Appendectomy    . Dilation and evacuation N/A 08/27/2014    Procedure: DILATATION AND EVACUATION WITH CHROMOSOMES STUDES, ULTRASOUND;  Surgeon: Harold HedgeJames Tomblin, MD;  Location: [facility];  Service: Gynecology;  Laterality: N/A;   Family History  Problem Relation Age of Onset  . Hypertension Mother   . Diabetes Mother    History  Substance Use Topics  . Smoking status: Never Smoker   . Smokeless tobacco: Never Used  . Alcohol Use: No    OB History    Gravida Para Term Preterm AB TAB SAB Ectopic Multiple Living   1              Review of Systems  All other systems reviewed and are negative.     Allergies  Penicillins  Home Medications   Prior to Admission medications   Medication Sig Start Date End Date Taking? Authorizing Provider  docusate sodium (COLACE) 100 MG capsule Take 100 mg by mouth 2 (two) times daily.    Historical Provider, MD  famotidine (PEPCID) 20 MG tablet Take 20 mg by mouth daily as needed for heartburn or indigestion.    Historical Provider, MD  methylergonovine (METHERGINE) 0.2 MG tablet Take 1 tablet (0.2 mg total) by mouth 3 (three) times daily. 08/27/14   Harold HedgeJames Tomblin, MD  ondansetron (ZOFRAN-ODT) 8 MG disintegrating tablet Take 1 tablet (8 mg total) by mouth every 8 (eight) hours as needed for nausea or vomiting. 08/21/14   Candice Campavid Lowe, MD  oxyCODONE-acetaminophen (ROXICET) 5-325 MG per tablet Take 1-2 tablets by mouth every 6 (six) hours as needed for severe pain. 08/27/14   Harold HedgeJames Tomblin, MD  Prenatal Vit-Fe Fumarate-FA (PRENATAL MULTIVITAMIN) TABS tablet Take 1 tablet by mouth daily at 12 noon.    Historical Provider, MD   BP 138/94 mmHg  Pulse 80  Temp(Src) 97.7 F (  36.5 C) (Oral)  Resp 24  Ht  (1.727 m)  Wt 145 lb (65.772 kg)  BMI 22.05 kg/m2  SpO2 100%  LMP 12/03/2014 (Approximate)  Breastfeeding? Unknown Physical Exam  Constitutional: She appears well-developed and well-nourished. No distress.  HENT:  Head: Normocephalic and atraumatic.  Neck: Neck supple.  Cardiovascular: Normal rate and regular rhythm.   Pulmonary/Chest: Effort normal and breath sounds normal. No respiratory distress. She has no wheezes. She has no rales.  Abdominal: Soft. She exhibits no distension. There is tenderness. There is no rebound and no guarding.  Vague tenderness right lower abdomen/pelvis  Genitourinary: Cervix exhibits no motion tenderness. Right adnexum displays tenderness. Left  adnexum displays no mass and no tenderness. There is bleeding in the vagina. No erythema or tenderness in the vagina. No foreign body around the vagina. No signs of injury around the vagina. No vaginal discharge found.  Pelvic exam performed by Gabriela Eves, PA-S, under my direct supervision.    Neurological: She is alert.  Skin: She is not diaphoretic.  Nursing note and vitals reviewed.   ED Course  Procedures (including critical care time) Labs Review Labs Reviewed  COMPREHENSIVE METABOLIC PANEL - Abnormal; Notable for the following:    Potassium 3.4 (*)    Glucose, Bld 146 (*)    Total Bilirubin 1.6 (*)    All other components within normal limits  URINALYSIS, ROUTINE W REFLEX MICROSCOPIC (NOT AT [facility]) - Abnormal; Notable for the following:    APPearance CLOUDY (*)    Hgb urine dipstick SMALL (*)    Ketones, ur >80 (*)    All other components within normal limits  URINE MICROSCOPIC-ADD ON - Abnormal; Notable for the following:    Squamous Epithelial / LPF MANY (*)    All other components within normal limits  WET PREP, GENITAL  CBC WITH DIFFERENTIAL/PLATELET  HIV ANTIBODY (ROUTINE TESTING)  I-STAT BETA HCG BLOOD, ED ([facility], [facility], [facility] ONLY)  GC/CHLAMYDIA PROBE AMP () NOT AT [facility]    Imaging Review US Transvaginal Non-ob  12/19/2014   CLINICAL DATA:  Right adnexal pain  EXAM: TRANSABDOMINAL AND TRANSVAGINAL ULTRASOUND OF PELVIS  DOPPLER ULTRASOUND OF OVARIES  TECHNIQUE: Both transabdominal and transvaginal ultrasound examinations of the pelvis were performed. Transabdominal technique was performed for global imaging of the pelvis including uterus, ovaries, adnexal regions, and pelvic cul-de-sac.  It was necessary to proceed with endovaginal exam following the transabdominal exam to visualize the endometrium and ovaries. Color and duplex Doppler ultrasound was utilized to evaluate blood flow to the ovaries.  COMPARISON:  None.  FINDINGS: Uterus  Measurements: 8.7 x 5.3 x  7.4 cm. No fibroids or other mass visualized.  Endometrium  Thickness: 10 mm.  No focal abnormality visualized.  Right ovary  Measurements: 4.2 x 3.2 x 2.7 cm. Hemorrhagic cyst or follicle measures 2.6 cm. No adnexal mass.  Left ovary  Measurements: 2.9 x 2.1 x 2.3 cm. Multiple follicles. Normal appearance/no adnexal mass.  Pulsed Doppler evaluation of both ovaries demonstrates normal low-resistance arterial and venous waveforms.  Other findings  Small amount of free fluid in the right adnexa  IMPRESSION: Hemorrhagic dominant follicle or small cyst in the right ovary, 2.6 cm.  Otherwise unremarkable study.  No evidence of torsion.   Electronically Signed   By: Charlett Nose M.D.   On: 12/19/2014 09:49   US Pelvis Complete  12/19/2014   CLINICAL DATA:  Right adnexal pain  EXAM: TRANSABDOMINAL AND TRANSVAGINAL ULTRASOUND OF PELVIS  DOPPLER ULTRASOUND OF OVARIES  TECHNIQUE: Both transabdominal and transvaginal ultrasound examinations of the pelvis were performed. Transabdominal technique was performed for global imaging of the pelvis including uterus, ovaries, adnexal regions, and pelvic cul-de-sac.  It was necessary to proceed with endovaginal exam following the transabdominal exam to visualize the endometrium and ovaries. Color and duplex Doppler ultrasound was utilized to evaluate blood flow to the ovaries.  COMPARISON:  None.  FINDINGS: Uterus  Measurements: 8.7 x 5.3 x 7.4 cm. No fibroids or other mass visualized.  Endometrium  Thickness: 10 mm.  No focal abnormality visualized.  Right ovary  Measurements: 4.2 x 3.2 x 2.7 cm. Hemorrhagic cyst or follicle measures 2.6 cm. No adnexal mass.  Left ovary  Measurements: 2.9 x 2.1 x 2.3 cm. Multiple follicles. Normal appearance/no adnexal mass.  Pulsed Doppler evaluation of both ovaries demonstrates normal low-resistance arterial and venous waveforms.  Other findings  Small amount of free fluid in the right adnexa  IMPRESSION: Hemorrhagic dominant follicle or small cyst  in the right ovary, 2.6 cm.  Otherwise unremarkable study.  No evidence of torsion.   Electronically Signed   By: Charlett Nose M.D.   On: 12/19/2014 09:49   [place] Art/ven Flow Abd Pelv Doppler  12/19/2014   CLINICAL DATA:  Right adnexal pain  EXAM: TRANSABDOMINAL AND TRANSVAGINAL ULTRASOUND OF PELVIS  DOPPLER ULTRASOUND OF OVARIES  TECHNIQUE: Both transabdominal and transvaginal ultrasound examinations of the pelvis were performed. Transabdominal technique was performed for global imaging of the pelvis including uterus, ovaries, adnexal regions, and pelvic cul-de-sac.  It was necessary to proceed with endovaginal exam following the transabdominal exam to visualize the endometrium and ovaries. Color and duplex Doppler ultrasound was utilized to evaluate blood flow to the ovaries.  COMPARISON:  None.  FINDINGS: Uterus  Measurements: 8.7 x 5.3 x 7.4 cm. No fibroids or other mass visualized.  Endometrium  Thickness: 10 mm.  No focal abnormality visualized.  Right ovary  Measurements: 4.2 x 3.2 x 2.7 cm. Hemorrhagic cyst or follicle measures 2.6 cm. No adnexal mass.  Left ovary  Measurements: 2.9 x 2.1 x 2.3 cm. Multiple follicles. Normal appearance/no adnexal mass.  Pulsed Doppler evaluation of both ovaries demonstrates normal low-resistance arterial and venous waveforms.  Other findings  Small amount of free fluid in the right adnexa  IMPRESSION: Hemorrhagic dominant follicle or small cyst in the right ovary, 2.6 cm.  Otherwise unremarkable study.  No evidence of torsion.   Electronically Signed   By: Charlett Nose M.D.   On: 12/19/2014 09:49     EKG Interpretation None      MDM   Final diagnoses:  Right sided abdominal pain  Cyst of right ovary  Hemorrhagic cyst of ovary    Afebrile, nontoxic patient with sudden RLQ pain this morning.  Found to have hemorrhagic corpus luteum vs cyst.  Pt believes she ovulated yesterday.   Labs and UA unremarkable.  Pregnancy test is negative.  [place] negative for torsion.   D/C home with pain and nausea medication, gyn follow up.   Discussed result, findings, treatment, and follow up  with patient.  Pt given return precautions.  Pt verbalizes understanding and agrees with plan.        Trixie Dredge, PA-C 12/19/14 1519  Elwin Mocha, MD 12/19/14 1540

## 2014-12-19 NOTE — ED Notes (Signed)
Husband in U/S with pt

## 2014-12-19 NOTE — ED Notes (Signed)
PA student at the bedside.  

## 2014-12-19 NOTE — ED Notes (Signed)
Pt returned from US

## 2014-12-19 NOTE — ED Notes (Signed)
Emily, PA at the bedside . 

## 2014-12-19 NOTE — Discharge Instructions (Signed)
Read the information below.  Use the prescribed medication as directed.  Please discuss all new medications with your pharmacist.  Do not take additional tylenol while taking the prescribed pain medication to avoid overdose.  You may return to the Emergency Department at any time for worsening condition or any new symptoms that concern you.   If you develop high fevers, worsening abdominal pain, uncontrolled vomiting, or are unable to tolerate fluids by mouth, return to the ER for a recheck.     Ovarian Cyst An ovarian cyst is a fluid-filled sac that forms on an ovary. The ovaries are small organs that produce eggs in women. Various types of cysts can form on the ovaries. Most are not cancerous. Many do not cause problems, and they often go away on their own. Some may cause symptoms and require treatment. Common types of ovarian cysts include:  Functional cysts--These cysts may occur every month during the menstrual cycle. This is normal. The cysts usually go away with the next menstrual cycle if the woman does not get pregnant. Usually, there are no symptoms with a functional cyst.  Endometrioma cysts--These cysts form from the tissue that lines the uterus. They are also called "chocolate cysts" because they become filled with blood that turns brown. This type of cyst can cause pain in the lower abdomen during intercourse and with your menstrual period.  Cystadenoma cysts--This type develops from the cells on the outside of the ovary. These cysts can get very big and cause lower abdomen pain and pain with intercourse. This type of cyst can twist on itself, cut off its blood supply, and cause severe pain. It can also easily rupture and cause a lot of pain.  Dermoid cysts--This type of cyst is sometimes found in both ovaries. These cysts may contain different kinds of body tissue, such as skin, teeth, hair, or cartilage. They usually do not cause symptoms unless they get very big.  Theca lutein  cysts--These cysts occur when too much of a certain hormone (human chorionic gonadotropin) is produced and overstimulates the ovaries to produce an egg. This is most common after procedures used to assist with the conception of a baby (in vitro fertilization). CAUSES   Fertility drugs can cause a condition in which multiple large cysts are formed on the ovaries. This is called ovarian hyperstimulation syndrome.  A condition called polycystic ovary syndrome can cause hormonal imbalances that can lead to nonfunctional ovarian cysts. SIGNS AND SYMPTOMS  Many ovarian cysts do not cause symptoms. If symptoms are present, they may include:  Pelvic pain or pressure.  Pain in the lower abdomen.  Pain during sexual intercourse.  Increasing girth (swelling) of the abdomen.  Abnormal menstrual periods.  Increasing pain with menstrual periods.  Stopping having menstrual periods without being pregnant. DIAGNOSIS  These cysts are commonly found during a routine or annual pelvic exam. Tests may be ordered to find out more about the cyst. These tests may include:  Ultrasound.  X-ray of the pelvis.  CT scan.  MRI.  Blood tests. TREATMENT  Many ovarian cysts go away on their own without treatment. Your health care provider may want to check your cyst regularly for 2-3 months to see if it changes. For women in menopause, it is particularly important to monitor a cyst closely because of the higher rate of ovarian cancer in menopausal women. When treatment is needed, it may include any of the following:  A procedure to drain the cyst (aspiration). This may be  done using a long needle and ultrasound. It can also be done through a laparoscopic procedure. This involves using a thin, lighted tube with a tiny camera on the end (laparoscope) inserted through a small incision.  Surgery to remove the whole cyst. This may be done using laparoscopic surgery or an open surgery involving a larger incision in  the lower abdomen.  Hormone treatment or birth control pills. These methods are sometimes used to help dissolve a cyst. HOME CARE INSTRUCTIONS   Only take over-the-counter or prescription medicines as directed by your health care provider.  Follow up with your health care provider as directed.  Get regular pelvic exams and Pap tests. SEEK MEDICAL CARE IF:   Your periods are late, irregular, or painful, or they stop.  Your pelvic pain or abdominal pain does not go away.  Your abdomen becomes larger or swollen.  You have pressure on your bladder or trouble emptying your bladder completely.  You have pain during sexual intercourse.  You have feelings of fullness, pressure, or discomfort in your stomach.  You lose weight for no apparent reason.  You feel generally ill.  You become constipated.  You lose your appetite.  You develop acne.  You have an increase in body and facial hair.  You are gaining weight, without changing your exercise and eating habits.  You think you are pregnant. SEEK IMMEDIATE MEDICAL CARE IF:   You have increasing abdominal pain.  You feel sick to your stomach (nauseous), and you throw up (vomit).  You develop a fever that comes on suddenly.  You have abdominal pain during a bowel movement.  Your menstrual periods become heavier than usual. MAKE SURE YOU:  Understand these instructions.  Will watch your condition.  Will get help right away if you are not doing well or get worse. Document Released: 06/05/2005 Document Revised: 06/10/2013 Document Reviewed: 02/10/2013 [facility] Patient Information 2015 [name], [place]. This information is not intended to replace advice given to you by your health care provider. Make sure you discuss any questions you have with your health care provider.  Pelvic Pain Female pelvic pain can be caused by many different things and start from a variety of places. Pelvic pain refers to pain that is located in  the lower half of the abdomen and between your hips. The pain may occur over a short period of time (acute) or may be reoccurring (chronic). The cause of pelvic pain may be related to disorders affecting the female reproductive organs (gynecologic), but it may also be related to the bladder, kidney stones, an intestinal complication, or muscle or skeletal problems. Getting help right away for pelvic pain is important, especially if there has been severe, sharp, or a sudden onset of unusual pain. It is also important to get help right away because some types of pelvic pain can be life threatening.  CAUSES  Below are only some of the causes of pelvic pain. The causes of pelvic pain can be in one of several categories.   Gynecologic.  Pelvic inflammatory disease.  Sexually transmitted infection.  Ovarian cyst or a twisted ovarian ligament (ovarian torsion).  Uterine lining that grows outside the uterus (endometriosis).  Fibroids, cysts, or tumors.  Ovulation.  Pregnancy.  Pregnancy that occurs outside the uterus (ectopic pregnancy).  Miscarriage.  Labor.  Abruption of the placenta or ruptured uterus.  Infection.  Uterine infection (endometritis).  Bladder infection.  Diverticulitis.  Miscarriage related to a uterine infection (septic abortion).  Bladder.  Inflammation  of the bladder (cystitis).  Kidney stone(s).  Gastrointestinal.  Constipation.  Diverticulitis.  Neurologic.  Trauma.  Feeling pelvic pain because of mental or emotional causes (psychosomatic).  Cancers of the bowel or pelvis. EVALUATION  Your caregiver will want to take a careful history of your concerns. This includes recent changes in your health, a careful gynecologic history of your periods (menses), and a sexual history. Obtaining your family history and medical history is also important. Your caregiver may suggest a pelvic exam. A pelvic exam will help identify the location and severity of  the pain. It also helps in the evaluation of which organ system may be involved. In order to identify the cause of the pelvic pain and be properly treated, your caregiver may order tests. These tests may include:   A pregnancy test.  Pelvic ultrasonography.  An X-ray exam of the abdomen.  A urinalysis or evaluation of vaginal discharge.  Blood tests. HOME CARE INSTRUCTIONS   Only take over-the-counter or prescription medicines for pain, discomfort, or fever as directed by your caregiver.   Rest as directed by your caregiver.   Eat a balanced diet.   Drink enough fluids to make your urine clear or pale yellow, or as directed.   Avoid sexual intercourse if it causes pain.   Apply warm or cold compresses to the lower abdomen depending on which one helps the pain.   Avoid stressful situations.   Keep a journal of your pelvic pain. Write down when it started, where the pain is located, and if there are things that seem to be associated with the pain, such as food or your menstrual cycle.  Follow up with your caregiver as directed.  SEEK MEDICAL CARE IF:  Your medicine does not help your pain.  You have abnormal vaginal discharge. SEEK IMMEDIATE MEDICAL CARE IF:   You have heavy bleeding from the vagina.   Your pelvic pain increases.   You feel light-headed or faint.   You have chills.   You have pain with urination or blood in your urine.   You have uncontrolled diarrhea or vomiting.   You have a fever or persistent symptoms for more than 3 days.  You have a fever and your symptoms suddenly get worse.   You are being physically or sexually abused.  MAKE SURE YOU:  Understand these instructions.  Will watch your condition.  Will get help if you are not doing well or get worse. Document Released: 05/02/2004 Document Revised: 10/20/2013 Document Reviewed: 09/25/2011 [facility] Patient Information 2015 [place], [place]. This information is not  intended to replace advice given to you by your health care provider. Make sure you discuss any questions you have with your health care provider.

## 2014-12-20 LAB — HIV ANTIBODY (ROUTINE TESTING W REFLEX): HIV Screen 4th Generation wRfx: NONREACTIVE

## 2014-12-22 LAB — GC/CHLAMYDIA PROBE AMP (~~LOC~~) NOT AT ARMC
CHLAMYDIA, DNA PROBE: NEGATIVE
NEISSERIA GONORRHEA: NEGATIVE

## 2015-03-31 ENCOUNTER — Ambulatory Visit
Admission: RE | Admit: 2015-03-31 | Discharge: 2015-03-31 | Disposition: A | Discharge status: Home / Self Care | Payer: 59 | Source: Ambulatory Visit | Attending: Endocrinology | Admitting: Endocrinology

## 2015-03-31 DIAGNOSIS — E010 Iodine-deficiency related diffuse (endemic) goiter: Secondary | ICD-10-CM

## 2015-05-20 ENCOUNTER — Inpatient Hospital Stay (HOSPITAL_COMMUNITY)
Admission: AD | Admit: 2015-05-20 | Discharge: 2015-05-20 | Disposition: A | Discharge status: Home / Self Care | Payer: 59 | Source: Ambulatory Visit | Attending: Obstetrics and Gynecology | Admitting: Obstetrics and Gynecology

## 2015-05-20 ENCOUNTER — Encounter (HOSPITAL_COMMUNITY): Payer: Self-pay | Admitting: *Deleted

## 2015-05-20 DIAGNOSIS — Z3A08 8 weeks gestation of pregnancy: Secondary | ICD-10-CM | POA: Insufficient documentation

## 2015-05-20 DIAGNOSIS — O26891 Other specified pregnancy related conditions, first trimester: Secondary | ICD-10-CM | POA: Insufficient documentation

## 2015-05-20 DIAGNOSIS — O210 Mild hyperemesis gravidarum: Secondary | ICD-10-CM | POA: Insufficient documentation

## 2015-05-20 DIAGNOSIS — K219 Gastro-esophageal reflux disease without esophagitis: Secondary | ICD-10-CM | POA: Insufficient documentation

## 2015-05-20 DIAGNOSIS — O99611 Diseases of the digestive system complicating pregnancy, first trimester: Secondary | ICD-10-CM | POA: Diagnosis not present

## 2015-05-20 DIAGNOSIS — O219 Vomiting of pregnancy, unspecified: Secondary | ICD-10-CM

## 2015-05-20 DIAGNOSIS — K117 Disturbances of salivary secretion: Secondary | ICD-10-CM

## 2015-05-20 HISTORY — DX: Autoimmune thyroiditis: E06.3

## 2015-05-20 LAB — COMPREHENSIVE METABOLIC PANEL
ALT: 14 U/L (ref 14–54)
AST: 19 U/L (ref 15–41)
Albumin: 4.4 g/dL (ref 3.5–5.0)
Alkaline Phosphatase: 33 U/L — ABNORMAL LOW (ref 38–126)
Anion gap: 9 (ref 5–15)
BUN: 10 mg/dL (ref 6–20)
CALCIUM: 10.7 mg/dL — AB (ref 8.9–10.3)
CO2: 20 mmol/L — ABNORMAL LOW (ref 22–32)
CREATININE: 0.56 mg/dL (ref 0.44–1.00)
Chloride: 109 mmol/L (ref 101–111)
Glucose, Bld: 130 mg/dL — ABNORMAL HIGH (ref 65–99)
Potassium: 3.7 mmol/L (ref 3.5–5.1)
SODIUM: 138 mmol/L (ref 135–145)
TOTAL PROTEIN: 6.9 g/dL (ref 6.5–8.1)
Total Bilirubin: 2.2 mg/dL — ABNORMAL HIGH (ref 0.3–1.2)

## 2015-05-20 LAB — URINE MICROSCOPIC-ADD ON

## 2015-05-20 LAB — URINALYSIS, ROUTINE W REFLEX MICROSCOPIC
GLUCOSE, UA: NEGATIVE mg/dL
Ketones, ur: 40 mg/dL — AB
LEUKOCYTES UA: NEGATIVE
Nitrite: NEGATIVE
PH: 6 (ref 5.0–8.0)
Protein, ur: 30 mg/dL — AB

## 2015-05-20 LAB — OB RESULTS CONSOLE HIV ANTIBODY (ROUTINE TESTING): HIV: NONREACTIVE

## 2015-05-20 LAB — OB RESULTS CONSOLE ANTIBODY SCREEN: ANTIBODY SCREEN: NEGATIVE

## 2015-05-20 LAB — OB RESULTS CONSOLE RPR: RPR: NONREACTIVE

## 2015-05-20 LAB — OB RESULTS CONSOLE RUBELLA ANTIBODY, IGM: RUBELLA: IMMUNE

## 2015-05-20 LAB — OB RESULTS CONSOLE GC/CHLAMYDIA
CHLAMYDIA, DNA PROBE: NEGATIVE
GC PROBE AMP, GENITAL: NEGATIVE

## 2015-05-20 LAB — OB RESULTS CONSOLE HEPATITIS B SURFACE ANTIGEN: HEP B S AG: NEGATIVE

## 2015-05-20 LAB — POCT PREGNANCY, URINE: Preg Test, Ur: POSITIVE — AB

## 2015-05-20 LAB — OB RESULTS CONSOLE ABO/RH: RH Type: POSITIVE

## 2015-05-20 MED ORDER — ONDANSETRON 4 MG PO TBDP: 4.0000 mg | ORAL_TABLET | Freq: Three times a day (TID) | ORAL | Status: DC | PRN

## 2015-05-20 MED ORDER — FAMOTIDINE IN NACL 20-0.9 MG/50ML-% IV SOLN
20.0000 mg | Freq: Once | INTRAVENOUS | Status: AC
  Administered 2015-05-20: 20 mg via INTRAVENOUS
  Filled 2015-05-20: qty 50

## 2015-05-20 MED ORDER — ONDANSETRON HCL 40 MG/20ML IJ SOLN
8.0000 mg | Freq: Once | INTRAMUSCULAR | Status: AC
  Administered 2015-05-20: 8 mg via INTRAVENOUS
  Filled 2015-05-20: qty 4

## 2015-05-20 MED ORDER — RANITIDINE HCL 150 MG PO TABS: 150.0000 mg | ORAL_TABLET | Freq: Two times a day (BID) | ORAL | Status: DC

## 2015-05-20 MED ORDER — METOCLOPRAMIDE HCL 5 MG/ML IJ SOLN
10.0000 mg | Freq: Once | INTRAMUSCULAR | Status: AC
  Administered 2015-05-20: 10 mg via INTRAVENOUS
  Filled 2015-05-20: qty 2

## 2015-05-20 MED ORDER — METOCLOPRAMIDE HCL 10 MG PO TABS: 10.0000 mg | ORAL_TABLET | Freq: Three times a day (TID) | ORAL | Status: DC

## 2015-05-20 MED ORDER — DEXTROSE 5 % IN LACTATED RINGERS IV BOLUS
1000.0000 mL | Freq: Once | INTRAVENOUS | Status: AC
  Administered 2015-05-20: 1000 mL via INTRAVENOUS

## 2015-05-20 NOTE — MAU Provider Note (Signed)
History     CSN: [id number]  Arrival date and time: 05/20/15 1100   First Provider Initiated Contact with Patient 05/20/15 1245      Chief Complaint  Patient presents with  . Morning Sickness   HPI   Ms.Heather [name] is a 29 y.o. female G3P0020 at 35w5dpresenting to MAU with vomiting. She has vomited 4 times per day on average since she found out she was pregnant. She had severe hyperemesis with her last pregnancy.   She is currently taking Zofran, and Reglan. 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   Patient is worried about how much weight she has lost> 11 lbs in this pregnancy.   + GERD symptoms. She is not taking anything for the symptoms other than Tums.   OB History    Gravida Para Term Preterm AB TAB SAB Ectopic Multiple Living   '3    2  2         ' Past Medical History  Diagnosis Date  . Chest pain   . Shortness of breath dyspnea     with chest pains  . Anginal pain [place]     cardiac workup normal  . Hypothyroidism     borderline  . Anxiety   . Depression   . GERD (gastroesophageal reflux disease)     with pregnancy  . Headache     migraines  . Miscarriage within last 12 months March 2016  . Hashimoto's disease     Past Surgical History  Procedure Laterality Date  . Laparoscopic appendectomy N/A 01/13/2014    Procedure: APPENDECTOMY LAPAROSCOPIC;  Surgeon: JGwenyth Ober MD;  [place] [facility]  Service: General;  Laterality: N/A;  . Tooth extraction    . Appendectomy    . Dilation and evacuation N/A 08/27/2014    Procedure: DILATATION AND EVACUATION WITH CHROMOSOMES STUDES, ULTRASOUND;  Surgeon: JEverlene Farrier MD;  [facility] [facility];  Service: Gynecology;  Laterality: N/A;    Family History  Problem Relation Age of Onset  . Hypertension Mother   . Diabetes Mother     Social History  Substance Use Topics  . Smoking status: Never Smoker   . Smokeless tobacco: Never Used  . Alcohol Use: No    Allergies:  Allergies  Allergen Reactions  .  Penicillins Other (See Comments)    Unknown reaction; precaution; entire family allergic    Prescriptions prior to admission  Medication Sig Dispense Refill Last Dose  . docusate sodium (COLACE) 100 MG capsule Take 100 mg by mouth 2 (two) times daily.     . famotidine (PEPCID) 20 MG tablet Take 20 mg by mouth daily as needed for heartburn or indigestion.   08/18/2014 at Unknown time  . methylergonovine (METHERGINE) 0.2 MG tablet Take 1 tablet (0.2 mg total) by mouth 3 (three) times daily. 6 tablet 0   . ondansetron (ZOFRAN) 4 MG tablet Take 1 tablet (4 mg total) by mouth every 8 (eight) hours as needed for nausea or vomiting. 15 tablet 0   . ondansetron (ZOFRAN-ODT) 8 MG disintegrating tablet Take 1 tablet (8 mg total) by mouth every 8 (eight) hours as needed for nausea or vomiting. 20 tablet 3   . oxyCODONE-acetaminophen (PERCOCET/ROXICET) 5-325 MG per tablet Take 1-2 tablets by mouth every 4 (four) hours as needed for severe pain. 15 tablet 0   . Prenatal Vit-Fe Fumarate-FA (PRENATAL MULTIVITAMIN) TABS tablet Take 1 tablet by mouth daily at 12 noon.       Results  for orders placed or performed during the hospital encounter of 05/20/15 (from the past 48 hour(s))  Urinalysis, Routine w reflex microscopic (not at [facility])     Status: Abnormal   Collection Time: 05/20/15 11:40 AM  Result Value Ref Range   Color, Urine YELLOW YELLOW   APPearance CLOUDY (A) CLEAR   Specific Gravity, Urine >1.030 (H) 1.005 - 1.030   pH 6.0 5.0 - 8.0   Glucose, UA NEGATIVE NEGATIVE mg/dL   Hgb urine dipstick TRACE (A) NEGATIVE   Bilirubin Urine SMALL (A) NEGATIVE   Ketones, ur 40 (A) NEGATIVE mg/dL   Protein, ur 30 (A) NEGATIVE mg/dL   Nitrite NEGATIVE NEGATIVE   Leukocytes, UA NEGATIVE NEGATIVE  Urine microscopic-add on     Status: Abnormal   Collection Time: 05/20/15 11:40 AM  Result Value Ref Range   Squamous Epithelial / LPF 6-30 (A) NONE SEEN   WBC, UA 0-5 0 - 5 WBC/hpf   RBC / HPF 0-5 0 - 5 RBC/hpf    Bacteria, UA FEW (A) NONE SEEN   Crystals CA OXALATE CRYSTALS (A) NEGATIVE   Urine-Other MUCOUS PRESENT   Pregnancy, urine POC     Status: Abnormal   Collection Time: 05/20/15 11:52 AM  Result Value Ref Range   Preg Test, Ur POSITIVE (A) NEGATIVE    Comment:        THE SENSITIVITY OF THIS METHODOLOGY IS >24 mIU/mL   Comprehensive metabolic panel     Status: Abnormal   Collection Time: 05/20/15  1:08 PM  Result Value Ref Range   Sodium 138 135 - 145 mmol/L   Potassium 3.7 3.5 - 5.1 mmol/L   Chloride 109 101 - 111 mmol/L   CO2 20 (L) 22 - 32 mmol/L   Glucose, Bld 130 (H) 65 - 99 mg/dL   BUN 10 6 - 20 mg/dL   Creatinine, Ser 0.56 0.44 - 1.00 mg/dL   Calcium 10.7 (H) 8.9 - 10.3 mg/dL   Total Protein 6.9 6.5 - 8.1 g/dL   Albumin 4.4 3.5 - 5.0 g/dL   AST 19 15 - 41 U/L   ALT 14 14 - 54 U/L   Alkaline Phosphatase 33 (L) 38 - 126 U/L   Total Bilirubin 2.2 (H) 0.3 - 1.2 mg/dL   GFR calc non Af Amer >60 >60 mL/min   GFR calc Af Amer >60 >60 mL/min    Comment: (NOTE) The eGFR has been calculated using the CKD EPI equation. This calculation has not been validated in all clinical situations. eGFR's persistently <60 mL/min signify possible Chronic Kidney Disease.    Anion gap 9 5 - 15    Review of Systems  Constitutional: Negative for fever.  Gastrointestinal: Positive for heartburn, nausea and vomiting.  Genitourinary: Negative for dysuria, urgency and frequency.   Physical Exam   Blood pressure 103/69, pulse 97, temperature 98.5 F (36.9 C), resp. rate 18, weight 133 lb 3.2 oz (60.419 kg), last menstrual period 03/20/2015, unknown if currently breastfeeding.  Physical Exam  Constitutional: She is oriented to person, place, and time. She appears well-developed and well-nourished. No distress.  HENT:  Head: Normocephalic.  Eyes: Pupils are equal, round, and reactive to light.  Neck: Neck supple.  Cardiovascular: Normal rate and normal heart sounds.   Respiratory: Effort  normal.  GI: Soft.  Musculoskeletal: Normal range of motion.  Neurological: She is alert and oriented to person, place, and time.  Skin: Skin is warm. She is not diaphoretic. There is pallor.  Psychiatric: Her behavior is normal.    MAU Course  Procedures  None  MDM  Urine culture pending  Pepcid IV Reglan IV Zofran IV D5LR bolus X 1 bag Discussed patient with Dr. Matthew Saras at 1415, Dr. Matthew Saras does not recommended treatment of ptyalism with robinul.  Patient tolerating PO Fluids   Assessment and Plan   A:  1. Hyperemesis affecting pregnancy, antepartum   2. Gastroesophageal reflux disease, esophagitis presence not specified   3. Ptyalism    P:  Discharge home in stable condition Return to MAU if symptoms worsen  Small, frequent meals RX: Zantac, Zofran ODT. Stop zofran tablets  Lezlie Lye, NP 05/20/2015 12:48 PM

## 2015-05-20 NOTE — Discharge Instructions (Signed)
Eating Plan for Hyperemesis Gravidarum °Severe cases of hyperemesis gravidarum can lead to dehydration and malnutrition. The hyperemesis eating plan is one way to lessen the symptoms of nausea and vomiting. It is often used with prescribed medicines to control your symptoms.  °WHAT CAN I DO TO RELIEVE MY SYMPTOMS? °Listen to your body. Everyone is different and has different preferences. Find what works best for you. Some of the following things may help: °· Eat and drink slowly. °· Eat 5-6 small meals daily instead of 3 large meals.   °· Eat crackers before you get out of bed in the morning.   °· Starchy foods are usually well tolerated (such as cereal, toast, bread, potatoes, pasta, rice, and pretzels).   °· Ginger may help with nausea. Add ¼ tsp ground ginger to hot tea or choose ginger tea.   °· Try drinking 100% fruit juice or an electrolyte drink. °· Continue to take your prenatal vitamins as directed by your health care provider. If you are having trouble taking your prenatal vitamins, talk with your health care provider about different options. °· Include at least 1 serving of protein with your meals and snacks (such as meats or poultry, beans, nuts, eggs, or yogurt). Try eating a protein-rich snack before bed (such as cheese and crackers or a half turkey or peanut butter sandwich). °WHAT THINGS SHOULD I AVOID TO REDUCE MY SYMPTOMS? °The following things may help reduce your symptoms: °· Avoid foods with strong smells. Try eating meals in well-ventilated areas that are free of odors. °· Avoid drinking water or other beverages with meals. Try not to drink anything less than 30 minutes before and after meals. °· Avoid drinking more than 1 cup of fluid at a time. °· Avoid fried or high-fat foods, such as butter and cream sauces. °· Avoid spicy foods. °· Avoid skipping meals the best you can. Nausea can be more intense on an empty stomach. If you cannot tolerate food at that time, do not force it. Try sucking on  ice chips or other frozen items and make up the calories later. °· Avoid lying down within 2 hours after eating. °  °This information is not intended to replace advice given to you by your health care provider. Make sure you discuss any questions you have with your health care provider. °  °Document Released: 04/02/2007 Document Revised: 06/10/2013 Document Reviewed: 04/09/2013 °Elsevier Interactive Patient Education ©2016 [employer] ° °

## 2015-05-20 NOTE — MAU Note (Signed)
Pt presents to MAU with complaints of nausea and vomiting stating that she has lost 11 pounds already with this pregnancy. Pt is taking zofran and reglan.

## 2015-05-21 ENCOUNTER — Inpatient Hospital Stay (HOSPITAL_COMMUNITY)
Admission: AD | Admit: 2015-05-21 | Discharge: 2015-05-21 | Disposition: A | Discharge status: Home / Self Care | Payer: 59 | Source: Ambulatory Visit | Attending: Obstetrics and Gynecology | Admitting: Obstetrics and Gynecology

## 2015-05-21 ENCOUNTER — Encounter (HOSPITAL_COMMUNITY): Payer: Self-pay

## 2015-05-21 DIAGNOSIS — K117 Disturbances of salivary secretion: Secondary | ICD-10-CM | POA: Insufficient documentation

## 2015-05-21 DIAGNOSIS — O210 Mild hyperemesis gravidarum: Secondary | ICD-10-CM

## 2015-05-21 DIAGNOSIS — Z880 Allergy status to penicillin: Secondary | ICD-10-CM | POA: Diagnosis not present

## 2015-05-21 DIAGNOSIS — O99611 Diseases of the digestive system complicating pregnancy, first trimester: Secondary | ICD-10-CM | POA: Insufficient documentation

## 2015-05-21 DIAGNOSIS — Z3A08 8 weeks gestation of pregnancy: Secondary | ICD-10-CM | POA: Diagnosis not present

## 2015-05-21 DIAGNOSIS — K5900 Constipation, unspecified: Secondary | ICD-10-CM

## 2015-05-21 LAB — URINALYSIS, ROUTINE W REFLEX MICROSCOPIC
Bilirubin Urine: NEGATIVE
GLUCOSE, UA: NEGATIVE mg/dL
HGB URINE DIPSTICK: NEGATIVE
KETONES UR: 40 mg/dL — AB
Leukocytes, UA: NEGATIVE
Nitrite: NEGATIVE
Protein, ur: NEGATIVE mg/dL
Specific Gravity, Urine: 1.025 (ref 1.005–1.030)
pH: 6.5 (ref 5.0–8.0)

## 2015-05-21 LAB — URINE CULTURE: Special Requests: NORMAL

## 2015-05-21 MED ORDER — DEXTROSE IN LACTATED RINGERS 5 % IV SOLN
Freq: Once | INTRAVENOUS | Status: AC
  Administered 2015-05-21: 16:00:00 via INTRAVENOUS
  Filled 2015-05-21: qty 1000

## 2015-05-21 MED ORDER — FAMOTIDINE IN NACL 20-0.9 MG/50ML-% IV SOLN
20.0000 mg | Freq: Once | INTRAVENOUS | Status: AC
  Administered 2015-05-21: 20 mg via INTRAVENOUS
  Filled 2015-05-21: qty 50

## 2015-05-21 MED ORDER — SODIUM CHLORIDE 0.9 % IV SOLN: 4.0000 mg | Freq: Once | INTRAVENOUS | Status: DC

## 2015-05-21 MED ORDER — METOCLOPRAMIDE HCL 5 MG/ML IJ SOLN
10.0000 mg | Freq: Once | INTRAMUSCULAR | Status: AC
  Administered 2015-05-21: 10 mg via INTRAVENOUS
  Filled 2015-05-21: qty 2

## 2015-05-21 MED ORDER — ONDANSETRON HCL 4 MG/2ML IJ SOLN
4.0000 mg | Freq: Once | INTRAMUSCULAR | Status: AC
Start: 2015-05-21 — End: 2015-05-21
  Administered 2015-05-21: 4 mg via INTRAVENOUS
  Filled 2015-05-21: qty 2

## 2015-05-21 MED ORDER — GLYCOPYRROLATE 0.2 MG/ML IJ SOLN
0.1000 mg | Freq: Once | INTRAMUSCULAR | Status: AC
  Administered 2015-05-21: 0.1 mg via INTRAVENOUS
  Filled 2015-05-21: qty 0.5

## 2015-05-21 MED ORDER — LACTATED RINGERS IV SOLN
Freq: Once | INTRAVENOUS | Status: AC
  Administered 2015-05-21: 19:00:00 via INTRAVENOUS
  Filled 2015-05-21: qty 1000

## 2015-05-21 NOTE — MAU Provider Note (Signed)
History     CSN: [id number]  Arrival date and time: 05/21/15 1319   First Provider Initiated Contact with Patient 05/21/15 1442      Chief Complaint  Patient presents with  . Hyperemesis Gravidarum   HPI  Pt G3P0020 at [redacted]w[redacted]d who presents with hyperemesis.  Pt was here yesterday and given IVF, zofran, Reglan and Pepcid.  On way home after visit here, pt threw up [place].  Pt tried to eat toast and fruit and threw up.  Pt states she slept off an on, waking up about every hour last night.  Pt called office this morning and was told to come in for IVF. Pt denies cramping, spotting, bleeding or UTI sx. Pt also reports increased saliva which is very troubling for pt.   Pt has hx of 2 previous SAB- one at 11 weeks and 1 early around 6 weeks. Pt states she had N&V with 1st pregnancy also as well as ptylism. Pt states robinul did not work 1st time but pt would like to try again,. Pt has been constipated with hard painful bowel movement yesterday- pt is taking colace RN note:  Has hyperemesis. Was here yesterday- got IV fluids and IV medication. Went home, ate as instructed, has thrown everything she has eaten up. Called office, instructed to come back in for fluids.          Past Medical History  Diagnosis Date  . Chest pain   . Shortness of breath dyspnea     with chest pains  . Anginal pain (HCC)     cardiac workup normal  . Hypothyroidism     borderline  . Anxiety   . Depression   . GERD (gastroesophageal reflux disease)     with pregnancy  . Headache     migraines  . Miscarriage within last 12 months March 2016  . Hashimoto's disease     Past Surgical History  Procedure Laterality Date  . Laparoscopic appendectomy N/A 01/13/2014    Procedure: APPENDECTOMY LAPAROSCOPIC;  Surgeon: Cherylynn Ridges, MD;  Location: [facility] OR;  Service: General;  Laterality: N/A;  . Tooth extraction    . Appendectomy    . Dilation and evacuation N/A 08/27/2014    Procedure: DILATATION AND  EVACUATION WITH CHROMOSOMES STUDES, ULTRASOUND;  Surgeon: Harold Hedge, MD;  Location: [facility] ORS;  Service: Gynecology;  Laterality: N/A;    Family History  Problem Relation Age of Onset  . Hypertension Mother   . Diabetes Mother     Social History  Substance Use Topics  . Smoking status: Never Smoker   . Smokeless tobacco: Never Used  . Alcohol Use: No    Allergies:  Allergies  Allergen Reactions  . Penicillins Other (See Comments)    Unknown reaction; precaution; entire family allergic    Prescriptions prior to admission  Medication Sig Dispense Refill Last Dose  . calcium carbonate (TUMS - DOSED IN MG ELEMENTAL CALCIUM) 500 MG chewable tablet Chew 2 tablets by mouth 3 (three) times daily as needed for indigestion or heartburn.   05/20/2015 at Unknown time  . docusate sodium (COLACE) 100 MG capsule Take 100 mg by mouth 2 (two) times daily.   05/20/2015 at Unknown time  . levothyroxine (SYNTHROID, LEVOTHROID) 50 MCG tablet Take 50-75 mcg by mouth daily before breakfast. Sunday thru Tuesday takes . Wednesday thru Saturday takes .   05/21/2015 at Unknown time  . metoCLOPramide (REGLAN) 10 MG tablet Take 1 tablet (10 mg total) by mouth 3 (  three) times daily before meals. 90 tablet 1 05/21/2015 at Unknown time  . ondansetron (ZOFRAN) 4 MG tablet Take 4 mg by mouth every 8 (eight) hours as needed for nausea or vomiting.   05/21/2015 at Unknown time  . ondansetron (ZOFRAN ODT) 4 MG disintegrating tablet Take 1 tablet (4 mg total) by mouth every 8 (eight) hours as needed for nausea or vomiting. (Patient not taking: Reported on 05/21/2015) 20 tablet 0   . Prenatal Vit-Fe Fumarate-FA (PRENATAL MULTIVITAMIN) TABS tablet Take 1 tablet by mouth daily at 12 noon.   05/18/2015  . ranitidine (ZANTAC) 150 MG tablet Take 1 tablet (150 mg total) by mouth 2 (two) times daily. (Patient not taking: Reported on 05/21/2015) 60 tablet 0     Review of Systems  Constitutional: Positive for weight loss  and malaise/fatigue. Negative for fever and chills.  Gastrointestinal: Positive for heartburn, nausea, vomiting and constipation. Negative for abdominal pain and diarrhea.  Genitourinary: Negative for dysuria.  Neurological: Negative for headaches.   Physical Exam   Blood pressure 102/74, pulse 102, temperature 98.4 F (36.9 C), temperature source Oral, resp. rate 18, weight 136 lb (61.689 kg), last menstrual period 03/20/2015, unknown if currently breastfeeding.  Physical Exam  Nursing note and vitals reviewed. Constitutional: She is oriented to person, place, and time. She appears well-developed and well-nourished. No distress.  HENT:  Head: Normocephalic.  Eyes: Pupils are equal, round, and reactive to light.  Neck: Normal range of motion. Neck supple.  Cardiovascular: Normal rate.   Respiratory: Effort normal.  GI: Soft.  Musculoskeletal: Normal range of motion.  Neurological: She is alert and oriented to person, place, and time.  Skin: Skin is warm and dry.  Psychiatric: She has a normal mood and affect.    MAU Course  Procedures Results for orders placed or performed during the hospital encounter of 05/21/15 (from the past 24 hour(s))  Urinalysis, Routine w reflex microscopic (not at [facility])     Status: Abnormal   Collection Time: 05/21/15  1:45 PM  Result Value Ref Range   Color, Urine YELLOW YELLOW   APPearance HAZY (A) CLEAR   Specific Gravity, Urine 1.025 1.005 - 1.030   pH 6.5 5.0 - 8.0   Glucose, UA NEGATIVE NEGATIVE mg/dL   Hgb urine dipstick NEGATIVE NEGATIVE   Bilirubin Urine NEGATIVE NEGATIVE   Ketones, ur 40 (A) NEGATIVE mg/dL   Protein, ur NEGATIVE NEGATIVE mg/dL   Nitrite NEGATIVE NEGATIVE   Leukocytes, UA NEGATIVE NEGATIVE  IV #1D5LR with phenergan 25mg  IV IV#2 LR with MVT Discussed with Dr. Arelia SneddonMcComb Can give Robinul for ptylism Robinul  Pt still feeling quesy, not actively vomiting-,Pepcid and Reglan given IV  Pt feeling better- able to tolerate some  PO fluids Pt has not vomited while she has been in MAU Care turned over to Venia CarbonJennifer Rasch, NP Assessment and Plan  1st trimester pregnancy with Hyperemesis gravidarum- pt has prescriptions for zofran, phenergan and reglan ptylism- robinul Rx Constipation- recommended miralax in addition to colace 1st trimester pregnancy  Trust Leh 05/21/2015, 3:04 PM

## 2015-05-21 NOTE — MAU Note (Signed)
Has hyperemesis. Was here yesterday- got IV fluids and IV medication.  Went home, ate as instructed, has thrown everything she has eaten up. Called office, instructed to come back in for fluids.

## 2015-05-21 NOTE — Discharge Instructions (Signed)

## 2015-06-20 NOTE — L&D Delivery Note (Signed)
SVD of VFI at 2249 on 12/28/15.  EBL 250cc.  Placenta to pathology (chorio). Head delivered ROA and body followed atraumatically.  Baby to abdomen.  Cord was clamped and cut.  Cord blood was collected for donation.  Placenta delivered S/I/3VC.  Fundus was firmed with pitocin and massage.  Bilateral periurethral lacs were repaired with 3-0 rapide in the normal fashion.  Mom and baby stable.   Mitchel HonourMegan Cortez Flippen, DO

## 2015-07-03 ENCOUNTER — Inpatient Hospital Stay (EMERGENCY_DEPARTMENT_HOSPITAL)
Admission: AD | Admit: 2015-07-03 | Discharge: 2015-07-04 | Disposition: A | Discharge status: Home / Self Care | Payer: 59 | Source: Ambulatory Visit | Attending: Obstetrics and Gynecology | Admitting: Obstetrics and Gynecology

## 2015-07-03 ENCOUNTER — Inpatient Hospital Stay (HOSPITAL_COMMUNITY): Payer: 59

## 2015-07-03 ENCOUNTER — Encounter (HOSPITAL_COMMUNITY): Payer: Self-pay | Admitting: *Deleted

## 2015-07-03 DIAGNOSIS — Z880 Allergy status to penicillin: Secondary | ICD-10-CM | POA: Insufficient documentation

## 2015-07-03 DIAGNOSIS — R3129 Other microscopic hematuria: Secondary | ICD-10-CM | POA: Insufficient documentation

## 2015-07-03 DIAGNOSIS — O9989 Other specified diseases and conditions complicating pregnancy, childbirth and the puerperium: Secondary | ICD-10-CM

## 2015-07-03 DIAGNOSIS — K5900 Constipation, unspecified: Secondary | ICD-10-CM | POA: Diagnosis not present

## 2015-07-03 DIAGNOSIS — Z3A15 15 weeks gestation of pregnancy: Secondary | ICD-10-CM

## 2015-07-03 DIAGNOSIS — O26899 Other specified pregnancy related conditions, unspecified trimester: Secondary | ICD-10-CM

## 2015-07-03 DIAGNOSIS — R109 Unspecified abdominal pain: Secondary | ICD-10-CM | POA: Diagnosis not present

## 2015-07-03 DIAGNOSIS — O99612 Diseases of the digestive system complicating pregnancy, second trimester: Secondary | ICD-10-CM | POA: Diagnosis not present

## 2015-07-03 DIAGNOSIS — O26892 Other specified pregnancy related conditions, second trimester: Secondary | ICD-10-CM

## 2015-07-03 DIAGNOSIS — R1032 Left lower quadrant pain: Secondary | ICD-10-CM | POA: Diagnosis not present

## 2015-07-03 LAB — URINE MICROSCOPIC-ADD ON

## 2015-07-03 LAB — CBC
HEMATOCRIT: 29.8 % — AB (ref 36.0–46.0)
HEMOGLOBIN: 10.7 g/dL — AB (ref 12.0–15.0)
MCH: 31.1 pg (ref 26.0–34.0)
MCHC: 35.9 g/dL (ref 30.0–36.0)
MCV: 86.6 fL (ref 78.0–100.0)
Platelets: 236 10*3/uL (ref 150–400)
RBC: 3.44 MIL/uL — ABNORMAL LOW (ref 3.87–5.11)
RDW: 14.2 % (ref 11.5–15.5)
WBC: 8.2 10*3/uL (ref 4.0–10.5)

## 2015-07-03 LAB — COMPREHENSIVE METABOLIC PANEL
ALT: 10 U/L — AB (ref 14–54)
AST: 17 U/L (ref 15–41)
Albumin: 3.7 g/dL (ref 3.5–5.0)
Alkaline Phosphatase: 36 U/L — ABNORMAL LOW (ref 38–126)
Anion gap: 8 (ref 5–15)
BILIRUBIN TOTAL: 0.8 mg/dL (ref 0.3–1.2)
BUN: 8 mg/dL (ref 6–20)
CHLORIDE: 104 mmol/L (ref 101–111)
CO2: 25 mmol/L (ref 22–32)
CREATININE: 0.54 mg/dL (ref 0.44–1.00)
Calcium: 9.2 mg/dL (ref 8.9–10.3)
GFR calc non Af Amer: >60 mL/min (ref >60)
Glucose, Bld: 94 mg/dL (ref 65–99)
Potassium: 3.5 mmol/L (ref 3.5–5.1)
Sodium: 137 mmol/L (ref 135–145)
Total Protein: 5.8 g/dL — ABNORMAL LOW (ref 6.5–8.1)

## 2015-07-03 LAB — URINALYSIS, ROUTINE W REFLEX MICROSCOPIC
Bilirubin Urine: NEGATIVE
GLUCOSE, UA: NEGATIVE mg/dL
Ketones, ur: 15 mg/dL — AB
Leukocytes, UA: NEGATIVE
Nitrite: NEGATIVE
Protein, ur: NEGATIVE mg/dL
Specific Gravity, Urine: >1.03 — ABNORMAL HIGH (ref 1.005–1.030)
pH: 6 (ref 5.0–8.0)

## 2015-07-03 NOTE — Discharge Instructions (Signed)

## 2015-07-03 NOTE — MAU Note (Signed)
Early afternoon started having pain L side and coming in waves. Took warm bath and laid down. Went away enough for me to sleep. Came back tonight worse and not going away. With hx of 2 SABs i am worried

## 2015-07-03 NOTE — MAU Provider Note (Signed)
History     CSN: [id number]  Arrival date and time: 07/03/15 2040   First Provider Initiated Contact with Patient 07/03/15 2205         Chief Complaint  Patient presents with  . Abdominal Pain   Heather [name] is a 30 y.o. G3P0020 at [redacted]w[redacted]d who presents with abdominal pain.   Abdominal Pain This is a new problem. The current episode started today. The onset quality is sudden. The problem occurs intermittently. The problem has been waxing and waning. The pain is located in the left flank. The pain is at a severity of 4/10. The quality of the pain is dull, sharp and colicky. The abdominal pain does not radiate. Associated symptoms include constipation (last BM today, normal for her. ), nausea and vomiting (has hyperemesis, no change in vomiting status). Pertinent negatives include no diarrhea, dysuria, fever, frequency or hematuria. Nothing aggravates the pain. The pain is relieved by certain positions. Treatments tried: Gas-ex & warm bath. The treatment provided no relief.     OB History    Gravida Para Term Preterm AB TAB SAB Ectopic Multiple Living   3 0   2  2   0      Past Medical History  Diagnosis Date  . Chest pain   . Shortness of breath dyspnea     with chest pains  . Anginal pain (HCC)     cardiac workup normal  . Hypothyroidism     borderline  . Anxiety   . Depression   . GERD (gastroesophageal reflux disease)     with pregnancy  . Headache     migraines  . Miscarriage within last 12 months March 2016  . Hashimoto's disease     Past Surgical History  Procedure Laterality Date  . Laparoscopic appendectomy N/A 01/13/2014    Procedure: APPENDECTOMY LAPAROSCOPIC;  Surgeon: Cherylynn Ridges, MD;  Location: [facility] OR;  Service: General;  Laterality: N/A;  . Tooth extraction    . Appendectomy    . Dilation and evacuation N/A 08/27/2014    Procedure: DILATATION AND EVACUATION WITH CHROMOSOMES STUDES, ULTRASOUND;  Surgeon: Harold Hedge, MD;  Location: [facility] ORS;  Service:  Gynecology;  Laterality: N/A;    Family History  Problem Relation Age of Onset  . Hypertension Mother   . Diabetes Mother     Social History  Substance Use Topics  . Smoking status: Never Smoker   . Smokeless tobacco: Never Used  . Alcohol Use: No    Allergies:  Allergies  Allergen Reactions  . Penicillins Other (See Comments)    Reaction:  Unknown Pt states that her entire family has a reaction to this medication.      Prescriptions prior to admission  Medication Sig Dispense Refill Last Dose  . calcium carbonate (TUMS - DOSED IN MG ELEMENTAL CALCIUM) 500 MG chewable tablet Chew 2 tablets by mouth 3 (three) times daily as needed for indigestion or heartburn.   Past Week at Unknown time  . docusate sodium (COLACE) 100 MG capsule Take 100 mg by mouth 2 (two) times daily.   07/03/2015 at Unknown time  . levothyroxine (SYNTHROID, LEVOTHROID) 50 MCG tablet Take 50-75 mcg by mouth daily before breakfast. Pt takes on Tuesday and all other days.   07/03/2015 at Unknown time  . metoCLOPramide (REGLAN) 10 MG tablet Take 1 tablet (10 mg total) by mouth 3 (three) times daily before meals. 90 tablet 1 07/03/2015 at Unknown time  . ondansetron (ZOFRAN ODT)  4 MG disintegrating tablet Take 1 tablet (4 mg total) by mouth every 8 (eight) hours as needed for nausea or vomiting. 20 tablet 0 07/03/2015 at Unknown time  . Prenatal Vit-Fe Fumarate-FA (PRENATAL MULTIVITAMIN) TABS tablet Take 1 tablet by mouth daily.    07/03/2015 at Unknown time  . ranitidine (ZANTAC) 150 MG tablet Take 1 tablet (150 mg total) by mouth 2 (two) times daily. 60 tablet 0 07/03/2015 at Unknown time  . simethicone (MYLICON) 125 MG chewable tablet Chew 125 mg by mouth every 6 (six) hours as needed for flatulence.   07/03/2015 at Unknown time    Review of Systems  Constitutional: Negative.  Negative for fever.  Gastrointestinal: Positive for nausea, vomiting (has hyperemesis, no change in vomiting status), abdominal  pain and constipation (last BM today, normal for her. ). Negative for diarrhea.  Genitourinary: Negative.  Negative for dysuria, frequency and hematuria.   Physical Exam   Blood pressure 113/73, pulse 87, temperature 98.5 F (36.9 C), resp. rate 18, height 5\' 9"  (1.753 m), weight 136 lb (61.689 kg), last menstrual period 03/20/2015, unknown if currently breastfeeding.  Physical Exam  Nursing note and vitals reviewed. Constitutional: She is oriented to person, place, and time. She appears well-developed and well-nourished. No distress.  HENT:  Head: Normocephalic and atraumatic.  Eyes: Conjunctivae are normal. Right eye exhibits no discharge. Left eye exhibits no discharge. No scleral icterus.  Neck: Normal range of motion.  Cardiovascular: Normal rate, regular rhythm and normal heart sounds.   No murmur heard. Respiratory: Effort normal and breath sounds normal. No respiratory distress. She has no wheezes.  GI: Soft. Bowel sounds are normal. She exhibits no distension. There is no tenderness. There is CVA tenderness (mild on left side). There is no rebound.  Neurological: She is alert and oriented to person, place, and time.  Skin: Skin is warm and dry. She is not diaphoretic.  Psychiatric: She has a normal mood and affect. Her behavior is normal. Judgment and thought content normal.    MAU Course  Procedures Results for orders placed or performed during the hospital encounter of 07/03/15 (from the past 24 hour(s))  Urinalysis, Routine w reflex microscopic (not at [facility])     Status: Abnormal   Collection Time: 07/03/15  9:00 PM  Result Value Ref Range   Color, Urine YELLOW YELLOW   APPearance CLEAR CLEAR   Specific Gravity, Urine >1.030 (H) 1.005 - 1.030   pH 6.0 5.0 - 8.0   Glucose, UA NEGATIVE NEGATIVE mg/dL   Hgb urine dipstick LARGE (A) NEGATIVE   Bilirubin Urine NEGATIVE NEGATIVE   Ketones, ur 15 (A) NEGATIVE mg/dL   Protein, ur NEGATIVE NEGATIVE mg/dL   Nitrite NEGATIVE  NEGATIVE   Leukocytes, UA NEGATIVE NEGATIVE  Urine microscopic-add on     Status: Abnormal   Collection Time: 07/03/15  9:00 PM  Result Value Ref Range   Squamous Epithelial / LPF 0-5 (A) NONE SEEN   WBC, UA 0-5 0 - 5 WBC/hpf   RBC / HPF 6-30 0 - 5 RBC/hpf   Bacteria, UA FEW (A) NONE SEEN   Crystals CA OXALATE CRYSTALS (A) NEGATIVE   Urine-Other MUCOUS PRESENT   CBC     Status: Abnormal   Collection Time: 07/03/15 10:20 PM  Result Value Ref Range   WBC 8.2 4.0 - 10.5 K/uL   RBC 3.44 (L) 3.87 - 5.11 MIL/uL   Hemoglobin 10.7 (L) 12.0 - 15.0 g/dL   HCT 78.229.8 (L) 95.636.0 -  46.0 %   MCV 86.6 78.0 - 100.0 fL   MCH 31.1 26.0 - 34.0 pg   MCHC 35.9 30.0 - 36.0 g/dL   RDW 45.4 09.8 - 11.9 %   Platelets 236 150 - 400 K/uL  Comprehensive metabolic panel     Status: Abnormal   Collection Time: 07/03/15 10:20 PM  Result Value Ref Range   Sodium 137 135 - 145 mmol/L   Potassium 3.5 3.5 - 5.1 mmol/L   Chloride 104 101 - 111 mmol/L   CO2 25 22 - 32 mmol/L   Glucose, Bld 94 65 - 99 mg/dL   BUN 8 6 - 20 mg/dL   Creatinine, Ser 1.47 0.44 - 1.00 mg/dL   Calcium 9.2 8.9 - 82.9 mg/dL   Total Protein 5.8 (L) 6.5 - 8.1 g/dL   Albumin 3.7 3.5 - 5.0 g/dL   AST 17 15 - 41 U/L   ALT 10 (L) 14 - 54 U/L   Alkaline Phosphatase 36 (L) 38 - 126 U/L   Total Bilirubin 0.8 0.3 - 1.2 mg/dL   GFR calc non Af Amer >60 >60 mL/min   GFR calc Af Amer >60 >60 mL/min   Anion gap 8 5 - 15   US Renal  07/03/2015  CLINICAL DATA:  Acute onset of left flank pain and microscopic hematuria. Initial encounter. EXAM: RENAL / URINARY TRACT ULTRASOUND COMPLETE COMPARISON:  CT of the abdomen and pelvis from 01/12/2014 FINDINGS: Right Kidney: Length: 10.6 cm. Echogenicity within normal limits. A 1.9 cm simple cyst is again noted at the lower pole of the right kidney. No hydronephrosis visualized. Left Kidney: Length: 12.0 cm. Echogenicity within normal limits. Slight fullness of the left upper pole calyx remains within normal  limits. No mass or hydronephrosis visualized. Bladder: Appears normal for degree of bladder distention. IMPRESSION: Unremarkable renal ultrasound. No evidence of hydronephrosis. Small right renal cyst again noted. Electronically Signed   By: Roanna Raider M.D.   On: 07/03/2015 22:55    MDM FHT 140 by doppler Cervix closed Renal ultrasound ordered d/t microscopic hematuria & mild CVA tenderness. Pt declines pain medication.  2341- S/w Dr. Rana Snare. Discussed pt presentation & results. Ok to discharge home.   Assessment and Plan  A: 1. Abdominal pain affecting pregnancy   2. Microscopic hematuria     P: Discharge home Tylenol prn pain Discussed reasons to return OB urine culture pending Keep f/u with ob  Judeth Horn, NP  07/03/2015, 10:05 PM

## 2015-07-04 ENCOUNTER — Inpatient Hospital Stay (HOSPITAL_COMMUNITY): Payer: 59

## 2015-07-04 ENCOUNTER — Encounter (HOSPITAL_COMMUNITY): Payer: Self-pay | Admitting: *Deleted

## 2015-07-04 ENCOUNTER — Inpatient Hospital Stay (HOSPITAL_COMMUNITY)
Admission: AD | Admit: 2015-07-04 | Discharge: 2015-07-04 | Disposition: A | Discharge status: Home / Self Care | Payer: 59 | Source: Ambulatory Visit | Attending: Obstetrics and Gynecology | Admitting: Obstetrics and Gynecology

## 2015-07-04 DIAGNOSIS — R1032 Left lower quadrant pain: Secondary | ICD-10-CM | POA: Insufficient documentation

## 2015-07-04 DIAGNOSIS — K5900 Constipation, unspecified: Secondary | ICD-10-CM | POA: Insufficient documentation

## 2015-07-04 DIAGNOSIS — Z8742 Personal history of other diseases of the female genital tract: Secondary | ICD-10-CM

## 2015-07-04 DIAGNOSIS — O99612 Diseases of the digestive system complicating pregnancy, second trimester: Secondary | ICD-10-CM

## 2015-07-04 DIAGNOSIS — R109 Unspecified abdominal pain: Secondary | ICD-10-CM

## 2015-07-04 DIAGNOSIS — O26892 Other specified pregnancy related conditions, second trimester: Secondary | ICD-10-CM | POA: Insufficient documentation

## 2015-07-04 DIAGNOSIS — O26899 Other specified pregnancy related conditions, unspecified trimester: Secondary | ICD-10-CM

## 2015-07-04 DIAGNOSIS — Z3A15 15 weeks gestation of pregnancy: Secondary | ICD-10-CM | POA: Insufficient documentation

## 2015-07-04 LAB — CBC
HEMATOCRIT: 34.4 % — AB (ref 36.0–46.0)
Hemoglobin: 12.2 g/dL (ref 12.0–15.0)
MCH: 30.7 pg (ref 26.0–34.0)
MCHC: 35.5 g/dL (ref 30.0–36.0)
MCV: 86.4 fL (ref 78.0–100.0)
PLATELETS: 220 10*3/uL (ref 150–400)
RBC: 3.98 MIL/uL (ref 3.87–5.11)
RDW: 14.2 % (ref 11.5–15.5)
WBC: 8.2 10*3/uL (ref 4.0–10.5)

## 2015-07-04 LAB — COMPREHENSIVE METABOLIC PANEL
ALBUMIN: 4 g/dL (ref 3.5–5.0)
ALT: 12 U/L — ABNORMAL LOW (ref 14–54)
ANION GAP: 12 (ref 5–15)
AST: 26 U/L (ref 15–41)
Alkaline Phosphatase: 40 U/L (ref 38–126)
BILIRUBIN TOTAL: 1.3 mg/dL — AB (ref 0.3–1.2)
BUN: 7 mg/dL (ref 6–20)
CHLORIDE: 106 mmol/L (ref 101–111)
CO2: 19 mmol/L — ABNORMAL LOW (ref 22–32)
Calcium: 10.3 mg/dL (ref 8.9–10.3)
Creatinine, Ser: 0.54 mg/dL (ref 0.44–1.00)
GFR calc Af Amer: >60 mL/min (ref >60)
GLUCOSE: 109 mg/dL — AB (ref 65–99)
POTASSIUM: 3.7 mmol/L (ref 3.5–5.1)
Sodium: 137 mmol/L (ref 135–145)
TOTAL PROTEIN: 6.5 g/dL (ref 6.5–8.1)

## 2015-07-04 LAB — URINALYSIS, ROUTINE W REFLEX MICROSCOPIC
BILIRUBIN URINE: NEGATIVE
GLUCOSE, UA: NEGATIVE mg/dL
Ketones, ur: NEGATIVE mg/dL
Leukocytes, UA: NEGATIVE
Nitrite: NEGATIVE
PROTEIN: 30 mg/dL — AB
Specific Gravity, Urine: >1.03 — ABNORMAL HIGH (ref 1.005–1.030)
pH: 6 (ref 5.0–8.0)

## 2015-07-04 LAB — URINE MICROSCOPIC-ADD ON

## 2015-07-04 MED ORDER — LACTATED RINGERS IV BOLUS (SEPSIS)
1000.0000 mL | Freq: Once | INTRAVENOUS | Status: AC
  Administered 2015-07-04: 1000 mL via INTRAVENOUS

## 2015-07-04 MED ORDER — PROMETHAZINE HCL 25 MG PO TABS: 12.5000 mg | ORAL_TABLET | Freq: Four times a day (QID) | ORAL | Status: DC | PRN

## 2015-07-04 MED ORDER — HYDROMORPHONE HCL 1 MG/ML IJ SOLN
1.0000 mg | Freq: Once | INTRAMUSCULAR | Status: AC
  Administered 2015-07-04: 1 mg via INTRAVENOUS
  Filled 2015-07-04: qty 1

## 2015-07-04 MED ORDER — POLYETHYLENE GLYCOL 3350 17 G PO PACK
17.0000 g | PACK | Freq: Every day | ORAL | Status: DC
  Administered 2015-07-04: 17 g via ORAL
  Filled 2015-07-04 (×2): qty 1

## 2015-07-04 MED ORDER — MILK AND MOLASSES ENEMA
1.0000 | Freq: Once | RECTAL | Status: AC
  Administered 2015-07-04: 250 mL via RECTAL

## 2015-07-04 MED ORDER — PROMETHAZINE HCL 25 MG/ML IJ SOLN
25.0000 mg | Freq: Once | INTRAMUSCULAR | Status: AC
  Administered 2015-07-04: 25 mg via INTRAVENOUS
  Filled 2015-07-04: qty 1

## 2015-07-04 NOTE — MAU Note (Signed)
yestarday morning around 10 am started with left sided pain.  N/v.  Today pain has gotten worse. No working currently as a result of hyperemesis.  Vomiting has gotten better on zofran Q6 4 mg.   Continued zofran without relief.  Pain comes and goes in increases intensity.   Was offered pain relief yestarday but not as painful

## 2015-07-04 NOTE — Progress Notes (Signed)
Bedside u/s in progress.  Pt states pain has returned

## 2015-07-04 NOTE — Discharge Instructions (Signed)
Constipation, Adult Constipation is when a person has fewer than three bowel movements a week, has difficulty having a bowel movement, or has stools that are dry, hard, or larger than normal. As people grow older, constipation is more common. A low-fiber diet, not taking in enough fluids, and taking certain medicines may make constipation worse.  CAUSES   Certain medicines, such as antidepressants, pain medicine, iron supplements, antacids, and water pills.   Certain diseases, such as diabetes, irritable bowel syndrome (IBS), thyroid disease, or depression.   Not drinking enough water.   Not eating enough fiber-rich foods.   Stress or travel.   Lack of physical activity or exercise.   Ignoring the urge to have a bowel movement.   Using laxatives too much.  SIGNS AND SYMPTOMS   Having fewer than three bowel movements a week.   Straining to have a bowel movement.   Having stools that are hard, dry, or larger than normal.   Feeling full or bloated.   Pain in the lower abdomen.   Not feeling relief after having a bowel movement.  DIAGNOSIS  Your health care provider will take a medical history and perform a physical exam. Further testing may be done for severe constipation. Some tests may include:  A barium enema X-ray to examine your rectum, colon, and, sometimes, your small intestine.   A sigmoidoscopy to examine your lower colon.   A colonoscopy to examine your entire colon. TREATMENT  Treatment will depend on the severity of your constipation and what is causing it. Some dietary treatments include drinking more fluids and eating more fiber-rich foods. Lifestyle treatments may include regular exercise. If these diet and lifestyle recommendations do not help, your health care provider may recommend taking over-the-counter laxative medicines to help you have bowel movements. Prescription medicines may be prescribed if over-the-counter medicines do not work.    HOME CARE INSTRUCTIONS   Eat foods that have a lot of fiber, such as fruits, vegetables, whole grains, and beans.  Limit foods high in fat and processed sugars, such as french fries, hamburgers, cookies, candies, and soda.   A fiber supplement may be added to your diet if you cannot get enough fiber from foods.   Drink enough fluids to keep your urine clear or pale yellow.   Exercise regularly or as directed by your health care provider.   Go to the restroom when you have the urge to go. Do not hold it.   Only take over-the-counter or prescription medicines as directed by your health care provider. Do not take other medicines for constipation without talking to your health care provider first.  [place] IF:   You have bright red blood in your stool.   Your constipation lasts for more than 4 days or gets worse.   You have abdominal or rectal pain.   You have thin, pencil-like stools.   You have unexplained weight loss. MAKE SURE YOU:   Understand these instructions.  Will watch your condition.  Will get help right away if you are not doing well or get worse.   This information is not intended to replace advice given to you by your health care provider. Make sure you discuss any questions you have with your health care provider.   Document Released: 03/03/2004 Document Revised: 06/26/2014 Document Reviewed: 03/17/2013 Elsevier Interactive Patient Education 2016 Elsevier Inc.  High-Fiber Diet Fiber, also called dietary fiber, is a type of carbohydrate found in fruits, vegetables, whole grains, and  beans. A high-fiber diet can have many health benefits. Your health care provider may recommend a high-fiber diet to help: °· Prevent constipation. Fiber can make your bowel movements more regular. °· Lower your cholesterol. °· Relieve hemorrhoids, uncomplicated diverticulosis, or irritable bowel syndrome. °· Prevent overeating as part of a weight-loss  plan. °· Prevent heart disease, type 2 diabetes, and certain cancers. °WHAT IS MY PLAN? °The recommended daily intake of fiber includes: °· 38 grams for men under age 50. °· 30 grams for men over age 50. °· 25 grams for women under age 50. °· 21 grams for women over age 50. °You can get the recommended daily intake of dietary fiber by eating a variety of fruits, vegetables, grains, and beans. Your health care provider may also recommend a fiber supplement if it is not possible to get enough fiber through your diet. °WHAT DO I NEED TO KNOW ABOUT A HIGH-FIBER DIET? °· Fiber supplements have not been widely studied for their effectiveness, so it is better to get fiber through food sources. °· Always check the fiber content on the nutrition facts label of any prepackaged food. Look for foods that contain at least 5 grams of fiber per serving. °· Ask your dietitian if you have questions about specific foods that are related to your condition, especially if those foods are not listed in the following section. °· Increase your daily fiber consumption gradually. Increasing your intake of dietary fiber too quickly may cause bloating, cramping, or gas. °· Drink plenty of water. Water helps you to digest fiber. °WHAT FOODS CAN I EAT? °Grains °Whole-grain breads. Multigrain cereal. Oats and oatmeal. Brown rice. Barley. Bulgur wheat. Millet. Bran muffins. Popcorn. Rye wafer crackers. °Vegetables °Sweet potatoes. Spinach. Kale. Artichokes. Cabbage. Broccoli. Green peas. Carrots. Squash. °Fruits °Berries. Pears. Apples. Oranges. Avocados. Prunes and raisins. Dried figs. °Meats and Other Protein Sources °Navy, kidney, pinto, and soy beans. Split peas. Lentils. Nuts and seeds. °Dairy °Fiber-fortified yogurt. °Beverages °Fiber-fortified soy milk. Fiber-fortified orange juice. °Other °Fiber bars. °The items listed above may not be a complete list of recommended foods or beverages. Contact your dietitian for more options. °WHAT FOODS  ARE NOT RECOMMENDED? °Grains °White bread. Pasta made with refined flour. White rice. °Vegetables °Fried potatoes. Canned vegetables. Well-cooked vegetables.  °Fruits °Fruit juice. Cooked, strained fruit. °Meats and Other Protein Sources °Fatty cuts of meat. Fried poultry or fried fish. °Dairy °Milk. Yogurt. Cream cheese. Sour cream. °Beverages °Soft drinks. °Other °Cakes and pastries. Butter and oils. °The items listed above may not be a complete list of foods and beverages to avoid. Contact your dietitian for more information. °WHAT ARE SOME TIPS FOR INCLUDING HIGH-FIBER FOODS IN MY DIET? °· Eat a wide variety of high-fiber foods. °· Make sure that half of all grains consumed each day are whole grains. °· Replace breads and cereals made from refined flour or white flour with whole-grain breads and cereals. °· Replace white rice with brown rice, bulgur wheat, or millet. °· Start the day with a breakfast that is high in fiber, such as a cereal that contains at least 5 grams of fiber per serving. °· Use beans in place of meat in soups, salads, or pasta. °· Eat high-fiber snacks, such as berries, raw vegetables, nuts, or popcorn. °  °This information is not intended to replace advice given to you by your health care provider. Make sure you discuss any questions you have with your health care provider. °  °Document Released: 06/05/2005 Document Revised: 06/26/2014 Document   Reviewed: 11/18/2013 °Elsevier Interactive Patient Education ©2016 Elsevier Inc. ° °Abdominal Pain During Pregnancy °Abdominal pain is common in pregnancy. Most of the time, it does not cause harm. There are many causes of abdominal pain. Some causes are more serious than others. Some of the causes of abdominal pain in pregnancy are easily diagnosed. Occasionally, the diagnosis takes time to understand. Other times, the cause is not determined. Abdominal pain can be a sign that something is very wrong with the pregnancy, or the pain may have nothing  to do with the pregnancy at all. For this reason, always tell your health care provider if you have any abdominal discomfort. °HOME CARE INSTRUCTIONS  °Monitor your abdominal pain for any changes. The following actions may help to alleviate any discomfort you are experiencing: °· Do not have sexual intercourse or put anything in your vagina until your symptoms go away completely. °· Get plenty of rest until your pain improves. °· Drink clear fluids if you feel nauseous. Avoid solid food as long as you are uncomfortable or nauseous. °· Only take over-the-counter or prescription medicine as directed by your health care provider. °· Keep all follow-up appointments with your health care provider. °SEEK IMMEDIATE MEDICAL CARE IF: °· You are bleeding, leaking fluid, or passing tissue from the vagina. °· You have increasing pain or cramping. °· You have persistent vomiting. °· You have painful or bloody urination. °· You have a fever. °· You notice a decrease in your baby's movements. °· You have extreme weakness or feel faint. °· You have shortness of breath, with or without abdominal pain. °· You develop a severe headache with abdominal pain. °· You have abnormal vaginal discharge with abdominal pain. °· You have persistent diarrhea. °· You have abdominal pain that continues even after rest, or gets worse. °MAKE SURE YOU:  °· Understand these instructions. °· Will watch your condition. °· Will get help right away if you are not doing well or get worse. °  °This information is not intended to replace advice given to you by your health care provider. Make sure you discuss any questions you have with your health care provider. °  °Document Released: 06/05/2005 Document Revised: 03/26/2013 Document Reviewed: 01/02/2013 °Elsevier Interactive Patient Education ©2016 Elsevier Inc. ° °

## 2015-07-04 NOTE — MAU Note (Signed)
Called US requested bedside will come to MAU in 15 minutes.

## 2015-07-04 NOTE — MAU Provider Note (Signed)
Chief Complaint: Abdominal Cramping   None     SUBJECTIVE HPI: Heather [name] is a 30 y.o. G3P0020 at [redacted]w[redacted]d by LMP who presents to maternity admissions reporting severe LLQ pain associated with n/v.  She was seen in MAU yesterday for similar pain but reports it is worsening today. She is crying and moaning in pain today. Renal US yesterday was normal, pelvic exam wnl, and FHT present.  She reports hyperemesis in this pregnancy and is taking Zofran 4 mg PO Q 7-8 hours.  This usually helps her nausea but is not helping today.  Rest, heat, warm bath, Tylenol are not helping her pain at all.  She reports constipation and is having bowel movements daily but they are hard and uncomfortable.  She reports sensation "that my cervix is tingling".  She has hx of miscarriage x 2 and is concerned about this.  She denies vaginal bleeding, vaginal itching/burning, urinary symptoms, h/a, dizziness, n/v, or fever/chills.     HPI  Past Medical History  Diagnosis Date  . Chest pain   . Shortness of breath dyspnea     with chest pains  . Anginal pain (HCC)     cardiac workup normal  . Hypothyroidism     borderline  . Anxiety   . Depression   . GERD (gastroesophageal reflux disease)     with pregnancy  . Headache     migraines  . Miscarriage within last 12 months March 2016  . Hashimoto's disease    Past Surgical History  Procedure Laterality Date  . Laparoscopic appendectomy N/A 01/13/2014    Procedure: APPENDECTOMY LAPAROSCOPIC;  Surgeon: Cherylynn Ridges, MD;  Location: [facility] OR;  Service: General;  Laterality: N/A;  . Tooth extraction    . Appendectomy    . Dilation and evacuation N/A 08/27/2014    Procedure: DILATATION AND EVACUATION WITH CHROMOSOMES STUDES, ULTRASOUND;  Surgeon: Harold Hedge, MD;  Location: [facility] ORS;  Service: Gynecology;  Laterality: N/A;   Social History   Social History  . Marital Status: Married    Spouse Name: N/A  . Number of Children: N/A  . Years of Education: N/A    Occupational History  . Not on file.   Social History Main Topics  . Smoking status: Never Smoker   . Smokeless tobacco: Never Used  . Alcohol Use: No  . Drug Use: No  . Sexual Activity: Yes   Other Topics Concern  . Not on file   Social History Narrative   No current facility-administered medications on file prior to encounter.   Current Outpatient Prescriptions on File Prior to Encounter  Medication Sig Dispense Refill  . calcium carbonate (TUMS - DOSED IN MG ELEMENTAL CALCIUM) 500 MG chewable tablet Chew 2 tablets by mouth 3 (three) times daily as needed for indigestion or heartburn.    . levothyroxine (SYNTHROID, LEVOTHROID) 50 MCG tablet Take 50-75 mcg by mouth daily before breakfast. Pt takes on Tuesday and all other days.    . metoCLOPramide (REGLAN) 10 MG tablet Take 1 tablet (10 mg total) by mouth 3 (three) times daily before meals. 90 tablet 1  . Prenatal Vit-Fe Fumarate-FA (PRENATAL MULTIVITAMIN) TABS tablet Take 1 tablet by mouth daily.     . ranitidine (ZANTAC) 150 MG tablet Take 1 tablet (150 mg total) by mouth 2 (two) times daily. 60 tablet 0   Allergies  Allergen Reactions  . Penicillins Other (See Comments)    Reaction:  Unknown Pt states that her entire  family has a reaction to this medication.      ROS:  Review of Systems  Constitutional: Negative for fever, chills and fatigue.  Respiratory: Negative for shortness of breath.   Cardiovascular: Negative for chest pain.  Gastrointestinal: Positive for abdominal pain and constipation.  Genitourinary: Positive for pelvic pain. Negative for dysuria, flank pain, vaginal bleeding, vaginal discharge, difficulty urinating and vaginal pain.  Neurological: Negative for dizziness and headaches.  Psychiatric/Behavioral: Negative.      I have reviewed patient's Past Medical Hx, Surgical Hx, Family Hx, Social Hx, medications and allergies.   Physical Exam   Patient Vitals for the past 24 hrs:  BP  Pulse Resp  07/04/15 1640 112/69 mmHg 81 18   Constitutional: Well-developed, well-nourished female in moderate distress.  Cardiovascular: normal rate Respiratory: normal effort GI: Abd soft, tender in mid/upper left quadrant, no tenderness in R or L LQ or adnexal regions, Pos BS x 4 MS: Extremities nontender, no edema, normal ROM Neurologic: Alert and oriented x 4.  GU: Neg CVAT.   LAB RESULTS No results found for this or any previous visit (from the past 24 hour(s)).     IMAGING Limited OB US wnl Verbal report from sonographer of significant stool, blocking visualization of ovaries.  Pt reported pain with visible waves of peristalsis of bowel.  [place] Renal  07/03/2015  CLINICAL DATA:  Acute onset of left flank pain and microscopic hematuria. Initial encounter. EXAM: RENAL / URINARY TRACT ULTRASOUND COMPLETE COMPARISON:  CT of the abdomen and pelvis from 01/12/2014 FINDINGS: Right Kidney: Length: 10.6 cm. Echogenicity within normal limits. A 1.9 cm simple cyst is again noted at the lower pole of the right kidney. No hydronephrosis visualized. Left Kidney: Length: 12.0 cm. Echogenicity within normal limits. Slight fullness of the left upper pole calyx remains within normal limits. No mass or hydronephrosis visualized. Bladder: Appears normal for degree of bladder distention. IMPRESSION: Unremarkable renal ultrasound. No evidence of hydronephrosis. Small right renal cyst again noted. Electronically Signed   By: Roanna RaiderJeffery  Chang M.D.   On: 07/03/2015 22:55   [place] Abdomen Limited  07/04/2015  CLINICAL DATA:  Intermittent LEFT flank and LEFT abdomen/lower quadrant pain quadrant pain, pregnant at [redacted] weeks, decreased bowel movements recently EXAM: LIMITED ABDOMINAL ULTRASOUND COMPARISON:  None FINDINGS: Sonography of the lateral mid and lower abdomen were performed. Imaging of the gravid uterus performed and interpreted separately. No free fluid, mass or focal sonographic abnormality identified in the  lateral LEFT abdomen. IMPRESSION: Negative limited abdominal ultrasound at site of symptoms in the lateral LEFT abdomen. Electronically Signed   By: Ulyses SouthwardMark  Boles M.D.   On: 07/04/2015 11:00   [place] Mfm Ob Limited  07/05/2015  OBSTETRICAL ULTRASOUND: This exam was performed within a [facility]. The OB US report was generated in the [facility] system, and faxed to the ordering physician.  This report is available in the [facility] [employer] PACS. See the [facility] Obstetric US report via the Image Link.   MAU Management/MDM: Ordered labs and imaging and reviewed results.  Dilaudid 1 mg IV and LR x 1000 ml given in MAU. Pt pain resolved but returned in 1-2 hours so Dilaudid 1 mg second dose given.  LR x 1000 mg second bag given.  Consult Dr Rana SnareLowe.  Soap suds enema/disempaction in MAU.  Pt unable to have BM after enema and only small amount of liquid returned from 1000 ml of enema.  Pt given Phenergan 25 mg IV for nausea and rested in MAU  x2-3 hours and was still unable to have BM.  Pt reported significant decrease in abdominal pain, from 10/10 to 3-4/10 on pain scale and reduced pain was remote from pain medication administration, more related to changes in bowel. Also, pt performed some bowel disempaction herself while in the bathroom and caused a small amount of bright red bleeding.  She expressed anxiety about this and declined disempaction by RN or CNM.   Offered pt discharge from MAU to continue regimen at home of Miralax and Colace.  Pt expressed anxiety that pain will return and prefers to have another treatment in MAU.  Miralax 17g packet given in small amount of liquid and Molasses and milk enema performed.  Pt did have significant volume of BM and reported pain still low, 3/10.  D/C home to continue Miralax daily and Colace twice daily.  Reviewed high fiber diet.  Pt stable at time of discharge.  ASSESSMENT 1. Constipation during pregnancy in second trimester   2. LLQ pain   3. Hx of ovarian cyst   4.  Abdominal pain affecting pregnancy, antepartum   5. Abdominal pain, LLQ (left lower quadrant)     PLAN Discharge home Education provided about high fiber diet Continue Miralax and Colace as prescribed Fleets enema PRN Do not use Zofran, instead use Phenergan 12.5-25 mg PO Q 6 hours and/or Reglan 10 mg TID PRN instead and Rx provided. F/U in office as scheduled Return to MAU as needed    Medication List    STOP taking these medications        ondansetron 4 MG disintegrating tablet  Commonly known as:  ZOFRAN ODT      TAKE these medications        calcium carbonate 500 MG chewable tablet  Commonly known as:  TUMS - dosed in mg elemental calcium  Chew 2 tablets by mouth 3 (three) times daily as needed for indigestion or heartburn.     levothyroxine 50 MCG tablet  Commonly known as:  SYNTHROID, LEVOTHROID  Take 50-75 mcg by mouth daily before breakfast. Pt takes on Tuesday and all other days.     metoCLOPramide 10 MG tablet  Commonly known as:  REGLAN  Take 1 tablet (10 mg total) by mouth 3 (three) times daily before meals.     prenatal multivitamin Tabs tablet  Take 1 tablet by mouth daily.     promethazine 25 MG tablet  Commonly known as:  PHENERGAN  Take 0.5-1 tablets (12.5-25 mg total) by mouth every 6 (six) hours as needed for nausea.     ranitidine 150 MG tablet  Commonly known as:  ZANTAC  Take 1 tablet (150 mg total) by mouth 2 (two) times daily.       Follow-up Information    Follow up with LOWE,DAVID C, MD.   Specialty:  Obstetrics and Gynecology   Why:  As scheduled, Return to MAU as needed for emergencies   Contact information:   [address] [place] [address] [phone]       Sharen Counter Certified Nurse-Midwife 07/05/2015  4:30 PM

## 2015-07-05 LAB — CULTURE, OB URINE

## 2015-07-06 ENCOUNTER — Encounter (HOSPITAL_COMMUNITY): Payer: Self-pay | Admitting: *Deleted

## 2015-07-06 ENCOUNTER — Inpatient Hospital Stay (HOSPITAL_COMMUNITY): Payer: 59

## 2015-07-06 ENCOUNTER — Observation Stay (HOSPITAL_COMMUNITY)
Admission: AD | Admit: 2015-07-06 | Discharge: 2015-07-07 | Disposition: A | Discharge status: Home / Self Care | Payer: 59 | Source: Ambulatory Visit | Attending: Obstetrics and Gynecology | Admitting: Obstetrics and Gynecology

## 2015-07-06 DIAGNOSIS — O26832 Pregnancy related renal disease, second trimester: Secondary | ICD-10-CM | POA: Insufficient documentation

## 2015-07-06 DIAGNOSIS — N1330 Unspecified hydronephrosis: Secondary | ICD-10-CM | POA: Insufficient documentation

## 2015-07-06 DIAGNOSIS — O9989 Other specified diseases and conditions complicating pregnancy, childbirth and the puerperium: Secondary | ICD-10-CM | POA: Diagnosis present

## 2015-07-06 DIAGNOSIS — Z880 Allergy status to penicillin: Secondary | ICD-10-CM | POA: Diagnosis not present

## 2015-07-06 DIAGNOSIS — R109 Unspecified abdominal pain: Secondary | ICD-10-CM

## 2015-07-06 DIAGNOSIS — Z3A15 15 weeks gestation of pregnancy: Secondary | ICD-10-CM | POA: Diagnosis not present

## 2015-07-06 DIAGNOSIS — Z79899 Other long term (current) drug therapy: Secondary | ICD-10-CM | POA: Insufficient documentation

## 2015-07-06 DIAGNOSIS — K5900 Constipation, unspecified: Secondary | ICD-10-CM | POA: Insufficient documentation

## 2015-07-06 DIAGNOSIS — O210 Mild hyperemesis gravidarum: Secondary | ICD-10-CM | POA: Diagnosis not present

## 2015-07-06 DIAGNOSIS — O99612 Diseases of the digestive system complicating pregnancy, second trimester: Secondary | ICD-10-CM | POA: Diagnosis not present

## 2015-07-06 LAB — URINALYSIS, ROUTINE W REFLEX MICROSCOPIC
Glucose, UA: NEGATIVE mg/dL
Leukocytes, UA: NEGATIVE
NITRITE: NEGATIVE
PROTEIN: NEGATIVE mg/dL
pH: 6 (ref 5.0–8.0)

## 2015-07-06 LAB — URINE MICROSCOPIC-ADD ON

## 2015-07-06 MED ORDER — DOCUSATE SODIUM 100 MG PO CAPS: 100.0000 mg | ORAL_CAPSULE | Freq: Every day | ORAL | Status: DC

## 2015-07-06 MED ORDER — HYDROMORPHONE HCL 1 MG/ML IJ SOLN
1.0000 mg | Freq: Once | INTRAMUSCULAR | Status: AC
  Administered 2015-07-06: 1 mg via INTRAVENOUS
  Filled 2015-07-06: qty 1

## 2015-07-06 MED ORDER — ONDANSETRON HCL 4 MG/2ML IJ SOLN
4.0000 mg | Freq: Once | INTRAMUSCULAR | Status: AC
  Administered 2015-07-06: 4 mg via INTRAVENOUS
  Filled 2015-07-06: qty 2

## 2015-07-06 MED ORDER — M.V.I. ADULT IV INJ
Freq: Once | INTRAVENOUS | Status: AC
  Administered 2015-07-06: 15:00:00 via INTRAVENOUS
  Filled 2015-07-06: qty 10

## 2015-07-06 MED ORDER — HYDROMORPHONE HCL 1 MG/ML IJ SOLN
1.0000 mg | INTRAMUSCULAR | Status: DC | PRN
  Administered 2015-07-06 – 2015-07-07 (×4): 1 mg via INTRAVENOUS
  Filled 2015-07-06 (×4): qty 1

## 2015-07-06 MED ORDER — CALCIUM CARBONATE ANTACID 500 MG PO CHEW: 2.0000 | CHEWABLE_TABLET | ORAL | Status: DC | PRN

## 2015-07-06 MED ORDER — ACETAMINOPHEN 325 MG PO TABS
650.0000 mg | ORAL_TABLET | ORAL | Status: DC | PRN
  Administered 2015-07-06 – 2015-07-07 (×4): 650 mg via ORAL
  Filled 2015-07-06 (×4): qty 2

## 2015-07-06 MED ORDER — CYCLOBENZAPRINE HCL 10 MG PO TABS
10.0000 mg | ORAL_TABLET | Freq: Once | ORAL | Status: AC
  Administered 2015-07-06: 10 mg via ORAL
  Filled 2015-07-06: qty 1

## 2015-07-06 MED ORDER — PRENATAL MULTIVITAMIN CH
1.0000 | ORAL_TABLET | Freq: Every day | ORAL | Status: DC
  Administered 2015-07-07: 1 via ORAL
  Filled 2015-07-06: qty 1

## 2015-07-06 MED ORDER — ZOLPIDEM TARTRATE 5 MG PO TABS: 5.0000 mg | ORAL_TABLET | Freq: Every evening | ORAL | Status: DC | PRN

## 2015-07-06 MED ORDER — DEXTROSE IN LACTATED RINGERS 5 % IV SOLN
Freq: Once | INTRAVENOUS | Status: AC
  Administered 2015-07-06: 14:00:00 via INTRAVENOUS

## 2015-07-06 MED ORDER — ONDANSETRON HCL 4 MG/2ML IJ SOLN
4.0000 mg | Freq: Three times a day (TID) | INTRAMUSCULAR | Status: DC
  Administered 2015-07-06 – 2015-07-07 (×2): 4 mg via INTRAVENOUS
  Filled 2015-07-06 (×2): qty 2

## 2015-07-06 MED ORDER — LACTATED RINGERS IV SOLN
INTRAVENOUS | Status: DC
  Administered 2015-07-06: 17:00:00 via INTRAVENOUS

## 2015-07-06 MED ORDER — LACTATED RINGERS IV SOLN
INTRAVENOUS | Status: DC
  Administered 2015-07-07 (×2): via INTRAVENOUS

## 2015-07-06 NOTE — H&P (Signed)
  30 year old female G 3 P0020 at 27 w 3 days presents with left flank pain, nausea and vomiting.  Has had hyperemesis this pregnancy.  Was seen this weekend at [facility] with abdominal pain and was diagnosed with constipation.  On exam today, pain is localized to left flank/renal region. Abdomen is otherwise benign.  UA shows large Hemoglobin and 6 - 20 RBC. Renal ultrasound shows bilateral ureteral jets but hydronephrosis on the LEFT.  Patient admitted for observation for probable left renal colic.  BP 121/85 mmHg  Pulse 103  Temp(Src) 98.2 F (36.8 C) (Oral)  Resp 22  LMP 03/20/2015 Results for orders placed or performed during the hospital encounter of 07/06/15 (from the past 24 hour(s))  Urinalysis, Routine w reflex microscopic (not at [facility])     Status: Abnormal   Collection Time: 07/06/15 11:35 AM  Result Value Ref Range   Color, Urine AMBER (A) YELLOW   APPearance CLEAR CLEAR   Specific Gravity, Urine >1.030 (H) 1.005 - 1.030   pH 6.0 5.0 - 8.0   Glucose, UA NEGATIVE NEGATIVE mg/dL   Hgb urine dipstick LARGE (A) NEGATIVE   Bilirubin Urine SMALL (A) NEGATIVE   Ketones, ur >80 (A) NEGATIVE mg/dL   Protein, ur NEGATIVE NEGATIVE mg/dL   Nitrite NEGATIVE NEGATIVE   Leukocytes, UA NEGATIVE NEGATIVE  Urine microscopic-add on     Status: Abnormal   Collection Time: 07/06/15 11:35 AM  Result Value Ref Range   Squamous Epithelial / LPF 6-30 (A) NONE SEEN   WBC, UA 0-5 0 - 5 WBC/hpf   RBC / HPF 6-30 0 - 5 RBC/hpf   Bacteria, UA MANY (A) NONE SEEN   Urine-Other MUCOUS PRESENT    IMPRESSION: IUP at 15 w 3 days Left renal colic  PLAN: Admit IV hydration Pain meds and antiemetics Strain all urine Urology consult tomorrow if not improved.

## 2015-07-06 NOTE — MAU Note (Signed)
Pt C/O hyperemesis, seen in MAU on Saturday for abd pain, also on Sunday - dx'd with constipation.  Severe L lower back pain started @ 0430 this a.m.  Pt had enemas here on Sunday.  Denies bleeding.  Has lower abd soreness.

## 2015-07-06 NOTE — MAU Provider Note (Signed)
Chief Complaint: Back Pain and Emesis During Pregnancy   First Provider Initiated Contact with Patient 07/06/15 1318     SUBJECTIVE HPI  Heather [name] is a 30 y.o. G3P0020 at [redacted]w[redacted]d by LMP who presents to maternity admissions reporting severe, worsening mid-back pain.  Also continues to have nausea and vomiting and feels she is dehydrated.  Has been seen several times for similar symptoms. Had a renal US on 1/14 which showed normal findings except for fullness on left renal pelvis. Abdominal US was negative except for constipation. Was given an enema with some results.   She denies vaginal bleeding, vaginal itching/burning, urinary symptoms, h/a, dizziness, n/v, or fever/chills.    RN Note: Pt C/O hyperemesis, seen in MAU on Saturday for abd pain, also on Sunday - dx'd with constipation. Severe L lower back pain started @ 0430 this a.m. Pt had enemas here on Sunday. Denies bleeding. Has lower abd soreness.        Past Medical History  Diagnosis Date  . Chest pain   . Shortness of breath dyspnea     with chest pains  . Anginal pain (HCC)     cardiac workup normal  . Hypothyroidism     borderline  . Anxiety   . Depression   . GERD (gastroesophageal reflux disease)     with pregnancy  . Headache     migraines  . Miscarriage within last 12 months March 2016  . Hashimoto's disease    Past Surgical History  Procedure Laterality Date  . Laparoscopic appendectomy N/A 01/13/2014    Procedure: APPENDECTOMY LAPAROSCOPIC;  Surgeon: Cherylynn Ridges, MD;  Location: [facility] OR;  Service: General;  Laterality: N/A;  . Tooth extraction    . Appendectomy    . Dilation and evacuation N/A 08/27/2014    Procedure: DILATATION AND EVACUATION WITH CHROMOSOMES STUDES, ULTRASOUND;  Surgeon: Harold Hedge, MD;  Location: [facility] ORS;  Service: Gynecology;  Laterality: N/A;   Social History   Social History  . Marital Status: Married    Spouse Name: N/A  . Number of Children: N/A  . Years of Education:  N/A   Occupational History  . Not on file.   Social History Main Topics  . Smoking status: Never Smoker   . Smokeless tobacco: Never Used  . Alcohol Use: No  . Drug Use: No  . Sexual Activity: Yes   Other Topics Concern  . Not on file   Social History Narrative   No current facility-administered medications on file prior to encounter.   Current Outpatient Prescriptions on File Prior to Encounter  Medication Sig Dispense Refill  . calcium carbonate (TUMS - DOSED IN MG ELEMENTAL CALCIUM) 500 MG chewable tablet Chew 2 tablets by mouth 3 (three) times daily as needed for indigestion or heartburn.    . levothyroxine (SYNTHROID, LEVOTHROID) 50 MCG tablet Take 50-75 mcg by mouth daily before breakfast. Pt takes on Tuesday and all other days.    . metoCLOPramide (REGLAN) 10 MG tablet Take 1 tablet (10 mg total) by mouth 3 (three) times daily before meals. 90 tablet 1  . Prenatal Vit-Fe Fumarate-FA (PRENATAL MULTIVITAMIN) TABS tablet Take 1 tablet by mouth daily.     . promethazine (PHENERGAN) 25 MG tablet Take 0.5-1 tablets (12.5-25 mg total) by mouth every 6 (six) hours as needed for nausea. 30 tablet 2  . ranitidine (ZANTAC) 150 MG tablet Take 1 tablet (150 mg total) by mouth 2 (two) times daily. 60 tablet 0  Allergies  Allergen Reactions  . Penicillins Other (See Comments)    Reaction:  Unknown Pt states that her entire family has a reaction to this medication.      I have reviewed patient's Past Medical Hx, Surgical Hx, Family Hx, Social Hx, medications and allergies.   ROS:  Review of Systems Review of Systems  Constitutional: Negative for fever and chills.  Gastrointestinal: Negative for  diarrhea and positive for constipation, nausea, vomiting, abdominal pain, Genitourinary: Negative for dysuria.  Musculoskeletal: Positive for back pain, mostly on left  Neurological: Negative for dizziness and weakness.    Physical Exam  Patient Vitals for the past 24  hrs:  BP Temp Temp src Pulse Resp  07/06/15 1242 121/85 mmHg 98.2 F (36.8 C) Oral 103 22     Physical Exam  Constitutional: Well-developed, well-nourished female in significant pain, crying and bent over  Cardiovascular: normal rhythm, slight tachycardia Respiratory: normal effort, no distress GI: Abd soft, non-tender. Pos BS x 4 MS: Extremities nontender, no edema, normal ROM Neurologic: Alert and oriented x 4.  GU: +  CVAT on left  FHT 144 by doppler  LAB RESULTS Results for orders placed or performed during the hospital encounter of 07/06/15 (from the past 24 hour(s))  Urinalysis, Routine w reflex microscopic (not at [facility])     Status: Abnormal   Collection Time: 07/06/15 11:35 AM  Result Value Ref Range   Color, Urine AMBER (A) YELLOW   APPearance CLEAR CLEAR   Specific Gravity, Urine >1.030 (H) 1.005 - 1.030   pH 6.0 5.0 - 8.0   Glucose, UA NEGATIVE NEGATIVE mg/dL   Hgb urine dipstick LARGE (A) NEGATIVE   Bilirubin Urine SMALL (A) NEGATIVE   Ketones, ur >80 (A) NEGATIVE mg/dL   Protein, ur NEGATIVE NEGATIVE mg/dL   Nitrite NEGATIVE NEGATIVE   Leukocytes, UA NEGATIVE NEGATIVE  Urine microscopic-add on     Status: Abnormal   Collection Time: 07/06/15 11:35 AM  Result Value Ref Range   Squamous Epithelial / LPF 6-30 (A) NONE SEEN   WBC, UA 0-5 0 - 5 WBC/hpf   RBC / HPF 6-30 0 - 5 RBC/hpf   Bacteria, UA MANY (A) NONE SEEN   Urine-Other MUCOUS PRESENT        IMAGING US Renal  07/06/2015  CLINICAL DATA:  Increase left flank pain and hematuria; second trimester of pregnancy. EXAM: RENAL / URINARY TRACT ULTRASOUND COMPLETE COMPARISON:  Renal ultrasound of July 03, 2015. FINDINGS: Right Kidney: Length: 11.1 cm. 1.7 cm simple cyst is seen in lower pole. Echogenicity within normal limits. Minimal right hydronephrosis is now noted. No mass is seen. Left Kidney: Length: 13.1 cm. Echogenicity within normal limits. No mass is visualized. Minimal left hydronephrosis is now  noted. Bladder: Appears normal for degree of bladder distention. Bilateral ureteral jets are noted. IMPRESSION: Minimal bilateral hydronephrosis is now noted. Electronically Signed   By: Lupita Raider, M.D.   On: 07/06/2015 16:16   US Renal  07/03/2015  CLINICAL DATA:  Acute onset of left flank pain and microscopic hematuria. Initial encounter. EXAM: RENAL / URINARY TRACT ULTRASOUND COMPLETE COMPARISON:  CT of the abdomen and pelvis from 01/12/2014 FINDINGS: Right Kidney: Length: 10.6 cm. Echogenicity within normal limits. A 1.9 cm simple cyst is again noted at the lower pole of the right kidney. No hydronephrosis visualized. Left Kidney: Length: 12.0 cm. Echogenicity within normal limits. Slight fullness of the left upper pole calyx remains within normal limits. No mass or hydronephrosis  visualized. Bladder: Appears normal for degree of bladder distention. IMPRESSION: Unremarkable renal ultrasound. No evidence of hydronephrosis. Small right renal cyst again noted. Electronically Signed   By: Roanna Raider M.D.   On: 07/03/2015 22:55   US Abdomen Limited  07/04/2015  CLINICAL DATA:  Intermittent LEFT flank and LEFT abdomen/lower quadrant pain quadrant pain, pregnant at [redacted] weeks, decreased bowel movements recently EXAM: LIMITED ABDOMINAL ULTRASOUND COMPARISON:  None FINDINGS: Sonography of the lateral mid and lower abdomen were performed. Imaging of the gravid uterus performed and interpreted separately. No free fluid, mass or focal sonographic abnormality identified in the lateral LEFT abdomen. IMPRESSION: Negative limited abdominal ultrasound at site of symptoms in the lateral LEFT abdomen. Electronically Signed   By: Ulyses Southward M.D.   On: 07/04/2015 11:00   [place] Mfm Ob Limited  07/05/2015  OBSTETRICAL ULTRASOUND: This exam was performed within a [facility]. The OB US report was generated in the [facility] system, and faxed to the ordering physician.  This report is available in the [employer]. See the [facility] Obstetric US report via the Image Link.   MAU Management/MDM: Ordered labs and reviewed results.   Consult Dr Vincente Poli with presentation and exam findings. Renal US ordered   Treatments in MAU included Flexeril, IV fluids and Zofran.   When pain not relieved by Flexeril, Dilaudid given with good relief.  Dr Vincente Poli recontacted with [place] results. Will admit for analgesia and hydration.  ASSESSMENT SIUP at [redacted]w[redacted]d  Left flank pain  Mild left hydronephrosis, suspect renal stone Nausea and vomiting.  PLAN Admit to Hospital IV hydration IV analgesia Dr Vincente Poli on her way, will admit and follow.     Medication List    ASK your doctor about these medications        calcium carbonate 500 MG chewable tablet  Commonly known as:  TUMS - dosed in mg elemental calcium  Chew 2 tablets by mouth 3 (three) times daily as needed for indigestion or heartburn.     levothyroxine 50 MCG tablet  Commonly known as:  SYNTHROID, LEVOTHROID  Take 50-75 mcg by mouth daily before breakfast. Pt takes on Tuesday and all other days.     metoCLOPramide 10 MG tablet  Commonly known as:  REGLAN  Take 1 tablet (10 mg total) by mouth 3 (three) times daily before meals.     prenatal multivitamin Tabs tablet  Take 1 tablet by mouth daily.     promethazine 25 MG tablet  Commonly known as:  PHENERGAN  Take 0.5-1 tablets (12.5-25 mg total) by mouth every 6 (six) hours as needed for nausea.     ranitidine 150 MG tablet  Commonly known as:  ZANTAC  Take 1 tablet (150 mg total) by mouth 2 (two) times daily.         Wynelle Bourgeois CNM, MSN Certified Nurse-Midwife 07/06/2015  1:34 PM

## 2015-07-07 MED ORDER — ONDANSETRON HCL 4 MG/2ML IJ SOLN: 4.0000 mg | Freq: Three times a day (TID) | INTRAMUSCULAR | Status: DC | PRN

## 2015-07-07 MED ORDER — LEVOTHYROXINE SODIUM 75 MCG PO TABS
75.0000 ug | ORAL_TABLET | ORAL | Status: DC
  Administered 2015-07-07: 75 ug via ORAL
  Filled 2015-07-07 (×2): qty 1

## 2015-07-07 MED ORDER — LEVOTHYROXINE SODIUM 50 MCG PO TABS: 50.0000 ug | ORAL_TABLET | ORAL | Status: DC

## 2015-07-07 MED ORDER — POLYETHYLENE GLYCOL 3350 17 G PO PACK
17.0000 g | PACK | Freq: Every day | ORAL | Status: DC
  Administered 2015-07-07: 17 g via ORAL
  Filled 2015-07-07: qty 1

## 2015-07-07 MED ORDER — BISACODYL 10 MG RE SUPP: 10.0000 mg | Freq: Every day | RECTAL | Status: DC | PRN

## 2015-07-07 MED ORDER — LEVOTHYROXINE SODIUM 25 MCG PO TABS: 25.0000 ug | ORAL_TABLET | Freq: Every day | ORAL | Status: DC

## 2015-07-07 MED ORDER — DOCUSATE SODIUM 100 MG PO CAPS
100.0000 mg | ORAL_CAPSULE | Freq: Two times a day (BID) | ORAL | Status: DC
  Administered 2015-07-07: 100 mg via ORAL
  Filled 2015-07-07: qty 1

## 2015-07-07 MED ORDER — TAMSULOSIN HCL 0.4 MG PO CAPS
0.4000 mg | ORAL_CAPSULE | Freq: Every day | ORAL | Status: DC
  Administered 2015-07-07: 0.4 mg via ORAL
  Filled 2015-07-07 (×2): qty 1

## 2015-07-07 NOTE — Progress Notes (Addendum)
Progress note incorrectly charted on patient.

## 2015-07-07 NOTE — Progress Notes (Signed)
Pt reports pain somewhat improved but still present.  She feels like the pain has moved from left flank to LLQ.  Urine appears tea colored.  Pain controlled with IV dilaudid & tylenol.  Hyperemesis improved with zofran.  No BM since Sunday (after enema in MAU)  AF, VSS Gen - NAD Abd - soft, NT Ext - NT, no edema  A/P:  [redacted]w[redacted]d 1. Renal colic, nephrolithiasis - will add flomax.  Continue IVF.  Pain mngt prn 2. Hyperemesis - zofran prn, diclegis, IVF 3. Constipation - increase colace to bid, add miralax, consider dulcolax supp if no results 4. SCDs for DVT prev, encouraged ambulation

## 2015-07-07 NOTE — Progress Notes (Signed)
Pt out in wheelchair  Teaching complete  STONE sent to lab as ordered  By DR Renaldo Fiddler

## 2015-07-07 NOTE — Progress Notes (Signed)
Pt voided 275cc amber urine. There was small dark red/brown sediment throughout. Also small 0.5 x 0.3x 0.3 cm  tan stone left in the strainer.  Placed stone in specimen cup,  Told pt. to continue to strain urine.

## 2015-07-07 NOTE — Discharge Summary (Signed)
Physician Discharge Summary  Patient ID: Heather [name] MRN: [id number] DOB/AGE: 1986-02-03 30 y.o.  Admit date: 07/06/2015 Discharge date: 07/07/2015  Admission Diagnoses:  nephrolithiasis  Discharge Diagnoses: nephrolithiasis in pregnancy  Discharged Condition: stable  Hospital Course: pt was admitted with left renal colic.  Pain was controlled with IV pain meds and IVF were given.  On HD#2, sediment and kidney stone passed and pain was resolved.  Hyperemesis controlled with PO meds  Consults: None  Significant Diagnostic Studies: labs: cbc, renal ultrasound  Treatments: IV hydration and analgesia: Dilaudid  Discharge Exam: Blood pressure 90/57, pulse 90, temperature 97.8 F (36.6 C), temperature source Oral, resp. rate 16, height  (1.753 m), weight 134 lb (60.782 kg), last menstrual period 03/20/2015, SpO2 98 %, unknown if currently breastfeeding. General appearance: alert and cooperative GI: normal findings: soft, non-tender Extremities: extremities normal, atraumatic, no cyanosis or edema  Disposition: 01-Home or Self Care     Medication List    TAKE these medications        calcium carbonate 500 MG chewable tablet  Commonly known as:  TUMS - dosed in mg elemental calcium  Chew 2 tablets by mouth daily as needed for indigestion or heartburn.     levothyroxine 50 MCG tablet  Commonly known as:  SYNTHROID, LEVOTHROID  Take 50-75 mcg by mouth daily before breakfast. Pt takes on Tuesday and all other days.     metoCLOPramide 10 MG tablet  Commonly known as:  REGLAN  Take 1 tablet (10 mg total) by mouth 3 (three) times daily before meals.     prenatal multivitamin Tabs tablet  Take 1 tablet by mouth daily.     promethazine 25 MG tablet  Commonly known as:  PHENERGAN  Take 0.5-1 tablets (12.5-25 mg total) by mouth every 6 (six) hours as needed for nausea.     ranitidine 150 MG tablet  Commonly known as:  ZANTAC  Take 1 tablet (150 mg total) by  mouth 2 (two) times daily.           Follow-up Information    Follow up In 2 weeks.      Signed: Liberta Gimpel 07/07/2015, 4:49 PM

## 2015-10-29 ENCOUNTER — Encounter (HOSPITAL_COMMUNITY): Payer: Self-pay

## 2015-10-29 ENCOUNTER — Inpatient Hospital Stay (HOSPITAL_COMMUNITY)
Admission: AD | Admit: 2015-10-29 | Discharge: 2015-10-29 | Disposition: A | Discharge status: Home / Self Care | Payer: 59 | Source: Ambulatory Visit | Attending: Obstetrics and Gynecology | Admitting: Obstetrics and Gynecology

## 2015-10-29 DIAGNOSIS — O9989 Other specified diseases and conditions complicating pregnancy, childbirth and the puerperium: Secondary | ICD-10-CM | POA: Diagnosis not present

## 2015-10-29 DIAGNOSIS — O26893 Other specified pregnancy related conditions, third trimester: Secondary | ICD-10-CM | POA: Insufficient documentation

## 2015-10-29 DIAGNOSIS — R109 Unspecified abdominal pain: Secondary | ICD-10-CM | POA: Insufficient documentation

## 2015-10-29 DIAGNOSIS — O219 Vomiting of pregnancy, unspecified: Secondary | ICD-10-CM | POA: Diagnosis not present

## 2015-10-29 DIAGNOSIS — Z3A31 31 weeks gestation of pregnancy: Secondary | ICD-10-CM | POA: Insufficient documentation

## 2015-10-29 DIAGNOSIS — O210 Mild hyperemesis gravidarum: Secondary | ICD-10-CM

## 2015-10-29 DIAGNOSIS — O26899 Other specified pregnancy related conditions, unspecified trimester: Secondary | ICD-10-CM

## 2015-10-29 LAB — URINALYSIS, ROUTINE W REFLEX MICROSCOPIC
Bilirubin Urine: NEGATIVE
Glucose, UA: NEGATIVE mg/dL
KETONES UR: 15 mg/dL — AB
LEUKOCYTES UA: NEGATIVE
NITRITE: NEGATIVE
PROTEIN: 100 mg/dL — AB
Specific Gravity, Urine: 1.025 (ref 1.005–1.030)
pH: 6.5 (ref 5.0–8.0)

## 2015-10-29 LAB — URINE MICROSCOPIC-ADD ON

## 2015-10-29 MED ORDER — ONDANSETRON 8 MG PO TBDP
8.0000 mg | ORAL_TABLET | Freq: Once | ORAL | Status: AC
  Administered 2015-10-29: 8 mg via ORAL
  Filled 2015-10-29: qty 1

## 2015-10-29 NOTE — MAU Note (Signed)
Hx hyperemesis. About 1530 threw up which haven't done in awhile. Took a nap and when woke up about 1630 was having some mid to lower abd pain. Pain is sharp and constant. Denies LOF or bleeding. No diarrhea

## 2015-10-29 NOTE — Discharge Instructions (Signed)

## 2015-10-29 NOTE — MAU Provider Note (Signed)
MAU HISTORY AND PHYSICAL  Chief Complaint:  Abdominal Pain   Heather [name] is a 30 y.o.  G3P0020 with IUP at [redacted]w[redacted]d presenting for Abdominal Pain  History hyperemesis this pregnancy that is esentially resolved for past 2 months, but ptialism continues.  Today had abrupt onset nausea with vomiting, felt ok after that. Then later awoke with lower bilateral abdominal pain, constant not coming and going. Slightly improved, now uncomfortable. No sick contacts. No fevers, no back pain. No vaginal bleeding, no lof. Positive fetal movements. Occasional mild braxton hicks contraction not worse recently. No diarrhea or constipation. Hx appendectomy.  Past Medical History  Diagnosis Date  . Chest pain   . Shortness of breath dyspnea     with chest pains  . Anginal pain (HCC)     cardiac workup normal  . Hypothyroidism     borderline  . Anxiety   . Depression   . GERD (gastroesophageal reflux disease)     with pregnancy  . Headache     migraines  . Miscarriage within last 12 months March 2016  . Hashimoto's disease     Past Surgical History  Procedure Laterality Date  . Laparoscopic appendectomy N/A 01/13/2014    Procedure: APPENDECTOMY LAPAROSCOPIC;  Surgeon: Cherylynn Ridges, MD;  Location: [facility] OR;  Service: General;  Laterality: N/A;  . Tooth extraction    . Appendectomy    . Dilation and evacuation N/A 08/27/2014    Procedure: DILATATION AND EVACUATION WITH CHROMOSOMES STUDES, ULTRASOUND;  Surgeon: Harold Hedge, MD;  Location: [facility] ORS;  Service: Gynecology;  Laterality: N/A;    Family History  Problem Relation Age of Onset  . Hypertension Mother   . Diabetes Mother     Social History  Substance Use Topics  . Smoking status: Never Smoker   . Smokeless tobacco: Never Used  . Alcohol Use: No    Allergies  Allergen Reactions  . Penicillins Other (See Comments)    Prescriptions prior to admission  Medication Sig Dispense Refill Last Dose  . calcium carbonate (TUMS - DOSED IN  MG ELEMENTAL CALCIUM) 500 MG chewable tablet Chew 2 tablets by mouth daily as needed for indigestion or heartburn.    Past Week at Unknown time  . levothyroxine (SYNTHROID, LEVOTHROID) 112 MCG tablet Take 112 mcg by mouth daily before breakfast.   10/29/2015 at Unknown time  . pantoprazole (PROTONIX) 40 MG tablet Take 40 mg by mouth daily.   10/29/2015 at Unknown time  . Prenatal Vit-Fe Fumarate-FA (PRENATAL MULTIVITAMIN) TABS tablet Take 1 tablet by mouth daily.    10/28/2015 at Unknown time  . metoCLOPramide (REGLAN) 10 MG tablet Take 1 tablet (10 mg total) by mouth 3 (three) times daily before meals. (Patient not taking: Reported on 10/29/2015) 90 tablet 1 More than a month at Unknown time  . promethazine (PHENERGAN) 25 MG tablet Take 0.5-1 tablets (12.5-25 mg total) by mouth every 6 (six) hours as needed for nausea. (Patient not taking: Reported on 10/29/2015) 30 tablet 2 More than a month at Unknown time  . ranitidine (ZANTAC) 150 MG tablet Take 1 tablet (150 mg total) by mouth 2 (two) times daily. (Patient not taking: Reported on 10/29/2015) 60 tablet 0 More than a month at Unknown time    Review of Systems - Negative except for what is mentioned in HPI.  Physical Exam  Blood pressure 105/71, pulse 90, temperature 98.1 F (36.7 C), resp. rate 18, height  (1.753 m), weight 152 lb 3.2 oz (69.037  kg), last menstrual period 03/20/2015, unknown if currently breastfeeding. GENERAL: Well-developed, well-nourished female in no acute distress.  LUNGS: Clear to auscultation bilaterally.  HEART: Regular rate and rhythm. ABDOMEN: Soft, nontender, nondistended, gravid.  EXTREMITIES: Nontender, no edema, 2+ distal pulses. Cervical Exam: closed/thick/high FHT:  135/mod/+a/-d Contractions: uterine irritability   Labs: Results for orders placed or performed during the hospital encounter of 10/29/15 (from the past 24 hour(s))  Urinalysis, Routine w reflex microscopic ([facility])   Collection Time:  10/29/15  7:20 PM  Result Value Ref Range   Color, Urine YELLOW YELLOW   APPearance TURBID (A) CLEAR   Specific Gravity, Urine 1.025 1.005 - 1.030   pH 6.5 5.0 - 8.0   Glucose, UA NEGATIVE NEGATIVE mg/dL   Hgb urine dipstick LARGE (A) NEGATIVE   Bilirubin Urine NEGATIVE NEGATIVE   Ketones, ur 15 (A) NEGATIVE mg/dL   Protein, ur 161100 (A) NEGATIVE mg/dL   Nitrite NEGATIVE NEGATIVE   Leukocytes, UA NEGATIVE NEGATIVE  Urine microscopic-add on   Collection Time: 10/29/15  7:20 PM  Result Value Ref Range   Squamous Epithelial / LPF 0-5 (A) NONE SEEN   WBC, UA 0-5 0 - 5 WBC/hpf   RBC / HPF TOO NUMEROUS TO COUNT 0 - 5 RBC/hpf   Bacteria, UA FEW (A) NONE SEEN   Urine-Other MUCOUS PRESENT     Imaging Studies:  No results found.  Assessment: Heather [name] is  30 y.o. G3P0020 at 2797w6d presents with Abdominal Pain Non-specific lower abdominal pain that is spontaneously gradually improving. No contractions, no vaginal bleeding, and closed cervix - do not think ptl or abruption. Urinalysis not suggestive of infection. Recent n/v may contribute to abdominal pain. Here nst reactive and with zofran tolerated PO challenge. Well-appearing. Discussed w/ Dr. Henderson Cloudomblin.  Plan: - d/c home with zofran prn (has zofran at home), push fluids, and abdominal pain, ptl, and abruption return precautions  Silvano Bilisoah B Trevaun Rendleman 5/12/20179:02 PM

## 2015-10-31 LAB — CULTURE, OB URINE

## 2015-11-02 ENCOUNTER — Encounter (HOSPITAL_COMMUNITY): Payer: Self-pay

## 2015-11-02 ENCOUNTER — Inpatient Hospital Stay (HOSPITAL_COMMUNITY)
Admission: AD | Admit: 2015-11-02 | Discharge: 2015-11-02 | Disposition: A | Discharge status: Home / Self Care | Payer: 59 | Source: Ambulatory Visit | Attending: Obstetrics and Gynecology | Admitting: Obstetrics and Gynecology

## 2015-11-02 DIAGNOSIS — K219 Gastro-esophageal reflux disease without esophagitis: Secondary | ICD-10-CM | POA: Diagnosis not present

## 2015-11-02 DIAGNOSIS — E063 Autoimmune thyroiditis: Secondary | ICD-10-CM | POA: Diagnosis not present

## 2015-11-02 DIAGNOSIS — R109 Unspecified abdominal pain: Secondary | ICD-10-CM | POA: Insufficient documentation

## 2015-11-02 DIAGNOSIS — R1030 Lower abdominal pain, unspecified: Secondary | ICD-10-CM | POA: Diagnosis present

## 2015-11-02 DIAGNOSIS — O99613 Diseases of the digestive system complicating pregnancy, third trimester: Secondary | ICD-10-CM | POA: Insufficient documentation

## 2015-11-02 DIAGNOSIS — R110 Nausea: Secondary | ICD-10-CM | POA: Insufficient documentation

## 2015-11-02 DIAGNOSIS — Z880 Allergy status to penicillin: Secondary | ICD-10-CM | POA: Diagnosis not present

## 2015-11-02 DIAGNOSIS — O9989 Other specified diseases and conditions complicating pregnancy, childbirth and the puerperium: Secondary | ICD-10-CM

## 2015-11-02 DIAGNOSIS — O26893 Other specified pregnancy related conditions, third trimester: Secondary | ICD-10-CM | POA: Diagnosis not present

## 2015-11-02 DIAGNOSIS — Z3A34 34 weeks gestation of pregnancy: Secondary | ICD-10-CM | POA: Insufficient documentation

## 2015-11-02 DIAGNOSIS — O26899 Other specified pregnancy related conditions, unspecified trimester: Secondary | ICD-10-CM

## 2015-11-02 DIAGNOSIS — K5900 Constipation, unspecified: Secondary | ICD-10-CM | POA: Insufficient documentation

## 2015-11-02 DIAGNOSIS — O219 Vomiting of pregnancy, unspecified: Secondary | ICD-10-CM

## 2015-11-02 LAB — URINALYSIS, ROUTINE W REFLEX MICROSCOPIC
Bilirubin Urine: NEGATIVE
Glucose, UA: NEGATIVE mg/dL
Ketones, ur: NEGATIVE mg/dL
Leukocytes, UA: NEGATIVE
Nitrite: NEGATIVE
Protein, ur: 30 mg/dL — AB
Specific Gravity, Urine: 1.02 (ref 1.005–1.030)
pH: 7 (ref 5.0–8.0)

## 2015-11-02 LAB — URINE MICROSCOPIC-ADD ON
Bacteria, UA: NONE SEEN
WBC, UA: NONE SEEN WBC/hpf (ref 0–5)

## 2015-11-02 MED ORDER — METOCLOPRAMIDE HCL 5 MG/ML IJ SOLN
10.0000 mg | Freq: Once | INTRAMUSCULAR | Status: AC
  Administered 2015-11-02: 10 mg via INTRAVENOUS
  Filled 2015-11-02: qty 2

## 2015-11-02 MED ORDER — LACTATED RINGERS IV BOLUS (SEPSIS)
1000.0000 mL | Freq: Once | INTRAVENOUS | Status: AC
  Administered 2015-11-02: 1000 mL via INTRAVENOUS

## 2015-11-02 MED ORDER — TERBUTALINE SULFATE 1 MG/ML IJ SOLN
0.2500 mg | Freq: Once | INTRAMUSCULAR | Status: AC
  Administered 2015-11-02: 0.25 mg via SUBCUTANEOUS
  Filled 2015-11-02: qty 1

## 2015-11-02 NOTE — MAU Note (Signed)
Came in Friday, Pain in lower abdomen, vomitting.  Over the weekend the pain has stayed, sharp in front of abdomen, and n/v

## 2015-11-02 NOTE — Discharge Instructions (Signed)

## 2015-11-02 NOTE — MAU Provider Note (Signed)
History     CSN: [id number]  Arrival date and time: 11/02/15 1215   First Provider Initiated Contact with Patient 11/02/15 1306      Chief Complaint  Patient presents with  . Nausea  . Emesis During Pregnancy  . Abdominal Pain   HPI Pt is [redacted]w[redacted]d G3P0020, who presents with lower abdominal pain that lasts at times for a couple of hours.  The pain also keeps pt awake at night. Pt has been nauseated and seen on Friday- pt has taken Zofran and also is constipation.  Pt's last normal BM was last Thursday.  She has had a little small movement this morning. Pt also feels tightening in her abdomen but that is not the pain. Pt admits to go FM. Pt denies UTI sx or back pain, fever or chills, vaginal discharge or bleeding. RN note:      Expand All Collapse All   Came in Friday, Pain in lower abdomen, vomitting. Over the weekend the pain has stayed, sharp in front of abdomen, and n/v       Past Medical History  Diagnosis Date  . Chest pain   . Shortness of breath dyspnea     with chest pains  . Anginal pain (HCC)     cardiac workup normal  . Hypothyroidism     borderline  . Anxiety   . Depression   . GERD (gastroesophageal reflux disease)     with pregnancy  . Headache     migraines  . Miscarriage within last 12 months March 2016  . Hashimoto's disease     Past Surgical History  Procedure Laterality Date  . Laparoscopic appendectomy N/A 01/13/2014    Procedure: APPENDECTOMY LAPAROSCOPIC;  Surgeon: Cherylynn Ridges, MD;  Location: [facility] OR;  Service: General;  Laterality: N/A;  . Tooth extraction    . Appendectomy    . Dilation and evacuation N/A 08/27/2014    Procedure: DILATATION AND EVACUATION WITH CHROMOSOMES STUDES, ULTRASOUND;  Surgeon: Harold Hedge, MD;  Location: [facility] ORS;  Service: Gynecology;  Laterality: N/A;    Family History  Problem Relation Age of Onset  . Hypertension Mother   . Diabetes Mother   . Cancer Maternal Grandmother   . Cancer Maternal Grandfather      Social History  Substance Use Topics  . Smoking status: Never Smoker   . Smokeless tobacco: Never Used  . Alcohol Use: No    Allergies:  Allergies  Allergen Reactions  . Penicillins Other (See Comments)    Reaction:  Unknown- never tried to give to her. Pt states that her entire family has a reaction to this medication.  Has patient had a PCN reaction causing immediate rash, facial/tongue/throat swelling, SOB or lightheadedness with hypotension: unknown Has patient had a PCN reaction causing severe rash involving mucus membranes or skin necrosis: No Has patient had a PCN reaction that required hospitalization No Has patient had a PCN reaction occurring within the last 10    Prescriptions prior to admission  Medication Sig Dispense Refill Last Dose  . calcium carbonate (TUMS - DOSED IN MG ELEMENTAL CALCIUM) 500 MG chewable tablet Chew 2 tablets by mouth daily as needed for indigestion or heartburn.    Past Week at Unknown time  . levothyroxine (SYNTHROID, LEVOTHROID) 112 MCG tablet Take 112 mcg by mouth daily before breakfast.   10/29/2015 at Unknown time  . metoCLOPramide (REGLAN) 10 MG tablet Take 1 tablet (10 mg total) by mouth 3 (three) times daily before meals. (  Patient not taking: Reported on 10/29/2015) 90 tablet 1 More than a month at Unknown time  . pantoprazole (PROTONIX) 40 MG tablet Take 40 mg by mouth daily.   10/29/2015 at Unknown time  . Prenatal Vit-Fe Fumarate-FA (PRENATAL MULTIVITAMIN) TABS tablet Take 1 tablet by mouth daily.    10/28/2015 at Unknown time  . promethazine (PHENERGAN) 25 MG tablet Take 0.5-1 tablets (12.5-25 mg total) by mouth every 6 (six) hours as needed for nausea. (Patient not taking: Reported on 10/29/2015) 30 tablet 2 More than a month at Unknown time    Review of Systems  Constitutional: Negative for fever and chills.  Gastrointestinal: Positive for nausea, vomiting, abdominal pain and constipation. Negative for diarrhea.  Genitourinary:  Negative for dysuria.   Physical Exam   Blood pressure 99/67, pulse 99, temperature 98.1 F (36.7 C), temperature source Oral, resp. rate 20, height 5\' 9"  (1.753 m), weight 153 lb 12.8 oz (69.763 kg), last menstrual period 03/20/2015, currently breastfeeding.  Physical Exam  Vitals reviewed. Constitutional: She is oriented to person, place, and time. She appears well-developed and well-nourished. No distress.  HENT:  Head: Normocephalic.  Eyes: Pupils are equal, round, and reactive to light.  Neck: Normal range of motion. Neck supple.  Cardiovascular: Normal rate.   Respiratory: Effort normal.  GI: Soft. She exhibits no distension. There is no tenderness. There is no rebound and no guarding.  No pain with palpation; reactive NST  Genitourinary:  Cervix FT-1cm; thick, posterior, high  Musculoskeletal: Normal range of motion.  Neurological: She is alert and oriented to person, place, and time.  Skin: Skin is warm and dry.  Psychiatric: She has a normal mood and affect.    MAU Course  Procedures Results for orders placed or performed during the hospital encounter of 11/02/15 (from the past 24 hour(s))  Urinalysis, Routine w reflex microscopic ([facility])     Status: Abnormal   Collection Time: 11/02/15 12:32 PM  Result Value Ref Range   Color, Urine YELLOW YELLOW   APPearance CLOUDY (A) CLEAR   Specific Gravity, Urine 1.020 1.005 - 1.030   pH 7.0 5.0 - 8.0   Glucose, UA NEGATIVE NEGATIVE mg/dL   Hgb urine dipstick LARGE (A) NEGATIVE   Bilirubin Urine NEGATIVE NEGATIVE   Ketones, ur NEGATIVE NEGATIVE mg/dL   Protein, ur 30 (A) NEGATIVE mg/dL   Nitrite NEGATIVE NEGATIVE   Leukocytes, UA NEGATIVE NEGATIVE  Urine microscopic-add on     Status: Abnormal   Collection Time: 11/02/15 12:32 PM  Result Value Ref Range   Squamous Epithelial / LPF 0-5 (A) NONE SEEN   WBC, UA NONE SEEN 0 - 5 WBC/hpf   RBC / HPF 6-30 0 - 5 RBC/hpf   Bacteria, UA NONE SEEN NONE SEEN   Urine culture  ordered- pending FHR baseline 130 bpm with 15x15 accelerations- initally pt having frequent mild ctx with UI- 1 L of LR  Terbutaline given; pt feeling nauseated and wanted something for her nausea- reglan 10mg  IV given 2nd bag of LR given- pt feeling much better- ctx ceased Cervix, posterior thick, - pt 's pain much improved- to return if increase in pain or concerns Discussed with Dr. Vincente PoliGrewal- will d/c home Assessment and Plan  Abdominal pain in 3rd trimester- subsided Constipation- discussed colace, miralax and dulcolax or Fleets enema if needed nausea- pt has Rx at home Keep OB appointment   LINEBERRY,SUSAN 11/02/2015, 1:13 PM

## 2015-11-03 LAB — CULTURE, OB URINE: CULTURE: NO GROWTH

## 2015-11-17 ENCOUNTER — Encounter (HOSPITAL_COMMUNITY): Payer: Self-pay

## 2015-11-17 ENCOUNTER — Inpatient Hospital Stay (HOSPITAL_COMMUNITY)
Admission: AD | Admit: 2015-11-17 | Discharge: 2015-11-17 | Disposition: A | Discharge status: Home / Self Care | Payer: 59 | Source: Ambulatory Visit | Attending: Obstetrics & Gynecology | Admitting: Obstetrics & Gynecology

## 2015-11-17 DIAGNOSIS — Z880 Allergy status to penicillin: Secondary | ICD-10-CM | POA: Insufficient documentation

## 2015-11-17 DIAGNOSIS — B373 Candidiasis of vulva and vagina: Secondary | ICD-10-CM | POA: Insufficient documentation

## 2015-11-17 DIAGNOSIS — K219 Gastro-esophageal reflux disease without esophagitis: Secondary | ICD-10-CM | POA: Diagnosis not present

## 2015-11-17 DIAGNOSIS — O98813 Other maternal infectious and parasitic diseases complicating pregnancy, third trimester: Secondary | ICD-10-CM | POA: Diagnosis not present

## 2015-11-17 DIAGNOSIS — O99613 Diseases of the digestive system complicating pregnancy, third trimester: Secondary | ICD-10-CM | POA: Insufficient documentation

## 2015-11-17 DIAGNOSIS — Z3A34 34 weeks gestation of pregnancy: Secondary | ICD-10-CM | POA: Diagnosis not present

## 2015-11-17 DIAGNOSIS — T50905A Adverse effect of unspecified drugs, medicaments and biological substances, initial encounter: Secondary | ICD-10-CM | POA: Insufficient documentation

## 2015-11-17 DIAGNOSIS — B3731 Acute candidiasis of vulva and vagina: Secondary | ICD-10-CM

## 2015-11-17 DIAGNOSIS — R102 Pelvic and perineal pain: Secondary | ICD-10-CM | POA: Insufficient documentation

## 2015-11-17 LAB — URINALYSIS, ROUTINE W REFLEX MICROSCOPIC
BILIRUBIN URINE: NEGATIVE
Glucose, UA: NEGATIVE mg/dL
Ketones, ur: 15 mg/dL — AB
Leukocytes, UA: NEGATIVE
NITRITE: NEGATIVE
Protein, ur: NEGATIVE mg/dL
SPECIFIC GRAVITY, URINE: 1.025 (ref 1.005–1.030)
pH: 6 (ref 5.0–8.0)

## 2015-11-17 LAB — URINE MICROSCOPIC-ADD ON

## 2015-11-17 MED ORDER — LACTATED RINGERS IV BOLUS (SEPSIS): 1000.0000 mL | Freq: Once | INTRAVENOUS | Status: DC

## 2015-11-17 MED ORDER — FLUCONAZOLE 150 MG PO TABS
150.0000 mg | ORAL_TABLET | Freq: Once | ORAL | Status: AC
  Administered 2015-11-17: 150 mg via ORAL
  Filled 2015-11-17: qty 1

## 2015-11-17 NOTE — Discharge Instructions (Signed)

## 2015-11-17 NOTE — MAU Note (Signed)
Pt reports vaginal itching x one week, called office today and terazol was called in. Used just prior to bed and awakened later with very intense itching and lower abd cramping.

## 2015-11-17 NOTE — MAU Provider Note (Signed)
History     CSN: [id number]  Arrival date and time: 11/17/15 7829   First Provider Initiated Contact with Patient 11/17/15 0128      Chief Complaint  Patient presents with  . Vaginal Pain   HPI Comments: Heather [name] is a 30 y.o. G3P0020 at [redacted]w[redacted]d who presents today with vaginal pain. She states that she had itching and burning with urination for 2 days. She called the office, and was given a rx for terazol. She states that she used the terazol around 2100 and awoke around 2200 with "terrible burning and itching". She applied vagisil to the area, and it helped some. However, then she had cramping. She denies any VB or LOF. She states that the fetus has been moving normally.   Vaginal Pain The patient's primary symptoms include pelvic pain. This is a new problem. The current episode started today. The problem occurs constantly. Pain severity now: 4/10  The problem affects both sides. She is pregnant. Associated symptoms include abdominal pain, dysuria and frequency. Pertinent negatives include no chills, constipation, diarrhea, fever, nausea, urgency or vomiting. She has tried antifungals for the symptoms. Improvement on treatment: made the burning worse, and caused the cramping to start.     Past Medical History  Diagnosis Date  . Chest pain   . Shortness of breath dyspnea     with chest pains  . Anginal pain (HCC)     cardiac workup normal  . Hypothyroidism     borderline  . Anxiety   . Depression   . GERD (gastroesophageal reflux disease)     with pregnancy  . Headache     migraines  . Miscarriage within last 12 months March 2016  . Hashimoto's disease     Past Surgical History  Procedure Laterality Date  . Laparoscopic appendectomy N/A 01/13/2014    Procedure: APPENDECTOMY LAPAROSCOPIC;  Surgeon: Cherylynn Ridges, MD;  Location: [facility] OR;  Service: General;  Laterality: N/A;  . Tooth extraction    . Appendectomy    . Dilation and evacuation N/A 08/27/2014    Procedure:  DILATATION AND EVACUATION WITH CHROMOSOMES STUDES, ULTRASOUND;  Surgeon: Harold Hedge, MD;  Location: [facility] ORS;  Service: Gynecology;  Laterality: N/A;    Family History  Problem Relation Age of Onset  . Hypertension Mother   . Diabetes Mother   . Cancer Maternal Grandmother   . Cancer Maternal Grandfather     Social History  Substance Use Topics  . Smoking status: Never Smoker   . Smokeless tobacco: Never Used  . Alcohol Use: No    Allergies:  Allergies  Allergen Reactions  . Penicillins Other (See Comments)    Reaction:  Unknown- never tried to give to her. Pt states that her entire family has a reaction to this medication.  Has patient had a PCN reaction causing immediate rash, facial/tongue/throat swelling, SOB or lightheadedness with hypotension: unknown Has patient had a PCN reaction causing severe rash involving mucus membranes or skin necrosis: No Has patient had a PCN reaction that required hospitalization No Has patient had a PCN reaction occurring within the last 10    Prescriptions prior to admission  Medication Sig Dispense Refill Last Dose  . calcium carbonate (TUMS - DOSED IN MG ELEMENTAL CALCIUM) 500 MG chewable tablet Chew 2 tablets by mouth daily as needed for indigestion or heartburn.    11/02/2015 at Unknown time  . levothyroxine (SYNTHROID, LEVOTHROID) 112 MCG tablet Take 112 mcg by mouth daily before breakfast.  11/02/2015 at Unknown time  . pantoprazole (PROTONIX) 40 MG tablet Take 40 mg by mouth daily.   11/02/2015 at Unknown time  . Prenatal Vit-Fe Fumarate-FA (PRENATAL MULTIVITAMIN) TABS tablet Take 1 tablet by mouth daily.    Past Week at Unknown time  . promethazine (PHENERGAN) 25 MG tablet Take 0.5-1 tablets (12.5-25 mg total) by mouth every 6 (six) hours as needed for nausea. (Patient not taking: Reported on 11/02/2015) 30 tablet 2 Not Taking at Unknown time    Review of Systems  Constitutional: Negative for fever and chills.  Gastrointestinal:  Positive for abdominal pain. Negative for nausea, vomiting, diarrhea and constipation.  Genitourinary: Positive for dysuria, frequency, vaginal pain and pelvic pain. Negative for urgency.   Physical Exam   Blood pressure 102/75, pulse 104, temperature 98.4 F (36.9 C), temperature source Oral, resp. rate 16, height 5\' 9"  (1.753 m), weight 70.308 kg (155 lb), last menstrual period 03/20/2015, SpO2 97 %, currently breastfeeding.  Physical Exam  Nursing note and vitals reviewed. Constitutional: She is oriented to person, place, and time. She appears well-developed and well-nourished. No distress.  HENT:  Head: Normocephalic.  Cardiovascular: Normal rate.   Respiratory: Effort normal.  GI: Soft. There is no tenderness. There is no rebound.  Genitourinary:   External: no lesion Vagina: large amount of antifungal cream in the vagina. Once wiped away the vagina does appear erythematous, but no discharge noted.  Cervix: pink, smooth, closed/thick/ballotable.  Uterus: AGA  Neurological: She is alert and oriented to person, place, and time.  Skin: Skin is warm and dry.  Psychiatric: She has a normal mood and affect.   FHT: 120. Moderate with 15x15 accels, no decels Toco: about every 2-3 mins  MAU Course  Procedures  MDM 0218: Contractions have spaced. Now about every 7 mins  0220: D/W Dr. Langston MaskerMorris, ok to DC IV fluids. Will give one dose of diflucan here, and then patient can FU in the office as planned.   Assessment and Plan   1. Medication reaction, initial encounter   2. Yeast infection involving the vagina and surrounding area    DC home Comfort measures reviewed  3rd Trimester precautions  PTL precautions  Fetal kick counts RX: none  Return to MAU as needed   Follow-up Information    Follow up with MORRIS, MEGAN, DO.   Specialty:  Obstetrics and Gynecology   Why:  As scheduled   Contact information:   [address], [address], [address] [place]  [place] [id number] [phone]         Tawnya CrookHogan, Lafreda Casebeer Donovan 11/17/2015, 1:30 AM

## 2015-12-27 ENCOUNTER — Encounter (HOSPITAL_COMMUNITY): Payer: Self-pay | Admitting: *Deleted

## 2015-12-27 ENCOUNTER — Telehealth (HOSPITAL_COMMUNITY): Payer: Self-pay | Admitting: *Deleted

## 2015-12-27 LAB — OB RESULTS CONSOLE GBS: GBS: NEGATIVE

## 2015-12-27 NOTE — Telephone Encounter (Signed)
Preadmission screen  

## 2015-12-28 ENCOUNTER — Inpatient Hospital Stay (HOSPITAL_COMMUNITY)
Admission: RE | Admit: 2015-12-28 | Discharge: 2015-12-30 | DRG: 775 | Disposition: A | Discharge status: Home / Self Care | Payer: 59 | Source: Ambulatory Visit | Attending: Obstetrics & Gynecology | Admitting: Obstetrics & Gynecology

## 2015-12-28 ENCOUNTER — Inpatient Hospital Stay (HOSPITAL_COMMUNITY): Payer: 59 | Admitting: Anesthesiology

## 2015-12-28 ENCOUNTER — Encounter (HOSPITAL_COMMUNITY): Payer: Self-pay

## 2015-12-28 DIAGNOSIS — E039 Hypothyroidism, unspecified: Secondary | ICD-10-CM | POA: Diagnosis present

## 2015-12-28 DIAGNOSIS — Z3A40 40 weeks gestation of pregnancy: Secondary | ICD-10-CM | POA: Diagnosis not present

## 2015-12-28 DIAGNOSIS — K219 Gastro-esophageal reflux disease without esophagitis: Secondary | ICD-10-CM | POA: Diagnosis present

## 2015-12-28 DIAGNOSIS — Z3490 Encounter for supervision of normal pregnancy, unspecified, unspecified trimester: Secondary | ICD-10-CM

## 2015-12-28 DIAGNOSIS — F418 Other specified anxiety disorders: Secondary | ICD-10-CM | POA: Diagnosis present

## 2015-12-28 DIAGNOSIS — Z833 Family history of diabetes mellitus: Secondary | ICD-10-CM | POA: Diagnosis not present

## 2015-12-28 DIAGNOSIS — O480 Post-term pregnancy: Principal | ICD-10-CM | POA: Diagnosis present

## 2015-12-28 DIAGNOSIS — Z8249 Family history of ischemic heart disease and other diseases of the circulatory system: Secondary | ICD-10-CM | POA: Diagnosis not present

## 2015-12-28 DIAGNOSIS — O99344 Other mental disorders complicating childbirth: Secondary | ICD-10-CM | POA: Diagnosis present

## 2015-12-28 DIAGNOSIS — O99284 Endocrine, nutritional and metabolic diseases complicating childbirth: Secondary | ICD-10-CM | POA: Diagnosis present

## 2015-12-28 DIAGNOSIS — O9962 Diseases of the digestive system complicating childbirth: Secondary | ICD-10-CM | POA: Diagnosis present

## 2015-12-28 LAB — CBC
HEMATOCRIT: 31.5 % — AB (ref 36.0–46.0)
HEMOGLOBIN: 11.1 g/dL — AB (ref 12.0–15.0)
MCH: 28.8 pg (ref 26.0–34.0)
MCHC: 35.2 g/dL (ref 30.0–36.0)
MCV: 81.8 fL (ref 78.0–100.0)
Platelets: 186 10*3/uL (ref 150–400)
RBC: 3.85 MIL/uL — ABNORMAL LOW (ref 3.87–5.11)
RDW: 13.3 % (ref 11.5–15.5)
WBC: 11 10*3/uL — ABNORMAL HIGH (ref 4.0–10.5)

## 2015-12-28 LAB — TYPE AND SCREEN
ABO/RH(D): B POS
Antibody Screen: NEGATIVE

## 2015-12-28 LAB — RPR: RPR Ser Ql: NONREACTIVE

## 2015-12-28 LAB — ABO/RH: ABO/RH(D): B POS

## 2015-12-28 MED ORDER — LACTATED RINGERS IV SOLN
500.0000 mL | INTRAVENOUS | Status: DC | PRN
  Administered 2015-12-28 (×2): 1000 mL via INTRAVENOUS

## 2015-12-28 MED ORDER — OXYTOCIN 40 UNITS IN LACTATED RINGERS INFUSION - SIMPLE MED
1.0000 m[IU]/min | INTRAVENOUS | Status: DC
  Administered 2015-12-28: 2 m[IU]/min via INTRAVENOUS

## 2015-12-28 MED ORDER — LACTATED RINGERS IV SOLN
INTRAVENOUS | Status: DC
  Administered 2015-12-28 (×3): via INTRAVENOUS

## 2015-12-28 MED ORDER — EPHEDRINE 5 MG/ML INJ
10.0000 mg | INTRAVENOUS | Status: DC | PRN
  Filled 2015-12-28: qty 2

## 2015-12-28 MED ORDER — OXYCODONE-ACETAMINOPHEN 5-325 MG PO TABS: 2.0000 | ORAL_TABLET | ORAL | Status: DC | PRN

## 2015-12-28 MED ORDER — TERBUTALINE SULFATE 1 MG/ML IJ SOLN
0.2500 mg | Freq: Once | INTRAMUSCULAR | Status: DC | PRN
  Filled 2015-12-28: qty 1

## 2015-12-28 MED ORDER — CLINDAMYCIN PHOSPHATE 900 MG/50ML IV SOLN: 900.0000 mg | Freq: Once | INTRAVENOUS | Status: DC

## 2015-12-28 MED ORDER — FENTANYL 2.5 MCG/ML BUPIVACAINE 1/10 % EPIDURAL INFUSION (WH - ANES)
14.0000 mL/h | INTRAMUSCULAR | Status: DC | PRN
  Administered 2015-12-28 (×3): 14 mL/h via EPIDURAL
  Filled 2015-12-28 (×3): qty 125

## 2015-12-28 MED ORDER — LIDOCAINE HCL (PF) 1 % IJ SOLN
INTRAMUSCULAR | Status: DC | PRN
Start: 2015-12-28 — End: 2015-12-28
  Administered 2015-12-28 (×2): 7 mL via EPIDURAL

## 2015-12-28 MED ORDER — OXYCODONE-ACETAMINOPHEN 5-325 MG PO TABS
1.0000 | ORAL_TABLET | ORAL | Status: DC | PRN
Start: 2015-12-28 — End: 2015-12-29

## 2015-12-28 MED ORDER — LACTATED RINGERS IV SOLN
500.0000 mL | Freq: Once | INTRAVENOUS | Status: DC
Start: 2015-12-28 — End: 2015-12-29

## 2015-12-28 MED ORDER — SOD CITRATE-CITRIC ACID 500-334 MG/5ML PO SOLN
30.0000 mL | ORAL | Status: DC | PRN
  Administered 2015-12-28: 30 mL via ORAL
  Filled 2015-12-28: qty 15

## 2015-12-28 MED ORDER — MISOPROSTOL 25 MCG QUARTER TABLET
25.0000 ug | ORAL_TABLET | ORAL | Status: DC | PRN
  Administered 2015-12-28: 25 ug via VAGINAL
  Filled 2015-12-28: qty 1
  Filled 2015-12-28: qty 0.25

## 2015-12-28 MED ORDER — BUTORPHANOL TARTRATE 1 MG/ML IJ SOLN: 1.0000 mg | INTRAMUSCULAR | Status: DC | PRN

## 2015-12-28 MED ORDER — GENTAMICIN SULFATE 40 MG/ML IJ SOLN
Freq: Once | INTRAVENOUS | Status: AC
  Administered 2015-12-28: 19:00:00 via INTRAVENOUS
  Filled 2015-12-28: qty 3.75

## 2015-12-28 MED ORDER — GENTAMICIN SULFATE 40 MG/ML IJ SOLN: 2.0000 mg/kg | Freq: Once | INTRAVENOUS | Status: DC

## 2015-12-28 MED ORDER — CLINDAMYCIN PHOSPHATE 900 MG/50ML IV SOLN
900.0000 mg | Freq: Once | INTRAVENOUS | Status: AC
  Administered 2015-12-29: 900 mg via INTRAVENOUS
  Filled 2015-12-28: qty 50

## 2015-12-28 MED ORDER — LIDOCAINE HCL (PF) 1 % IJ SOLN
30.0000 mL | INTRAMUSCULAR | Status: DC | PRN
  Administered 2015-12-28: 30 mL via SUBCUTANEOUS
  Filled 2015-12-28: qty 30

## 2015-12-28 MED ORDER — PHENYLEPHRINE 40 MCG/ML (10ML) SYRINGE FOR IV PUSH (FOR BLOOD PRESSURE SUPPORT)
80.0000 ug | PREFILLED_SYRINGE | INTRAVENOUS | Status: DC | PRN
  Filled 2015-12-28: qty 10
  Filled 2015-12-28: qty 5

## 2015-12-28 MED ORDER — FLEET ENEMA 7-19 GM/118ML RE ENEM: 1.0000 | ENEMA | RECTAL | Status: DC | PRN

## 2015-12-28 MED ORDER — ONDANSETRON HCL 4 MG/2ML IJ SOLN
4.0000 mg | Freq: Four times a day (QID) | INTRAMUSCULAR | Status: DC | PRN
Start: 2015-12-28 — End: 2015-12-29
  Administered 2015-12-28 (×2): 4 mg via INTRAVENOUS
  Filled 2015-12-28 (×2): qty 2

## 2015-12-28 MED ORDER — ACETAMINOPHEN 325 MG PO TABS
650.0000 mg | ORAL_TABLET | ORAL | Status: DC | PRN
  Administered 2015-12-28: 650 mg via ORAL
  Filled 2015-12-28: qty 2

## 2015-12-28 MED ORDER — DIPHENHYDRAMINE HCL 50 MG/ML IJ SOLN: 12.5000 mg | INTRAMUSCULAR | Status: DC | PRN

## 2015-12-28 MED ORDER — OXYTOCIN 40 UNITS IN LACTATED RINGERS INFUSION - SIMPLE MED
2.5000 [IU]/h | INTRAVENOUS | Status: DC
  Filled 2015-12-28: qty 1000

## 2015-12-28 MED ORDER — LEVOTHYROXINE SODIUM 75 MCG PO TABS
75.0000 ug | ORAL_TABLET | Freq: Every day | ORAL | Status: DC
  Administered 2015-12-29 – 2015-12-30 (×2): 75 ug via ORAL
  Filled 2015-12-28 (×3): qty 1

## 2015-12-28 MED ORDER — ZOLPIDEM TARTRATE 5 MG PO TABS
5.0000 mg | ORAL_TABLET | Freq: Every evening | ORAL | Status: DC | PRN
  Administered 2015-12-28: 5 mg via ORAL
  Filled 2015-12-28: qty 1

## 2015-12-28 MED ORDER — OXYTOCIN BOLUS FROM INFUSION: 500.0000 mL | INTRAVENOUS | Status: DC

## 2015-12-28 MED ORDER — PHENYLEPHRINE 40 MCG/ML (10ML) SYRINGE FOR IV PUSH (FOR BLOOD PRESSURE SUPPORT)
80.0000 ug | PREFILLED_SYRINGE | INTRAVENOUS | Status: DC | PRN
  Filled 2015-12-28: qty 5

## 2015-12-28 MED ORDER — DEXTROSE 5 % IV SOLN: 2.0000 mg/kg | Freq: Once | INTRAVENOUS | Status: DC

## 2015-12-28 NOTE — Anesthesia Procedure Notes (Signed)
Epidural Patient location during procedure: OB Start time: 12/28/2015 6:59 AM End time: 12/28/2015 7:03 AM  Staffing Anesthesiologist: Leilani AbleHATCHETT, Sheila Ocasio Performed by: anesthesiologist   Preanesthetic Checklist Completed: patient identified, surgical consent, pre-op evaluation, timeout performed, IV checked, risks and benefits discussed and monitors and equipment checked  Epidural Patient position: sitting Prep: site prepped and draped and DuraPrep Patient monitoring: continuous pulse ox and blood pressure Approach: midline Location: L3-L4 Injection technique: LOR air  Needle:  Needle type: Tuohy  Needle gauge: 17 G Needle length: 9 cm and 9 Needle insertion depth: 5 cm cm Catheter type: closed end flexible Catheter size: 19 Gauge Catheter at skin depth: 10 cm Test dose: negative and Other  Assessment Sensory level: T9 Events: blood not aspirated, injection not painful, no injection resistance, negative IV test and no paresthesia

## 2015-12-28 NOTE — Anesthesia Pain Management Evaluation Note (Signed)
  CRNA Pain Management Visit Note  Patient: Heather [name], 30 y.o., female  "Hello I am a member of the anesthesia team at [facility]. We have an anesthesia team available at all times to provide care throughout the hospital, including epidural management and anesthesia for C-section. I don't know your plan for the delivery whether it a natural birth, water birth, IV sedation, nitrous supplementation, doula or epidural, but we want to meet your pain goals."   1.Was your pain managed to your expectations on prior hospitalizations?     2.What is your expectation for pain management during this hospitalization?       3.How can we help you reach that goal?   Record the patient's initial score and the patient's pain goal.   Pain: 0  Pain Goal: 3 [facility] wants you to be able to say your pain was always managed very well.  Laban EmperorMalinova,Heather [name] 12/28/2015

## 2015-12-28 NOTE — H&P (Signed)
Heather [name] is a 30 y.o. female presenting for post-dates IOL.  The patient received one dose of VMP overnight and has progressed well.  She is currently comfortable with epidural which was just placed. Antepartum course complicated by hyperemesis and hypothyroidism.  She has h/o migraines and anxiety/depression.  GBS negative.  Maternal Medical History:  Fetal activity: Perceived fetal activity is normal.   Last perceived fetal movement was within the past hour.    Prenatal complications: no prenatal complications Prenatal Complications - Diabetes: none.    OB History    Gravida Para Term Preterm AB TAB SAB Ectopic Multiple Living   3 0   2  2   0     Past Medical History  Diagnosis Date  . Chest pain   . Shortness of breath dyspnea     with chest pains  . Anginal pain (HCC)     cardiac workup normal  . Hypothyroidism     borderline  . Anxiety   . Depression   . GERD (gastroesophageal reflux disease)     with pregnancy  . Headache     migraines  . Miscarriage within last 12 months March 2016  . Hashimoto's disease   . Hx of varicella    Past Surgical History  Procedure Laterality Date  . Laparoscopic appendectomy N/A 01/13/2014    Procedure: APPENDECTOMY LAPAROSCOPIC;  Surgeon: Cherylynn Ridges, MD;  Location: [facility] OR;  Service: General;  Laterality: N/A;  . Tooth extraction    . Appendectomy    . Dilation and evacuation N/A 08/27/2014    Procedure: DILATATION AND EVACUATION WITH CHROMOSOMES STUDES, ULTRASOUND;  Surgeon: Harold Hedge, MD;  Location: [facility] ORS;  Service: Gynecology;  Laterality: N/A;   Family History: family history includes Cancer in her maternal grandfather and maternal grandmother; Diabetes in her mother; Hypertension in her mother; Rheum arthritis in her maternal grandfather. Social History:  reports that she has never smoked. She has never used smokeless tobacco. She reports that she does not drink alcohol or use illicit drugs.   Prenatal Transfer Tool   Maternal Diabetes: No Genetic Screening: Normal Maternal Ultrasounds/Referrals: Normal Fetal Ultrasounds or other Referrals:  None Maternal Substance Abuse:  No Significant Maternal Medications:  Meds include: Syntroid Significant Maternal Lab Results:  Lab values include: Group B Strep negative Other Comments:  None  ROS  Dilation: 4.5 Effacement (%): 80 Station: -3 Exam by:: AThersa Salt RN  Blood pressure 106/75, pulse 93, temperature 98.5 F (36.9 C), temperature source Oral, resp. rate 17, height  (1.753 m), weight 160 lb (72.576 kg), last menstrual period 03/20/2015, SpO2 99 %, currently breastfeeding. Maternal Exam:  Uterine Assessment: Contraction strength is moderate.  Contraction frequency is regular.   Abdomen: Patient reports no abdominal tenderness. Fundal height is c/w dates.   Estimated fetal weight is 7#8.       Physical Exam  Constitutional: She is oriented to person, place, and time. She appears well-developed and well-nourished.  GI: Soft. There is no rebound and no guarding.  Neurological: She is alert and oriented to person, place, and time.  Skin: Skin is warm and dry.  Psychiatric: She has a normal mood and affect. Her behavior is normal.    Prenatal labs: ABO, Rh: --/--/B POS, B POS (07/11 0100) Antibody: NEG (07/11 0100) Rubella: Immune (12/01 0000) RPR: Nonreactive (12/01 0000)  HBsAg: Negative (12/01 0000)  HIV: Non-reactive (12/01 0000)  GBS: Negative (07/10 0000)   Assessment/Plan: 29yo G3P0 at [redacted]w[redacted]d for  post-dates iol -AROM -Pitocin prn -Anticipate NSVD   Orpah Hausner 12/28/2015, 7:47 AM

## 2015-12-28 NOTE — Progress Notes (Signed)
SVE anterior lip (thinner than last exam; no edema) Pitocin 4mU IUPC placed  Tax 100.5  Will monitor MVUs and give Gent/Clinda/Tylenol.  Mitchel HonourMegan Daylin Gruszka, DO

## 2015-12-28 NOTE — Anesthesia Preprocedure Evaluation (Signed)
Anesthesia Evaluation  Patient identified by MRN, date of birth, ID band Patient awake    Reviewed: Allergy & Precautions, H&P , NPO status , Patient's Chart, lab work & pertinent test results  Airway Mallampati: I  TM Distance: >3 FB Neck ROM: full    Dental no notable dental hx.    Pulmonary    Pulmonary exam normal breath sounds clear to auscultation       Cardiovascular negative cardio ROS Normal cardiovascular exam     Neuro/Psych    GI/Hepatic Neg liver ROS, GERD  Medicated and Controlled,  Endo/Other    Renal/GU      Musculoskeletal   Abdominal Normal abdominal exam  (+)   Peds  Hematology negative hematology ROS (+)   Anesthesia Other Findings   Reproductive/Obstetrics (+) Pregnancy                             Anesthesia Physical Anesthesia Plan  ASA: II  Anesthesia Plan: Epidural   Post-op Pain Management:    Induction:   Airway Management Planned:   Additional Equipment:   Intra-op Plan:   Post-operative Plan:   Informed Consent: I have reviewed the patients History and Physical, chart, labs and discussed the procedure including the risks, benefits and alternatives for the proposed anesthesia with the patient or authorized representative who has indicated his/her understanding and acceptance.     Plan Discussed with:   Anesthesia Plan Comments:         Anesthesia Quick Evaluation

## 2015-12-29 ENCOUNTER — Encounter (HOSPITAL_COMMUNITY): Payer: Self-pay

## 2015-12-29 LAB — CBC
HEMATOCRIT: 30 % — AB (ref 36.0–46.0)
HEMOGLOBIN: 10.5 g/dL — AB (ref 12.0–15.0)
MCH: 28.5 pg (ref 26.0–34.0)
MCHC: 35 g/dL (ref 30.0–36.0)
MCV: 81.5 fL (ref 78.0–100.0)
Platelets: 191 10*3/uL (ref 150–400)
RBC: 3.68 MIL/uL — ABNORMAL LOW (ref 3.87–5.11)
RDW: 13.1 % (ref 11.5–15.5)
WBC: 23.1 10*3/uL — AB (ref 4.0–10.5)

## 2015-12-29 LAB — CCBB MATERNAL DONOR DRAW

## 2015-12-29 MED ORDER — TETANUS-DIPHTH-ACELL PERTUSSIS 5-2.5-18.5 LF-MCG/0.5 IM SUSP: 0.5000 mL | Freq: Once | INTRAMUSCULAR | Status: DC

## 2015-12-29 MED ORDER — IBUPROFEN 600 MG PO TABS
600.0000 mg | ORAL_TABLET | Freq: Four times a day (QID) | ORAL | Status: DC
  Administered 2015-12-29 – 2015-12-30 (×7): 600 mg via ORAL
  Filled 2015-12-29 (×7): qty 1

## 2015-12-29 MED ORDER — ZOLPIDEM TARTRATE 5 MG PO TABS: 5.0000 mg | ORAL_TABLET | Freq: Every evening | ORAL | Status: DC | PRN

## 2015-12-29 MED ORDER — ONDANSETRON HCL 4 MG PO TABS: 4.0000 mg | ORAL_TABLET | ORAL | Status: DC | PRN

## 2015-12-29 MED ORDER — DIPHENHYDRAMINE HCL 25 MG PO CAPS: 25.0000 mg | ORAL_CAPSULE | Freq: Four times a day (QID) | ORAL | Status: DC | PRN

## 2015-12-29 MED ORDER — SENNOSIDES-DOCUSATE SODIUM 8.6-50 MG PO TABS
2.0000 | ORAL_TABLET | ORAL | Status: DC
  Administered 2015-12-29 (×2): 2 via ORAL
  Filled 2015-12-29 (×2): qty 2

## 2015-12-29 MED ORDER — ACETAMINOPHEN 325 MG PO TABS
650.0000 mg | ORAL_TABLET | ORAL | Status: DC | PRN
  Administered 2015-12-29: 650 mg via ORAL
  Filled 2015-12-29 (×2): qty 2

## 2015-12-29 MED ORDER — WITCH HAZEL-GLYCERIN EX PADS: 1.0000 "application " | MEDICATED_PAD | CUTANEOUS | Status: DC | PRN

## 2015-12-29 MED ORDER — DIBUCAINE 1 % RE OINT: 1.0000 "application " | TOPICAL_OINTMENT | RECTAL | Status: DC | PRN

## 2015-12-29 MED ORDER — COCONUT OIL OIL
1.0000 "application " | TOPICAL_OIL | Status: DC | PRN
  Filled 2015-12-29: qty 120

## 2015-12-29 MED ORDER — BENZOCAINE-MENTHOL 20-0.5 % EX AERO
1.0000 "application " | INHALATION_SPRAY | CUTANEOUS | Status: DC | PRN
  Administered 2015-12-29: 1 via TOPICAL
  Filled 2015-12-29: qty 56

## 2015-12-29 MED ORDER — PRENATAL MULTIVITAMIN CH
1.0000 | ORAL_TABLET | Freq: Every day | ORAL | Status: DC
  Administered 2015-12-29 – 2015-12-30 (×2): 1 via ORAL
  Filled 2015-12-29 (×2): qty 1

## 2015-12-29 MED ORDER — OXYCODONE-ACETAMINOPHEN 5-325 MG PO TABS: 2.0000 | ORAL_TABLET | ORAL | Status: DC | PRN

## 2015-12-29 MED ORDER — OXYCODONE-ACETAMINOPHEN 5-325 MG PO TABS
1.0000 | ORAL_TABLET | ORAL | Status: DC | PRN
  Administered 2015-12-29 – 2015-12-30 (×3): 1 via ORAL
  Filled 2015-12-29 (×3): qty 1

## 2015-12-29 MED ORDER — SERTRALINE HCL 25 MG PO TABS
25.0000 mg | ORAL_TABLET | Freq: Every day | ORAL | Status: DC
  Administered 2015-12-29: 25 mg via ORAL
  Filled 2015-12-29 (×2): qty 1

## 2015-12-29 MED ORDER — SIMETHICONE 80 MG PO CHEW: 80.0000 mg | CHEWABLE_TABLET | ORAL | Status: DC | PRN

## 2015-12-29 MED ORDER — ONDANSETRON HCL 4 MG/2ML IJ SOLN
4.0000 mg | INTRAMUSCULAR | Status: DC | PRN
Start: 2015-12-29 — End: 2015-12-30

## 2015-12-29 NOTE — Progress Notes (Signed)
CSW acknowledged consult for hx of anxiety and depression.  CSW attempted to meet with MOB, however, MOB had several family members and friends visiting.  CSW will attempt to meet with MOB at a later time.

## 2015-12-29 NOTE — Lactation Note (Signed)
This note was copied from a baby's chart. Lactation Consultation Note  Patient Name: Girl Annitta JerseySamantha Carchi ZOXWR'UToday's Date: 12/29/2015 Reason for consult: Follow-up assessment  Baby is 7616 hours old and has been to the breast x1 right after birth  and for attempts. Baby has been spitty since birth and at this consult has been spitty x3 . LC noted the 2 out of the 3 times  Baby was just lying quietly in the crib and started spitting, old light tan blood.  LC also changed 2 mec small - medium diapers, spitting occurred before stool and when the baby was being changed. Per mom and dad feel comfortable with use of bulb syringe and are aware of the emergency light.  Bev Sinclair Groomsaly MBU RN ( caring for Dyad) and the Unit secretary for if the emergency light goes on - baby is spitty.  LC assisted to place the baby skin to skin after diaper change, baby awake, bright eyed , and seems comfortable just resting  On moms chest upright to decrease potential for spitting. No  feeding cues. LC encouraged mom and dad to call with feeding  cues on the nurses light.  Mother informed of post-discharge support and given phone number to the lactation department, including services for phone call assistance; out-patient appointments; and breastfeeding support group. List of other breastfeeding resources in the community  given in the handout. Encouraged mother to call for problems or concerns related to breastfeeding.   Maternal Data Has patient been taught Hand Expression?: Yes (per mom feels comfortable with hand express ) Does the patient have breastfeeding experience prior to this delivery?: No  Feeding    LATCH Score/Interventions                Intervention(s): Breastfeeding basics reviewed     Lactation Tools Discussed/Used     Consult Status Consult Status: Follow-up Date: 12/29/15 Follow-up type: In-patient    Kathrin Greathouseorio, Jakarius Flamenco Ann 12/29/2015, 3:46 PM

## 2015-12-29 NOTE — Progress Notes (Signed)
Post Partum Day 1 Subjective: up ad lib, voiding, tolerating PO and reports h/o anxiety and depression, has previously been on Zoloft.  Objective: Blood pressure 98/46, pulse 69, temperature 98 F (36.7 C), temperature source Oral, resp. rate 20, height 5\' 9"  (1.753 m), weight 160 lb (72.576 kg), last menstrual period 03/20/2015, SpO2 97 %, unknown if currently breastfeeding.  Physical Exam:  General: alert, cooperative and anxious Lochia: appropriate Uterine Fundus: firm, non- tender Incision: healing well, small labial edema DVT Evaluation: No evidence of DVT seen on physical exam. Negative Homan's sign. No cords or calf tenderness. No significant calf/ankle edema.   Recent Labs  12/28/15 0100 12/29/15 0617  HGB 11.1* 10.5*  HCT 31.5* 30.0*    Assessment/Plan: Plan for discharge tomorrow  CBC ordered for am.   Zoloft ordered   LOS: 1 day   Kirstina Leinweber G 12/29/2015, 8:03 AM

## 2015-12-29 NOTE — Anesthesia Postprocedure Evaluation (Signed)
Anesthesia Post Note  Patient: Heather JerseySamantha [name]  Procedure(s) Performed: * No procedures listed *  Patient location during evaluation: Mother Baby Anesthesia Type: Epidural Level of consciousness: awake and alert Pain management: pain level controlled Vital Signs Assessment: post-procedure vital signs reviewed and stable Respiratory status: spontaneous breathing, nonlabored ventilation and respiratory function stable Cardiovascular status: stable Postop Assessment: no headache, no backache and epidural receding Anesthetic complications: no     Last Vitals:  Filed Vitals:   12/29/15 0202 12/29/15 0510  BP: 110/68 98/46  Pulse: 92 69  Temp: 36.9 C 36.7 C  Resp: 16 20    Last Pain:  Filed Vitals:   12/29/15 0621  PainSc: 3    Pain Goal: Patients Stated Pain Goal: 2 (12/29/15 [id number])               Junious SilkGILBERT,[name]

## 2015-12-30 LAB — CBC
HEMATOCRIT: 30.4 % — AB (ref 36.0–46.0)
HEMOGLOBIN: 10.6 g/dL — AB (ref 12.0–15.0)
MCH: 28.8 pg (ref 26.0–34.0)
MCHC: 34.9 g/dL (ref 30.0–36.0)
MCV: 82.6 fL (ref 78.0–100.0)
Platelets: 197 10*3/uL (ref 150–400)
RBC: 3.68 MIL/uL — ABNORMAL LOW (ref 3.87–5.11)
RDW: 13.4 % (ref 11.5–15.5)
WBC: 16.6 10*3/uL — ABNORMAL HIGH (ref 4.0–10.5)

## 2015-12-30 MED ORDER — SERTRALINE HCL 50 MG PO TABS: 50.0000 mg | ORAL_TABLET | Freq: Every day | ORAL | Status: DC

## 2015-12-30 MED ORDER — IBUPROFEN 600 MG PO TABS: 600.0000 mg | ORAL_TABLET | Freq: Four times a day (QID) | ORAL | Status: DC

## 2015-12-30 MED ORDER — OXYCODONE-ACETAMINOPHEN 5-325 MG PO TABS: 1.0000 | ORAL_TABLET | ORAL | Status: DC | PRN

## 2015-12-30 MED ORDER — SERTRALINE HCL 50 MG PO TABS
50.0000 mg | ORAL_TABLET | Freq: Every day | ORAL | Status: DC
  Administered 2015-12-30: 50 mg via ORAL
  Filled 2015-12-30 (×2): qty 1

## 2015-12-30 NOTE — Clinical Social Work Maternal (Signed)
  CLINICAL SOCIAL WORK MATERNAL/CHILD NOTE  Patient Details  Name: Heather [name] MRN: [id number] Date of Birth: 1986/05/12  Date:  12/30/2015  Clinical Social Worker Initiating Note:  Laurey Arrow Date/ Time Initiated:  12/23/15/0940     Child's Name:  Heather [name]   Legal Guardian:  Mother   Need for Interpreter:  None   Date of Referral:  12/30/15     Reason for Referral:  [facility] including [facility]    Referral Source:  Central Nursery   Address:  [address] [place] [address]  Phone number:  [phone]   Household Members:  Self, Spouse   Natural Supports (not living in the home):  Parent, Extended Family, Immediate Family   Professional Supports: Therapist   Employment: Full-time   Type of Work:     Education:  Engineer, maintenance Resources:  Multimedia programmer   Other Resources:      Cultural/Religious Considerations Which May Impact Care:  None reported  Strengths:  Ability to meet basic needs , Compliance with medical plan , Home prepared for child , Pediatrician chosen , Understanding of illness   Risk Factors/Current Problems:  Mental Health Concerns    Cognitive State:  Alert , Insightful , Goal Oriented    Mood/Affect:  Bright , Calm , Interested , Happy , Relaxed    CSW Assessment: CSW met with MOB to acknowledge a consult for hx of anxiety and depression.  MOB was inviting, polite, and interested in meeting with CSW.  MOB introduced her room guest as FOB (Zack Bulthuis). MOB gave CSW permission to speak with MOB in FOB's presence. CSW inquired about MOB's supports, and MOB stated that she and FOB resides together, and MOB parentings will be visiting for the next 2 months from [place].  CSW inquired about MOB's hx of anxiety and depression, MOB acknowledged that she has "struggled" with her [place] for a while.  MOB reported that her OBGYN started her on Zoloft on yesterday (7/13), and MOB feels like that was the  right decision.  CSW educated MOB and FOB about PPD.  CSW informed MOB and FOB of possible supports and interventions to decrease PPD symptoms.  CSW also encouraged MOB to seek medical attention if needed for increased signs, symptoms for PPD. CSW offered MOB resources for outpatient MH counseling, and MOB declined.  CSW praised MOB and FOB for taking the initiative to request medication and praised MOB for having insight and awareness about her [place]. CSW thanked MOB for her honesty, and willingness to meet with CSW.  MOB did not have any further questions, concerns, or needs at this time.    CSW Plan/Description:  Patient/Family Education , No Further Intervention Required/No Barriers to Discharge, Information/Referral to [facility] D BOYD-GILYARD, LCSW 12/30/2015, 11:52 AM

## 2015-12-30 NOTE — Discharge Summary (Signed)
Obstetric Discharge Summary Reason for Admission: induction of labor Prenatal Procedures: ultrasound Intrapartum Procedures: spontaneous vaginal delivery Postpartum Procedures: antibiotics Complications-Operative and Postpartum: periurethral laceration HEMOGLOBIN  Date Value Ref Range Status  12/30/2015 10.6* 12.0 - 15.0 g/dL Final   HCT  Date Value Ref Range Status  12/30/2015 30.4* 36.0 - 46.0 % Final    Physical Exam:  General: alert and cooperative Lochia: appropriate Uterine Fundus: firm Incision: healing well DVT Evaluation: No evidence of DVT seen on physical exam. Negative Homan's sign. No cords or calf tenderness. Calf/Ankle edema is present.  Discharge Diagnoses: Term Pregnancy-delivered  Discharge Information: Date: 12/30/2015 Activity: pelvic rest Diet: routine Medications: PNV, Ibuprofen, Percocet and Synthroid and Zoloft Condition:stable Instructions: refer to practice specific booklet Discharge to: home   Newborn Data: Live born female  Birth Weight: 8 lb 11 oz (3940 g) APGAR: 7, 9  Home with mother.  Heather [name] G 12/30/2015, 8:11 AM

## 2015-12-30 NOTE — Lactation Note (Signed)
This note was copied from a baby's chart. Lactation Consultation Note  Patient Name: Heather [name] NWGNF'AToday's Date: 12/30/2015 Reason for consult: Follow-up assessment Visited with Mom and FOB on day of discharge, baby 7536 hrs old.  Mom asking for assistance with latching baby to right breast using the football hold.  Baby easily latched, with deep areolar latch.  Teaching reviewed on basics.  Importance of keeping baby skin to skin, and watching for cues to feed baby 8-12 times per 24 hrs.  Baby responding well to occasional stimulation.  Taught Mom to use alternate breast compression during feeding, and to identify baby swallowing.  Demonstrated how to assess baby's lower lip, and how to uncurl with gentle chin tug.  Dad was able to return demo this.  Mom aware of OP lactation services available to her.  Encouraged her to call prn.  Follow up with Pediatrician 01/01/16.      LATCH Score/Interventions Latch: Grasps breast easily, tongue down, lips flanged, rhythmical sucking. Intervention(s): Waking techniques;Teach feeding cues;Skin to skin  Audible Swallowing: Spontaneous and intermittent (needing occasional stimulation) Intervention(s): Hand expression;Skin to skin Intervention(s): Alternate breast massage;Hand expression;Skin to skin  Type of Nipple: Everted at rest and after stimulation  Comfort (Breast/Nipple): Soft / non-tender     Hold (Positioning): Assistance needed to correctly position infant at breast and maintain latch. Intervention(s): Breastfeeding basics reviewed;Support Pillows;Position options;Skin to skin  LATCH Score: 9  Lactation Tools Discussed/Used     Consult Status Consult Status: Complete Date: 12/30/15 Follow-up type: Call as needed    Judee ClaraSmith, Braelin Costlow E 12/30/2015, 10:53 AM

## 2016-01-14 ENCOUNTER — Ambulatory Visit (HOSPITAL_COMMUNITY)
Admission: RE | Admit: 2016-01-14 | Discharge: 2016-01-14 | Disposition: A | Discharge status: Home / Self Care | Payer: 59 | Source: Ambulatory Visit | Attending: Obstetrics and Gynecology | Admitting: Obstetrics and Gynecology

## 2016-01-14 NOTE — Lactation Note (Signed)
Lactation Consult: Weight today  9 lbs 4.5 oz with clean, dry diaper. Mom has been coming to [facility] for assist with breast feeding. Concerned that baby was crying a lot like she wasn't getting enough. Has been supplementing while trying to build milk supply.Heather [name] latched well but sleepy and non nutritive at the breast.  Baby breast fed well with feeding tube/syringe and mom could tell a difference in how deep her suck was. Mom eager to try supplementing while at the breast. Encouraged to continue pumping 6-8 times/day. Encouraged to increase [place] to 3 tablets each time- she is just taking 2 tabs. Mom complains of pain with pumping. #30 flange given and mom reports that feels much better. Mom was able to pump a little more milk that usual after nursing so feels milk supply is increasing slowing. Encouragement given, No further questions at present. Does not want to make another OP appointment at this time,. To will continue coming to [facility] for weight checks. To call prn  Mother's reason for visit:  Wants help with breast feeding Visit Type:  Feeding assessment Appointment Notes:  Not taking much from the breast Consult:  Initial Lactation Consultant:  Pamelia Hoit  ________________________________________________________________________  Baby's Name:  Heather [name] Date of Birth:  12/28/2013 Pediatrician:  Eddie Candle Gender:  female Gestational Age: [redacted]w[redacted]d (At Birth) Birth Weight:  8 lb 11 oz (3940 g) Weight at Discharge:  Weight: 8 lb 5.8 oz (3793 g)                                   Date of Discharge:  12/30/2015 [facility] Weights   12/28/15 2249 12/29/15 2330  Weight: 8 lb 11 oz (3940 g) 8 lb 5.8 oz (3793 g)     ________________________________________________________________________  Mother's Name: Heather [name] Type of delivery:  vag Breastfeeding Experience:  P1  ________________________________________________________________________  Breastfeeding History (Post  Discharge)  Frequency of breastfeeding:  q 203 hours Duration of feeding:  15-30 min  Supplementation  Formula:  Volume 60 ml Frequency:  Every feeding       Brand: Similac  Breastmilk:  Volume 45 ml Frequency:  As available Method:  Bottle,   Pumping  Type of pump:  Medela pump in style Frequency:  6 times/day Volume:  30-45 ml    Infant Intake and Output Assessment  Voids:  10  in 24 hrs.  Color:  Clear yellow Stools:  5+ in 24 hrs.  Color:  Yellow  ________________________________________________________________________  Maternal Breast Assessment  Breast:  Soft Nipple:  Erect  _______________________________________________________________________ Feeding Assessment/Evaluation  Initial feeding assessment:  Infant's oral assessment:  WNL  Positioning:  Cross cradle Left breast  LATCH documentation:  Latch:  2 = Grasps breast easily, tongue down, lips flanged, rhythmical sucking.  Audible swallowing:  0 = None  Type of nipple:  2 = Everted at rest and after stimulation  Comfort (Breast/Nipple):  2 = Soft / non-tender  Hold (Positioning):  1 = Assistance needed to correctly position infant at breast and maintain latch  LATCH score:  7  Attached assessment:  Deep  Lips flanged:  Yes.    Lips untucked:  Yes.    Suck assessment:  Nutritive and Nonnutritive  Tools:  Syringe with 5 Fr feeding tube Instructed on use and cleaning of tool:  Yes.    Pre-feed weight:  4208 g  9- 4.5 oz Post-feed weight:  4268 g 9-  6.6 oz Amount transferred:  60 ml  Total    36 ml EBM Amount supplemented:  24 ml supplemented at the breast with feeding tube/syringe  Heather [name] latched well but sleepy at the breast and mostly non nutritive. With addition of feeding tube/syringe. Much more vigorous at the breast with lots of swallows noted. Took 36 ml of EBM   Pre-feed weight:  4268 g  9- 6.6 oz Post-feed weight:  4302 g   9- 7.7 oz Amount transferred:  34 ml  Total  13 ml  EBM Amount supplemented:  21 ml  Formula with feeding tube/syringe   Total amount pumped post feed:  R 30 ml    L 30 ml  Total amount transferred:   49  ml Total supplement given:  45 ml

## 2016-04-22 ENCOUNTER — Inpatient Hospital Stay (HOSPITAL_COMMUNITY): Payer: 59

## 2016-04-22 ENCOUNTER — Encounter (HOSPITAL_COMMUNITY): Payer: Self-pay | Admitting: *Deleted

## 2016-04-22 ENCOUNTER — Inpatient Hospital Stay (HOSPITAL_COMMUNITY)
Admission: AD | Admit: 2016-04-22 | Discharge: 2016-04-22 | Disposition: A | Discharge status: Home / Self Care | Payer: 59 | Source: Ambulatory Visit | Attending: Obstetrics & Gynecology | Admitting: Obstetrics & Gynecology

## 2016-04-22 DIAGNOSIS — T8332XA Displacement of intrauterine contraceptive device, initial encounter: Secondary | ICD-10-CM

## 2016-04-22 DIAGNOSIS — G43909 Migraine, unspecified, not intractable, without status migrainosus: Secondary | ICD-10-CM | POA: Diagnosis not present

## 2016-04-22 DIAGNOSIS — F419 Anxiety disorder, unspecified: Secondary | ICD-10-CM | POA: Insufficient documentation

## 2016-04-22 DIAGNOSIS — E063 Autoimmune thyroiditis: Secondary | ICD-10-CM | POA: Insufficient documentation

## 2016-04-22 DIAGNOSIS — Z975 Presence of (intrauterine) contraceptive device: Secondary | ICD-10-CM | POA: Diagnosis not present

## 2016-04-22 DIAGNOSIS — R0602 Shortness of breath: Secondary | ICD-10-CM | POA: Insufficient documentation

## 2016-04-22 DIAGNOSIS — F329 Major depressive disorder, single episode, unspecified: Secondary | ICD-10-CM | POA: Insufficient documentation

## 2016-04-22 DIAGNOSIS — K219 Gastro-esophageal reflux disease without esophagitis: Secondary | ICD-10-CM | POA: Diagnosis not present

## 2016-04-22 DIAGNOSIS — Z30431 Encounter for routine checking of intrauterine contraceptive device: Secondary | ICD-10-CM | POA: Diagnosis present

## 2016-04-22 DIAGNOSIS — R102 Pelvic and perineal pain: Secondary | ICD-10-CM

## 2016-04-22 DIAGNOSIS — R1031 Right lower quadrant pain: Secondary | ICD-10-CM | POA: Diagnosis not present

## 2016-04-22 LAB — URINE MICROSCOPIC-ADD ON

## 2016-04-22 LAB — CBC
HCT: 40.4 % (ref 36.0–46.0)
Hemoglobin: 14.2 g/dL (ref 12.0–15.0)
MCH: 29.5 pg (ref 26.0–34.0)
MCHC: 35.1 g/dL (ref 30.0–36.0)
MCV: 83.8 fL (ref 78.0–100.0)
PLATELETS: 271 10*3/uL (ref 150–400)
RBC: 4.82 MIL/uL (ref 3.87–5.11)
RDW: 13.5 % (ref 11.5–15.5)
WBC: 4.5 10*3/uL (ref 4.0–10.5)

## 2016-04-22 LAB — URINALYSIS, ROUTINE W REFLEX MICROSCOPIC
Bilirubin Urine: NEGATIVE
Glucose, UA: NEGATIVE mg/dL
Ketones, ur: NEGATIVE mg/dL
LEUKOCYTES UA: NEGATIVE
Nitrite: NEGATIVE
Protein, ur: NEGATIVE mg/dL
pH: 5.5 (ref 5.0–8.0)

## 2016-04-22 LAB — WET PREP, GENITAL
CLUE CELLS WET PREP: NONE SEEN
Sperm: NONE SEEN
TRICH WET PREP: NONE SEEN
YEAST WET PREP: NONE SEEN

## 2016-04-22 LAB — POCT PREGNANCY, URINE: PREG TEST UR: NEGATIVE

## 2016-04-22 MED ORDER — KETOROLAC TROMETHAMINE 30 MG/ML IJ SOLN
30.0000 mg | Freq: Once | INTRAMUSCULAR | Status: AC
  Administered 2016-04-22: 30 mg via INTRAMUSCULAR
  Filled 2016-04-22: qty 1

## 2016-04-22 MED ORDER — IBUPROFEN 600 MG PO TABS: 600.0000 mg | ORAL_TABLET | Freq: Four times a day (QID) | ORAL | 0 refills | Status: DC | PRN

## 2016-04-22 NOTE — MAU Note (Signed)
Patient presents to MAU with mid abdominal cramping, worse on right side that started yesterday.  She took some pain meds that helped, but concerned that she can't find the IUD string.  Pt took HPT that possibly had "an evap line."  Wants to r/o ectopic.

## 2016-04-22 NOTE — MAU Provider Note (Signed)
History     CSN: [id number]  Arrival date and time: 04/22/16 1114   None     Chief Complaint  Patient presents with  . Abdominal Pain  . missing IUD string   Non-pregnant female here with lower abdominal cramping since last night. Pain became worse this am and she describes as sharp and more RLQ today. She took Ibuprofen last night and had some relief and was able to sleep. She is concerned because she checked for her Mirena IUD string last night and could not feel it. She was able to feel the string about 3 weeks ago. She denies fever and chills. She is also concerned about because she has been spotting the last 1-2 days. She reports regular menses last week. The IUD has been in place for 9 weeks.    Past Medical History:  Diagnosis Date  . Anginal pain (HCC)    cardiac workup normal  . Anxiety   . Chest pain   . Depression   . GERD (gastroesophageal reflux disease)    with pregnancy  . Hashimoto's disease   . Headache    migraines  . Hx of varicella   . Hypothyroidism    borderline  . Miscarriage within last 12 months March 2016  . Shortness of breath dyspnea    with chest pains    Past Surgical History:  Procedure Laterality Date  . APPENDECTOMY    . DILATION AND EVACUATION N/A 08/27/2014   Procedure: DILATATION AND EVACUATION WITH CHROMOSOMES STUDES, ULTRASOUND;  Surgeon: Harold Hedge, MD;  Location: [facility];  Service: Gynecology;  Laterality: N/A;  . LAPAROSCOPIC APPENDECTOMY N/A 01/13/2014   Procedure: APPENDECTOMY LAPAROSCOPIC;  Surgeon: Cherylynn Ridges, MD;  Location: [facility] OR;  Service: General;  Laterality: N/A;  . TOOTH EXTRACTION      Family History  Problem Relation Age of Onset  . Hypertension Mother   . Diabetes Mother   . Cancer Maternal Grandmother   . Cancer Maternal Grandfather   . Rheum arthritis Maternal Grandfather     Social History  Substance Use Topics  . Smoking status: Never Smoker  . Smokeless tobacco: Never Used  . Alcohol use No     Allergies:  Allergies  Allergen Reactions  . Penicillins Other (See Comments)    Reaction:  Unknown  Has patient had a PCN reaction causing immediate rash, facial/tongue/throat swelling, SOB or lightheadedness with hypotension: Unsure Has patient had a PCN reaction causing severe rash involving mucus membranes or skin necrosis: Unsure Has patient had a PCN reaction that required hospitalization Unsure Has patient had a PCN reaction occurring within the last 10 years: No If all of the above answers are "NO", then may proceed with Cephalosporin use.    Prescriptions Prior to Admission  Medication Sig Dispense Refill Last Dose  . ibuprofen (ADVIL,MOTRIN) 600 MG tablet Take 600 mg by mouth every 6 (six) hours as needed for mild pain or moderate pain.   04/21/2016 at 2200  . levothyroxine (SYNTHROID) 75 MCG tablet Take 75 mcg by mouth daily before breakfast.   04/21/2016 at Unknown time  . sertraline (ZOLOFT) 100 MG tablet Take 100 mg by mouth daily.   04/21/2016 at Unknown time    Review of Systems  Constitutional: Negative.   Gastrointestinal: Negative for abdominal pain, diarrhea, nausea and vomiting.  Genitourinary: Negative.    Physical Exam   Blood pressure 118/76, pulse 82, temperature 97.9 F (36.6 C), temperature source Oral, resp. rate 16, height 5\' 9"  (  1.753 m), weight 67.1 kg (148 lb), SpO2 99 %, not currently breastfeeding.  Physical Exam  Constitutional: She is oriented to person, place, and time. She appears well-developed and well-nourished. No distress.  HENT:  Head: Normocephalic and atraumatic.  Neck: Normal range of motion. Neck supple.  Cardiovascular: Normal rate.   Respiratory: Effort normal.  GI: Soft. She exhibits no distension and no mass. There is no tenderness. There is no rebound and no guarding.  Genitourinary:  Genitourinary Comments: External: no lesions Vagina: rugated, parous, scant drk bloody discharge, no IUD string visualized Uterus: non  enlarged, anteverted, non tender, no CMT Adnexae: no masses, no tenderness left, no tenderness right   Musculoskeletal: Normal range of motion.  Neurological: She is alert and oriented to person, place, and time.  Skin: Skin is warm and dry.  Psychiatric: She has a normal mood and affect.   Results for orders placed or performed during the hospital encounter of 04/22/16 (from the past 24 hour(s))  Urinalysis, Routine w reflex microscopic ([facility])     Status: Abnormal   Collection Time: 04/22/16 11:20 AM  Result Value Ref Range   Color, Urine YELLOW YELLOW   APPearance CLEAR CLEAR   Specific Gravity, Urine >1.030 (H) 1.005 - 1.030   pH 5.5 5.0 - 8.0   Glucose, UA NEGATIVE NEGATIVE mg/dL   Hgb urine dipstick LARGE (A) NEGATIVE   Bilirubin Urine NEGATIVE NEGATIVE   Ketones, ur NEGATIVE NEGATIVE mg/dL   Protein, ur NEGATIVE NEGATIVE mg/dL   Nitrite NEGATIVE NEGATIVE   Leukocytes, UA NEGATIVE NEGATIVE  Urine microscopic-add on     Status: Abnormal   Collection Time: 04/22/16 11:20 AM  Result Value Ref Range   Squamous Epithelial / LPF 0-5 (A) NONE SEEN   WBC, UA 0-5 0 - 5 WBC/hpf   RBC / HPF TOO NUMEROUS TO COUNT 0 - 5 RBC/hpf   Bacteria, UA FEW (A) NONE SEEN   Urine-Other MUCOUS PRESENT   Pregnancy, urine POC     Status: None   Collection Time: 04/22/16 11:29 AM  Result Value Ref Range   Preg Test, Ur NEGATIVE NEGATIVE  Wet prep, genital     Status: Abnormal   Collection Time: 04/22/16 11:50 AM  Result Value Ref Range   Yeast Wet Prep HPF POC NONE SEEN NONE SEEN   Trich, Wet Prep NONE SEEN NONE SEEN   Clue Cells Wet Prep HPF POC NONE SEEN NONE SEEN   WBC, Wet Prep HPF POC FEW (A) NONE SEEN   Sperm NONE SEEN   CBC     Status: None   Collection Time: 04/22/16 11:57 AM  Result Value Ref Range   WBC 4.5 4.0 - 10.5 K/uL   RBC 4.82 3.87 - 5.11 MIL/uL   Hemoglobin 14.2 12.0 - 15.0 g/dL   HCT 16.140.4 09.636.0 - 04.546.0 %   MCV 83.8 78.0 - 100.0 fL   MCH 29.5 26.0 - 34.0 pg   MCHC  35.1 30.0 - 36.0 g/dL   RDW 40.913.5 81.111.5 - 91.415.5 %   Platelets 271 150 - 400 K/uL   [place] Transvaginal Non-ob  Result Date: 04/22/2016 CLINICAL DATA:  Ultrasound was provided for use by the ordering physician, and a technical charge was applied by the performing facility.  No radiologist interpretation/professional services rendered.   [place] Pelvis Complete  Result Date: 04/22/2016 CLINICAL DATA:  Pelvic cramping since 04/21/2016. Right mid abdominal pain since this morning. EXAM: TRANSABDOMINAL AND TRANSVAGINAL ULTRASOUND OF PELVIS TECHNIQUE: Both  transabdominal and transvaginal ultrasound examinations of the pelvis were performed. Transabdominal technique was performed for global imaging of the pelvis including uterus, ovaries, adnexal regions, and pelvic cul-de-sac. It was necessary to proceed with endovaginal exam following the transabdominal exam to visualize the endometrium and ovaries to better advantage. COMPARISON:  None FINDINGS: Uterus Measurements: 8.3 x 4.4 x 5.7 cm. No fibroids or other mass visualized. Endometrium Thickness: 5 mm. IUD well-positioned within the endometrium. No endometrial mass or fluid. Right ovary Measurements: 3.8 x 2.4 x 2.6 cm. Normal appearance/no adnexal mass. Left ovary Measurements: 2.9 x 2.8 x 2.1 cm. Normal appearance/no adnexal mass. Other findings No abnormal free fluid. IMPRESSION: 1. Normal transabdominal and endovaginal pelvic ultrasound. 2. IUD is well positioned. Electronically Signed   By: Amie Portlandavid  Ormond M.D.   On: 04/22/2016 12:48    MAU Course  Procedures Toradol 30 mg IM x1  MDM Labs and US ordered and reviewed. No evidence of acute abdominal or pelvic process. Pain improved after Toradol. Presentation, clinical findings, and plan discussed with Dr. Langston MaskerMorris. Stable for discharge home.  Assessment and Plan   1. Abdominal pain, RLQ   2. Pelvic pain   3. IUD strings lost   4. Intrauterine contraceptive device threads lost, initial encounter     Discharge home  Follow up with [facility] as needed Return for worsening sx    Medication List    TAKE these medications   ibuprofen 600 MG tablet Commonly known as:  ADVIL,MOTRIN Take 1 tablet (600 mg total) by mouth every 6 (six) hours as needed for mild pain or moderate pain.   sertraline 100 MG tablet Commonly known as:  ZOLOFT Take 100 mg by mouth daily.   SYNTHROID 75 MCG tablet Generic drug:  levothyroxine Take 75 mcg by mouth daily before breakfast.       Donette LarryMelanie Hakop Humbarger, CNM 04/22/2016, 11:40 AM

## 2016-04-24 LAB — GC/CHLAMYDIA PROBE AMP (~~LOC~~) NOT AT ARMC
CHLAMYDIA, DNA PROBE: NEGATIVE
NEISSERIA GONORRHEA: NEGATIVE

## 2017-03-20 IMAGING — US US RENAL
1 series · 15 of 25 positions shown · non-contrast
Comparison: CT of the abdomen and pelvis from 01/12/2014

CLINICAL DATA: Acute onset of left flank pain and microscopic
hematuria. Initial encounter.

EXAM:
RENAL / URINARY TRACT ULTRASOUND COMPLETE

[Series 1: us renal · 15 of 29 slices shown]
[im 1/29]
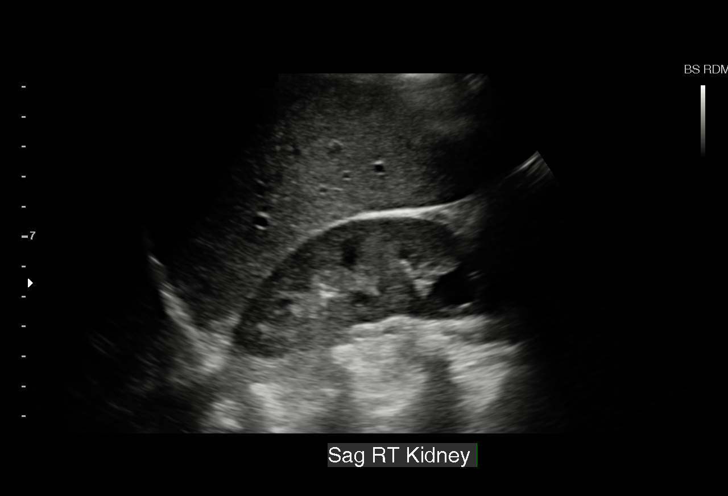
[im 3/29]
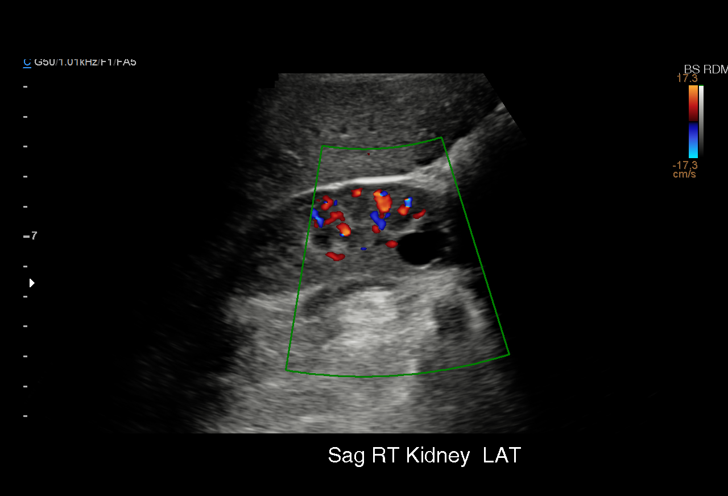
[im 5/29]
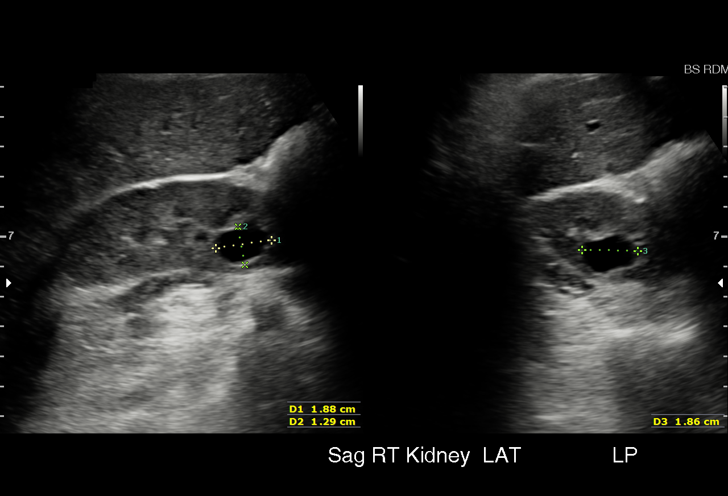
[im 6/29]
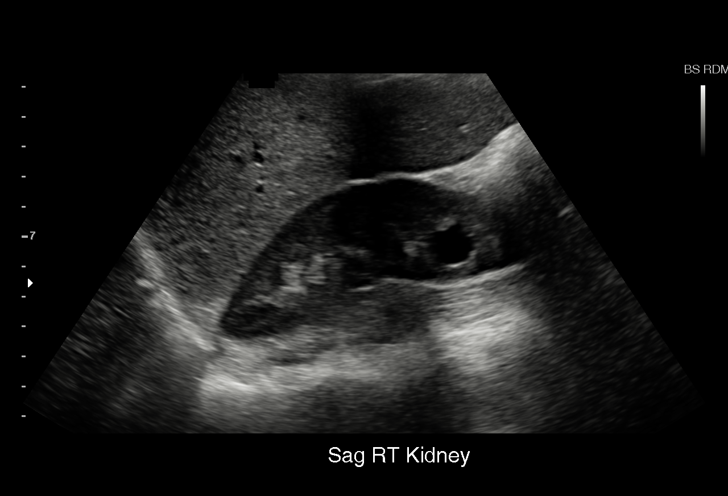
[im 9/29]
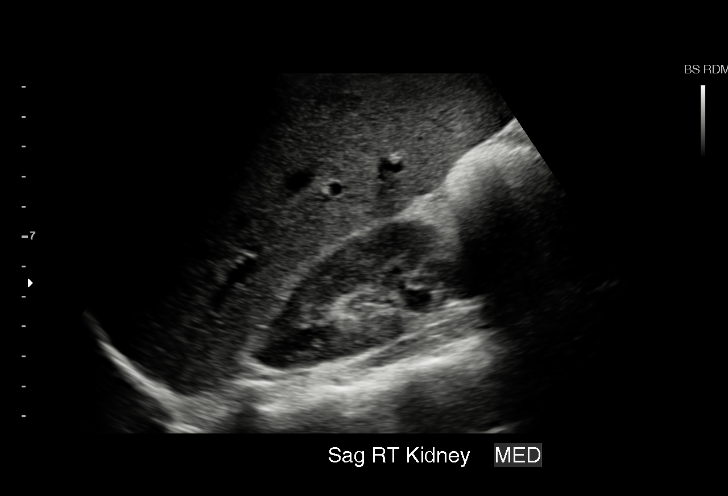
[im 11/29]
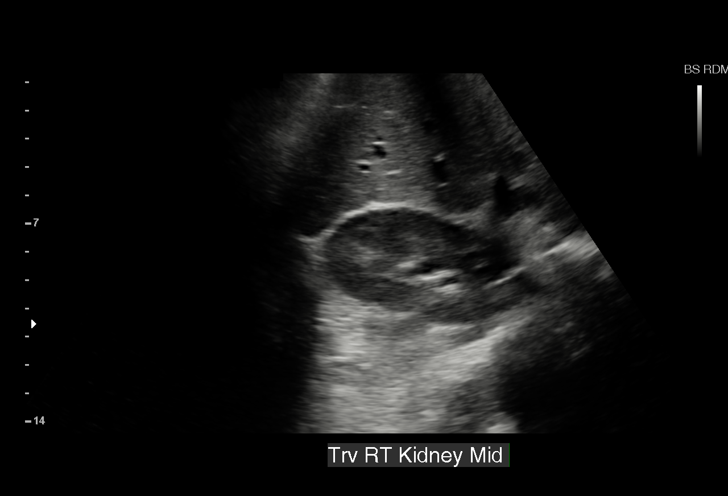
[im 12/29]
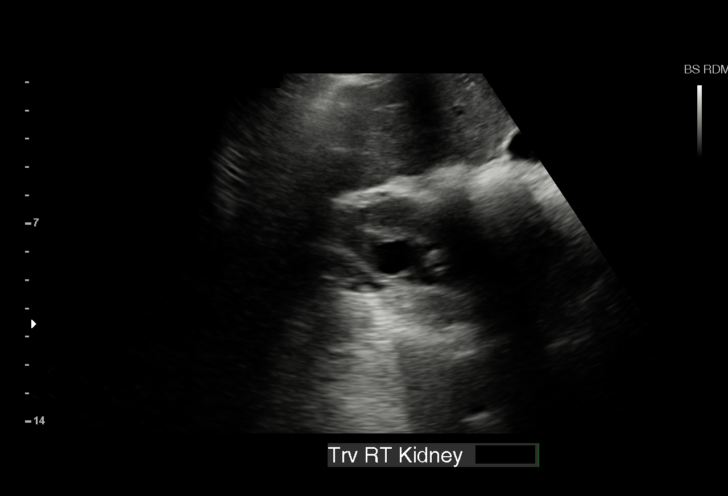
[im 15/29]
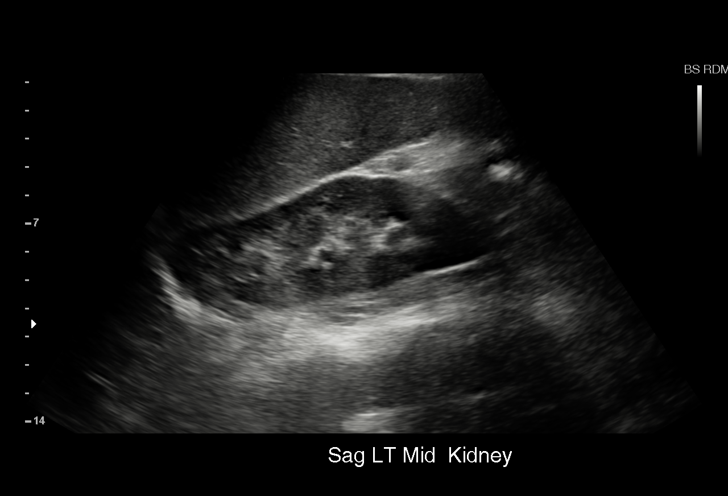
[im 17/29]
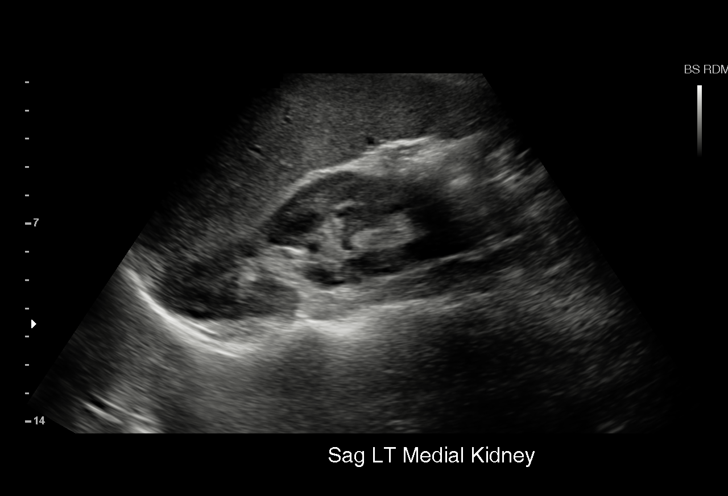
[im 18/29]
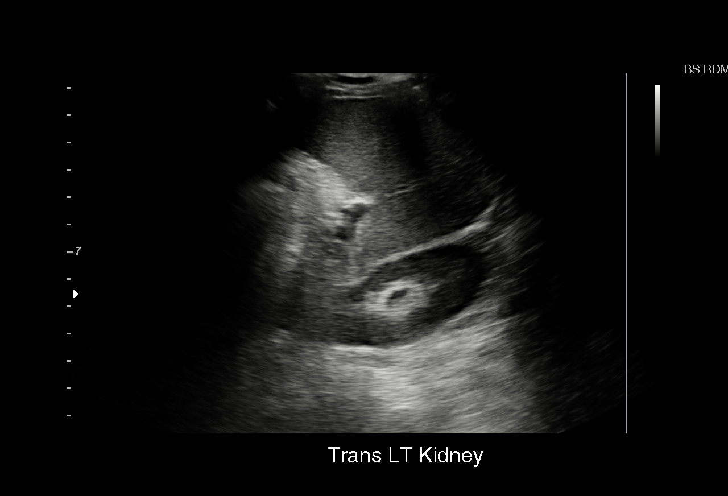
[im 20/29]
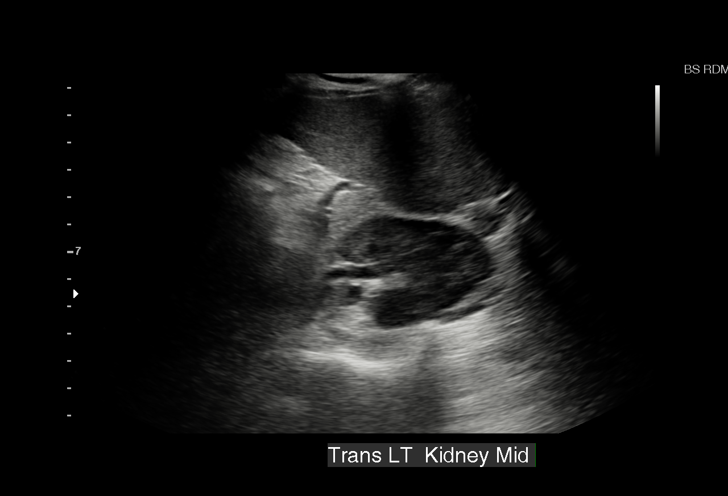
[im 23/29]
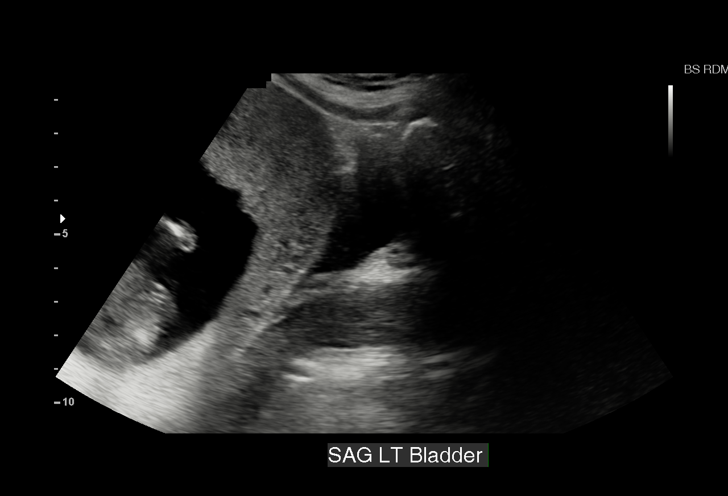
[im 24/29]
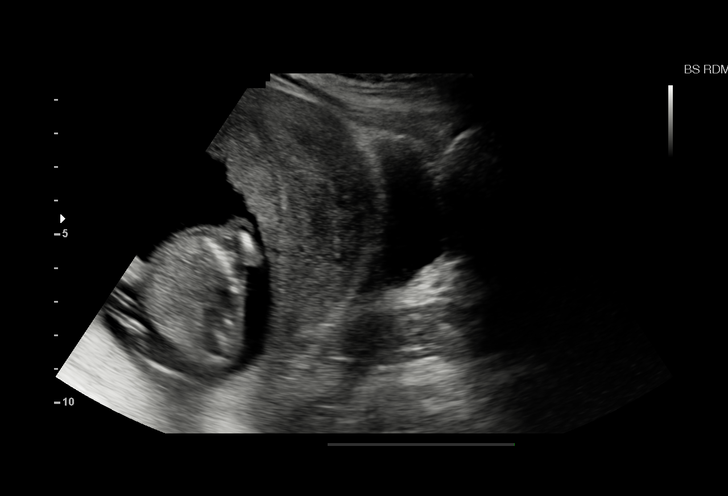
[im 26/29]
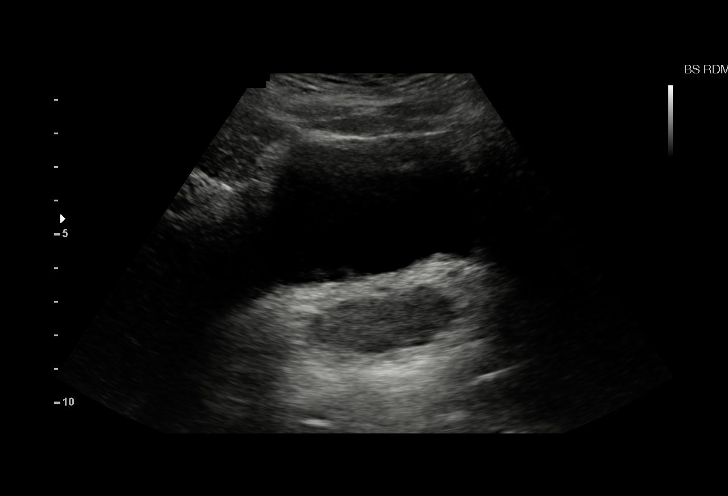
[im 29/29]
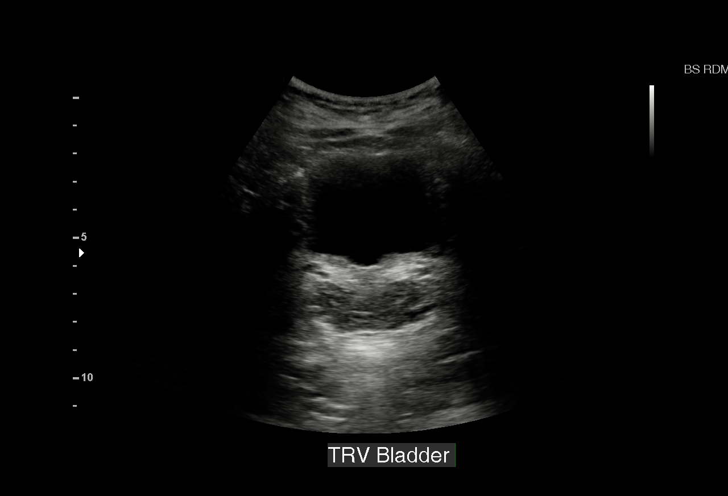

[15 of 25 positions shown; findings below may reference images not displayed]

FINDINGS: Right Kidney:

Length: 10.6 cm. Echogenicity within normal limits. A 1.9 cm simple
cyst is again noted at the lower pole of the right kidney. No
hydronephrosis visualized.

Left Kidney:

Length: 12.0 cm. Echogenicity within normal limits. Slight fullness
of the left upper pole calyx remains within normal limits. No mass
or hydronephrosis visualized.

Bladder:

Appears normal for degree of bladder distention.
IMPRESSION: Unremarkable renal ultrasound. No evidence of hydronephrosis. Small
right renal cyst again noted.

## 2017-04-09 ENCOUNTER — Inpatient Hospital Stay (HOSPITAL_COMMUNITY)
Admission: AD | Admit: 2017-04-09 | Discharge: 2017-04-09 | Disposition: A | Discharge status: Home / Self Care | Payer: 59 | Source: Ambulatory Visit | Attending: Obstetrics and Gynecology | Admitting: Obstetrics and Gynecology

## 2017-04-09 ENCOUNTER — Encounter (HOSPITAL_COMMUNITY): Payer: Self-pay | Admitting: *Deleted

## 2017-04-09 DIAGNOSIS — R102 Pelvic and perineal pain: Secondary | ICD-10-CM | POA: Insufficient documentation

## 2017-04-09 DIAGNOSIS — B373 Candidiasis of vulva and vagina: Secondary | ICD-10-CM | POA: Diagnosis not present

## 2017-04-09 DIAGNOSIS — B3731 Acute candidiasis of vulva and vagina: Secondary | ICD-10-CM

## 2017-04-09 DIAGNOSIS — Z3202 Encounter for pregnancy test, result negative: Secondary | ICD-10-CM | POA: Insufficient documentation

## 2017-04-09 DIAGNOSIS — R109 Unspecified abdominal pain: Secondary | ICD-10-CM | POA: Diagnosis present

## 2017-04-09 DIAGNOSIS — Z880 Allergy status to penicillin: Secondary | ICD-10-CM | POA: Diagnosis not present

## 2017-04-09 HISTORY — DX: Personal history of other diseases of the female genital tract: Z87.42

## 2017-04-09 HISTORY — DX: Chronic kidney disease, unspecified: N18.9

## 2017-04-09 LAB — URINALYSIS, ROUTINE W REFLEX MICROSCOPIC
BILIRUBIN URINE: NEGATIVE
Glucose, UA: NEGATIVE mg/dL
HGB URINE DIPSTICK: NEGATIVE
KETONES UR: NEGATIVE mg/dL
Nitrite: NEGATIVE
PROTEIN: NEGATIVE mg/dL
Specific Gravity, Urine: 1.021 (ref 1.005–1.030)
pH: 5 (ref 5.0–8.0)

## 2017-04-09 LAB — COMPREHENSIVE METABOLIC PANEL
ALK PHOS: 51 U/L (ref 38–126)
ALT: 11 U/L — AB (ref 14–54)
AST: 21 U/L (ref 15–41)
Albumin: 4.1 g/dL (ref 3.5–5.0)
Anion gap: 6 (ref 5–15)
BILIRUBIN TOTAL: 1 mg/dL (ref 0.3–1.2)
BUN: 14 mg/dL (ref 6–20)
CALCIUM: 8.9 mg/dL (ref 8.9–10.3)
CO2: 26 mmol/L (ref 22–32)
CREATININE: 0.68 mg/dL (ref 0.44–1.00)
Chloride: 107 mmol/L (ref 101–111)
Glucose, Bld: 125 mg/dL — ABNORMAL HIGH (ref 65–99)
Potassium: 4 mmol/L (ref 3.5–5.1)
Sodium: 139 mmol/L (ref 135–145)
Total Protein: 6.7 g/dL (ref 6.5–8.1)

## 2017-04-09 LAB — WET PREP, GENITAL
CLUE CELLS WET PREP: NONE SEEN
Sperm: NONE SEEN
Trich, Wet Prep: NONE SEEN

## 2017-04-09 LAB — CBC
HEMATOCRIT: 38.9 % (ref 36.0–46.0)
HEMOGLOBIN: 13.6 g/dL (ref 12.0–15.0)
MCH: 29.7 pg (ref 26.0–34.0)
MCHC: 35 g/dL (ref 30.0–36.0)
MCV: 84.9 fL (ref 78.0–100.0)
Platelets: 246 10*3/uL (ref 150–400)
RBC: 4.58 MIL/uL (ref 3.87–5.11)
RDW: 13.4 % (ref 11.5–15.5)
WBC: 6.5 10*3/uL (ref 4.0–10.5)

## 2017-04-09 LAB — POCT PREGNANCY, URINE: PREG TEST UR: NEGATIVE

## 2017-04-09 MED ORDER — TRAMADOL HCL 50 MG PO TABS: 50.0000 mg | ORAL_TABLET | Freq: Four times a day (QID) | ORAL | 0 refills | Status: DC | PRN

## 2017-04-09 MED ORDER — FLUCONAZOLE 150 MG PO TABS: 150.0000 mg | ORAL_TABLET | Freq: Every day | ORAL | 0 refills | Status: DC

## 2017-04-09 MED ORDER — KETOROLAC TROMETHAMINE 60 MG/2ML IM SOLN
60.0000 mg | INTRAMUSCULAR | Status: AC
  Administered 2017-04-09: 60 mg via INTRAMUSCULAR
  Filled 2017-04-09: qty 2

## 2017-04-09 NOTE — MAU Provider Note (Signed)
History     CSN: [id number]  Arrival date and time: 04/09/17 1203   First Provider Initiated Contact with Patient 04/09/17 1247      Chief Complaint  Patient presents with  . Abdominal Pain   HPI  [name] is a 31 y.o. [id number] non-pregnant female presenting to MAU with complaints   Past Medical History:  Diagnosis Date  . Anginal pain (HCC)    cardiac workup normal  . Anxiety   . Chest pain   . Chronic kidney disease    hx kidney stone  . Depression   . GERD (gastroesophageal reflux disease)    with pregnancy  . Hashimoto's disease   . Headache    migraines  . History of ovarian cyst    ruptured  . Hx of varicella   . Hypothyroidism    borderline  . Miscarriage within last 12 months March 2016  . Shortness of breath dyspnea    with chest pains    Past Surgical History:  Procedure Laterality Date  . APPENDECTOMY    . DILATION AND EVACUATION N/A 08/27/2014   Procedure: DILATATION AND EVACUATION WITH CHROMOSOMES STUDES, ULTRASOUND;  Surgeon: Harold HedgeJames Tomblin, MD;  Location: [facility];  Service: Gynecology;  Laterality: N/A;  . LAPAROSCOPIC APPENDECTOMY N/A 01/13/2014   Procedure: APPENDECTOMY LAPAROSCOPIC;  Surgeon: Cherylynn RidgesJames O Wyatt, MD;  Location: [facility];  Service: General;  Laterality: N/A;  . TOOTH EXTRACTION      Family History  Problem Relation Age of Onset  . Hypertension Mother   . Diabetes Mother   . Cancer Maternal Grandmother   . Cancer Maternal Grandfather   . Rheum arthritis Maternal Grandfather     Social History  Substance Use Topics  . Smoking status: Never Smoker  . Smokeless tobacco: Never Used  . Alcohol use Yes     Comment: occasional    Allergies:  Allergies  Allergen Reactions  . Penicillins Other (See Comments)    Reaction:  Unknown  Has patient had a PCN reaction causing immediate rash, facial/tongue/throat swelling, SOB or lightheadedness with hypotension: Unsure Has patient had a PCN reaction causing severe rash involving  mucus membranes or skin necrosis: Unsure Has patient had a PCN reaction that required hospitalization Unsure Has patient had a PCN reaction occurring within the last 10 years: No If all of the above answers are "NO", then may proceed with Cephalosporin use.    Prescriptions Prior to Admission  Medication Sig Dispense Refill Last Dose  . ibuprofen (ADVIL,MOTRIN) 600 MG tablet Take 1 tablet (600 mg total) by mouth every 6 (six) hours as needed for mild pain or moderate pain. 30 tablet 0   . levothyroxine (SYNTHROID) 75 MCG tablet Take 75 mcg by mouth daily before breakfast.   04/21/2016 at Unknown time  . sertraline (ZOLOFT) 100 MG tablet Take 100 mg by mouth daily.   04/21/2016 at Unknown time    Review of Systems Physical Exam   Blood pressure 111/72, pulse 95, temperature 97.6 F (36.4 C), temperature source Oral, resp. rate 18, SpO2 100 %, not currently breastfeeding.  Physical Exam  MAU Course  Procedures  MDM CCUA UPT CBC CMP Wet Prep GC/CT Toradol 60 mg IM injection -- minimal relief of pain  TC to Dr. Elon SpannerLeger at 1415 -- in surgery, will call back  Results for orders placed or performed during the hospital encounter of 04/09/17 (from the past 24 hour(s))  Urinalysis, Routine w reflex microscopic     Status: Abnormal  Collection Time: 04/09/17 12:10 PM  Result Value Ref Range   Color, Urine YELLOW YELLOW   APPearance HAZY (A) CLEAR   Specific Gravity, Urine 1.021 1.005 - 1.030   pH 5.0 5.0 - 8.0   Glucose, UA NEGATIVE NEGATIVE mg/dL   Hgb urine dipstick NEGATIVE NEGATIVE   Bilirubin Urine NEGATIVE NEGATIVE   Ketones, ur NEGATIVE NEGATIVE mg/dL   Protein, ur NEGATIVE NEGATIVE mg/dL   Nitrite NEGATIVE NEGATIVE   Leukocytes, UA SMALL (A) NEGATIVE   RBC / HPF 6-30 0 - 5 RBC/hpf   WBC, UA 6-30 0 - 5 WBC/hpf   Bacteria, UA RARE (A) NONE SEEN   Squamous Epithelial / LPF 6-30 (A) NONE SEEN   Mucus PRESENT   Pregnancy, urine POC     Status: None   Collection Time:  04/09/17 12:27 PM  Result Value Ref Range   Preg Test, Ur NEGATIVE NEGATIVE    Assessment and Plan  Pelvic pain - Rx for Ultram 50 mg po every 6 hrs prn pain - Reassurance there is no emergent reason to order pelvic U/S at this time - Encouraged to call MD office to scheduled F/U appt, if no improvement of pain in 1 wk - Information provided on pelvic pain and Ultram  Candida vaginitis - Rx for fluconazole 150 mg PO x 1 dose given per pt request d/t "bad reaction to the cream" - Information provided on yeast infection and Fluconazole  Discharge home Patient verbalized an understanding of the plan of care and agrees.   Raelyn Mora, MSN, CNM 04/09/2017, 12:47 PM

## 2017-04-09 NOTE — MAU Note (Signed)
Had some stabbing pains in LLQ last night.  This morning at work, started having dull pain in LLQ, is getting worse. No bleeding. Has Mirena.  No GI  Complaints.

## 2017-04-10 LAB — GC/CHLAMYDIA PROBE AMP (~~LOC~~) NOT AT ARMC
Chlamydia: NEGATIVE
Neisseria Gonorrhea: NEGATIVE

## 2017-04-10 LAB — HIV ANTIBODY (ROUTINE TESTING W REFLEX): HIV Screen 4th Generation wRfx: NONREACTIVE

## 2017-11-20 ENCOUNTER — Other Ambulatory Visit: Payer: Self-pay

## 2017-11-20 ENCOUNTER — Inpatient Hospital Stay (HOSPITAL_COMMUNITY)
Admission: AD | Admit: 2017-11-20 | Discharge: 2017-11-20 | Disposition: A | Discharge status: Home / Self Care | Payer: 59 | Source: Ambulatory Visit | Attending: Obstetrics and Gynecology | Admitting: Obstetrics and Gynecology

## 2017-11-20 ENCOUNTER — Encounter (HOSPITAL_COMMUNITY): Payer: Self-pay | Admitting: *Deleted

## 2017-11-20 DIAGNOSIS — E039 Hypothyroidism, unspecified: Secondary | ICD-10-CM | POA: Diagnosis not present

## 2017-11-20 DIAGNOSIS — R109 Unspecified abdominal pain: Secondary | ICD-10-CM | POA: Diagnosis present

## 2017-11-20 DIAGNOSIS — Z809 Family history of malignant neoplasm, unspecified: Secondary | ICD-10-CM | POA: Diagnosis not present

## 2017-11-20 DIAGNOSIS — Z888 Allergy status to other drugs, medicaments and biological substances status: Secondary | ICD-10-CM | POA: Insufficient documentation

## 2017-11-20 DIAGNOSIS — N189 Chronic kidney disease, unspecified: Secondary | ICD-10-CM | POA: Insufficient documentation

## 2017-11-20 DIAGNOSIS — N390 Urinary tract infection, site not specified: Secondary | ICD-10-CM | POA: Insufficient documentation

## 2017-11-20 DIAGNOSIS — Z8261 Family history of arthritis: Secondary | ICD-10-CM | POA: Insufficient documentation

## 2017-11-20 DIAGNOSIS — N83201 Unspecified ovarian cyst, right side: Secondary | ICD-10-CM | POA: Insufficient documentation

## 2017-11-20 DIAGNOSIS — Z792 Long term (current) use of antibiotics: Secondary | ICD-10-CM | POA: Diagnosis not present

## 2017-11-20 DIAGNOSIS — Z833 Family history of diabetes mellitus: Secondary | ICD-10-CM | POA: Insufficient documentation

## 2017-11-20 DIAGNOSIS — K219 Gastro-esophageal reflux disease without esophagitis: Secondary | ICD-10-CM | POA: Diagnosis not present

## 2017-11-20 DIAGNOSIS — Z880 Allergy status to penicillin: Secondary | ICD-10-CM | POA: Insufficient documentation

## 2017-11-20 DIAGNOSIS — Z8249 Family history of ischemic heart disease and other diseases of the circulatory system: Secondary | ICD-10-CM | POA: Insufficient documentation

## 2017-11-20 DIAGNOSIS — Z79890 Hormone replacement therapy (postmenopausal): Secondary | ICD-10-CM | POA: Insufficient documentation

## 2017-11-20 DIAGNOSIS — Z9889 Other specified postprocedural states: Secondary | ICD-10-CM | POA: Insufficient documentation

## 2017-11-20 DIAGNOSIS — R102 Pelvic and perineal pain unspecified side: Secondary | ICD-10-CM | POA: Diagnosis present

## 2017-11-20 DIAGNOSIS — Z79899 Other long term (current) drug therapy: Secondary | ICD-10-CM | POA: Insufficient documentation

## 2017-11-20 DIAGNOSIS — B373 Candidiasis of vulva and vagina: Secondary | ICD-10-CM | POA: Diagnosis not present

## 2017-11-20 DIAGNOSIS — Z79891 Long term (current) use of opiate analgesic: Secondary | ICD-10-CM | POA: Diagnosis not present

## 2017-11-20 DIAGNOSIS — B3731 Acute candidiasis of vulva and vagina: Secondary | ICD-10-CM | POA: Diagnosis present

## 2017-11-20 HISTORY — DX: Chlamydial infection, unspecified: A74.9

## 2017-11-20 LAB — WET PREP, GENITAL
Clue Cells Wet Prep HPF POC: NONE SEEN
Sperm: NONE SEEN
Trich, Wet Prep: NONE SEEN
WBC, Wet Prep HPF POC: NONE SEEN

## 2017-11-20 LAB — URINALYSIS, ROUTINE W REFLEX MICROSCOPIC
BILIRUBIN URINE: NEGATIVE
Glucose, UA: NEGATIVE mg/dL
Hgb urine dipstick: NEGATIVE
KETONES UR: NEGATIVE mg/dL
Leukocytes, UA: NEGATIVE
NITRITE: NEGATIVE
PROTEIN: NEGATIVE mg/dL
SPECIFIC GRAVITY, URINE: 1.018 (ref 1.005–1.030)
pH: 8 (ref 5.0–8.0)

## 2017-11-20 LAB — POCT PREGNANCY, URINE: PREG TEST UR: NEGATIVE

## 2017-11-20 MED ORDER — TRAMADOL HCL 50 MG PO TABS: 50.0000 mg | ORAL_TABLET | Freq: Four times a day (QID) | ORAL | 0 refills | Status: AC | PRN

## 2017-11-20 MED ORDER — KETOROLAC TROMETHAMINE 60 MG/2ML IM SOLN
60.0000 mg | INTRAMUSCULAR | Status: AC
  Administered 2017-11-20: 60 mg via INTRAMUSCULAR
  Filled 2017-11-20: qty 2

## 2017-11-20 MED ORDER — FLUCONAZOLE 150 MG PO TABS: 150.0000 mg | ORAL_TABLET | Freq: Every day | ORAL | 0 refills | Status: DC

## 2017-11-20 NOTE — Discharge Instructions (Signed)
In 2020, the [facility] will be moving to the [place] campus. At that time, the MAU (Maternity Admissions Unit), where you are being seen today, will no longer take care of non-pregnant patients. We strongly encourage you to find a doctor's office before that time, so that you can be seen with any GYN concerns, like vaginal discharge, urinary tract infection, etc.. in a timely manner. ° °In order to make an office visit more convenient, the [facility] will be offering evening hours with same-day appointments, walk-in appointments and scheduled appointments available during this time. ° °[facility] Hours: °Monday - 8am - 7:30 pm with walk-in between 4pm- 7:30 pm °Tuesday - 8 am - 5 pm (starting 09/18/17 we will be open late and accepting walk-ins from 4pm - 7:30pm) °Wednesday - 8 am - 5 pm (starting 12/19/17 we will be open late and accepting walk-ins from 4pm - 7:30pm) °Thursday 8 am - 5 pm (starting 03/21/18 we will be open late and accepting walk-ins from 4pm - 7:30pm) °Friday 8 am - 5 pm ° °For an appointment please call the [facility] at [phone] ° °For urgent needs, [facility] is also available for management of urgent GYN complaints such as vaginal discharge or urinary tract infections. ° ° ° ° ° °

## 2017-11-20 NOTE — MAU Note (Addendum)
This is about the 3rd day, having intense in RLQ.  Thinks she ovulated a few days ago.  Hx of ruptured ovarian cyst.  Mirena removed a month ago.  Neg HPT.  Pain is getting worse, has her worried.

## 2017-11-20 NOTE — MAU Provider Note (Signed)
History     CSN: [id number]  Arrival date and time: 11/20/17 7829   First Provider Initiated Contact with Patient 11/20/17 1005      Chief Complaint  Patient presents with  . Abdominal Pain   HPI  Ms.  Caya Soberanis is a 32 y.o. year old G29P1021 non-pregnant female who presents to MAU reporting RLQ pain x 3 days. She has been taking Ultram for pain relief, but the pain is worse today. She called [facility] this morning, but did npt hear back from them, so she came here for evaluation. She had her IUD removed 1 month ago, because she and her spouse are TTC. She is concerned that if she were pregnant having this type of pain. She reports (-) HPT. She reports her LMP as 11/05/2017.  Past Medical History:  Diagnosis Date  . Anginal pain (HCC)    cardiac workup normal  . Anxiety   . Chest pain   . Chlamydia    in college  . Chronic kidney disease    hx kidney stone  . Depression    on meds doing ok  . GERD (gastroesophageal reflux disease)    with pregnancy  . Hashimoto's disease   . Headache    migraines  . History of ovarian cyst    ruptured  . Hx of varicella   . Hypothyroidism    borderline  . Infection    UTI  . Miscarriage within last 12 months March 2016  . Ovarian cyst   . Shortness of breath dyspnea    with chest pains    Past Surgical History:  Procedure Laterality Date  . APPENDECTOMY    . DILATION AND EVACUATION N/A 08/27/2014   Procedure: DILATATION AND EVACUATION WITH CHROMOSOMES STUDES, ULTRASOUND;  Surgeon: Harold Hedge, MD;  Location: [facility];  Service: Gynecology;  Laterality: N/A;  . LAPAROSCOPIC APPENDECTOMY N/A 01/13/2014   Procedure: APPENDECTOMY LAPAROSCOPIC;  Surgeon: Cherylynn Ridges, MD;  Location: [facility];  Service: General;  Laterality: N/A;  . TOOTH EXTRACTION      Family History  Problem Relation Age of Onset  . Hypertension Mother   . Diabetes Mother   . Cancer Maternal Grandmother   . Cancer Maternal Grandfather   . Rheum arthritis Maternal  Grandfather     Social History   Tobacco Use  . Smoking status: Never Smoker  . Smokeless tobacco: Never Used  Substance Use Topics  . Alcohol use: Yes    Comment: occasional  . Drug use: No    Allergies:  Allergies  Allergen Reactions  . Penicillins Other (See Comments)    Reaction:  Unknown  Has patient had a PCN reaction causing immediate rash, facial/tongue/throat swelling, SOB or lightheadedness with hypotension: Unsure Has patient had a PCN reaction causing severe rash involving mucus membranes or skin necrosis: Unsure Has patient had a PCN reaction that required hospitalization Unsure Has patient had a PCN reaction occurring within the last 10 years: No If all of the above answers are "NO", then may proceed with Cephalosporin use.  . Monistat 1 [Tioconazole] Rash and Other (See Comments)    Yeast infection cream caused pain.    Medications Prior to Admission  Medication Sig Dispense Refill Last Dose  . fluconazole (DIFLUCAN) 150 MG tablet Take 1 tablet (150 mg total) by mouth daily. 1 tablet 0   . levothyroxine (SYNTHROID, LEVOTHROID) 137 MCG tablet Take 137 mcg by mouth daily before breakfast.   04/08/2017 at Unknown time  .  sertraline (ZOLOFT) 100 MG tablet Take 150 mg by mouth daily.    04/08/2017 at Unknown time  . traMADol (ULTRAM) 50 MG tablet Take 1 tablet (50 mg total) by mouth every 6 (six) hours as needed. 20 tablet 0     Review of Systems  Constitutional: Negative.   HENT: Negative.   Eyes: Negative.   Respiratory: Negative.   Cardiovascular: Negative.   Gastrointestinal: Negative.   Endocrine: Negative.   Genitourinary: Positive for pelvic pain (RT lower).  Musculoskeletal: Negative.   Skin: Negative.   Allergic/Immunologic: Negative.   Neurological: Negative.   Hematological: Negative.   Psychiatric/Behavioral: Negative.    Physical Exam   Blood pressure 119/79, pulse 88, temperature 98.5 F (36.9 C), temperature source Oral, resp. rate 18,  height 5\' 9"  (1.753 m), weight 163 lb 4 oz (74 kg), last menstrual period 11/05/2017, SpO2 100 %.  Physical Exam  Constitutional: She is oriented to person, place, and time.  Respiratory: Effort normal and breath sounds normal.  GI: Soft. Bowel sounds are normal.  Genitourinary:  Genitourinary Comments: Uterus: non-tender, SE: cervix is smooth, pink, no lesions, small amt of thick, white vaginal d/c -- WP, GC/CT done, closed/long/firm, no CMT or friability, mild RT adnexal tenderness   Neurological: She is alert and oriented to person, place, and time.  Skin: Skin is warm and dry.  Psychiatric: She has a normal mood and affect. Her behavior is normal. Judgment and thought content normal.    MAU Course  Procedures  MDM CCUA UPT Wet Prep GC/CT -- pending Toradol 60 mg IM injection -- improved pain from 7/10 to 4/10  *Consult with Dr. Henderson Cloudomblin @ 1130 - unable to talk will call back * TC to Dr. Henderson Cloudomblin @ 1214 - notified of patient's complaints, assessments, lab results, tx plan Rx Diflucan 150 mg and Ultram 50 mg, f/U with Dr. Renaldo FiddlerAdkins for pelvic U/S - ok to d/c home, agrees with plan  Results for orders placed or performed during the hospital encounter of 11/20/17 (from the past 24 hour(s))  Urinalysis, Routine w reflex microscopic     Status: Abnormal   Collection Time: 11/20/17  9:35 AM  Result Value Ref Range   Color, Urine YELLOW YELLOW   APPearance HAZY (A) CLEAR   Specific Gravity, Urine 1.018 1.005 - 1.030   pH 8.0 5.0 - 8.0   Glucose, UA NEGATIVE NEGATIVE mg/dL   Hgb urine dipstick NEGATIVE NEGATIVE   Bilirubin Urine NEGATIVE NEGATIVE   Ketones, ur NEGATIVE NEGATIVE mg/dL   Protein, ur NEGATIVE NEGATIVE mg/dL   Nitrite NEGATIVE NEGATIVE   Leukocytes, UA NEGATIVE NEGATIVE  Pregnancy, urine POC     Status: None   Collection Time: 11/20/17  9:46 AM  Result Value Ref Range   Preg Test, Ur NEGATIVE NEGATIVE  Wet prep, genital     Status: Abnormal   Collection Time: 11/20/17  10:15 AM  Result Value Ref Range   Yeast Wet Prep HPF POC PRESENT (A) NONE SEEN   Trich, Wet Prep NONE SEEN NONE SEEN   Clue Cells Wet Prep HPF POC NONE SEEN NONE SEEN   WBC, Wet Prep HPF POC NONE SEEN NONE SEEN   Sperm NONE SEEN     Assessment and Plan  Candida vaginitis  - Rx for Diflucan 150 mg po x 1 - Information provided on yeast infection   Pelvic pain  - Information provided on pelvic pain - Advised to call [facility] to schedule pelvic U/S   - Discharge  patient - Patient verbalized an understanding of the plan of care and agrees.    Raelyn Mora, MSN, CNM 11/20/2017, 10:06 AM

## 2017-11-21 LAB — GC/CHLAMYDIA PROBE AMP (~~LOC~~) NOT AT ARMC
CHLAMYDIA, DNA PROBE: NEGATIVE
Neisseria Gonorrhea: NEGATIVE

## 2017-12-10 ENCOUNTER — Encounter (HOSPITAL_COMMUNITY): Payer: Self-pay | Admitting: *Deleted

## 2017-12-10 ENCOUNTER — Other Ambulatory Visit: Payer: Self-pay

## 2017-12-10 ENCOUNTER — Inpatient Hospital Stay (HOSPITAL_COMMUNITY)
Admission: AD | Admit: 2017-12-10 | Discharge: 2017-12-10 | Disposition: A | Discharge status: Home / Self Care | Payer: 59 | Source: Ambulatory Visit | Attending: Obstetrics & Gynecology | Admitting: Obstetrics & Gynecology

## 2017-12-10 ENCOUNTER — Inpatient Hospital Stay (HOSPITAL_COMMUNITY): Payer: 59

## 2017-12-10 DIAGNOSIS — F329 Major depressive disorder, single episode, unspecified: Secondary | ICD-10-CM | POA: Diagnosis not present

## 2017-12-10 DIAGNOSIS — Z79899 Other long term (current) drug therapy: Secondary | ICD-10-CM | POA: Diagnosis not present

## 2017-12-10 DIAGNOSIS — Z3490 Encounter for supervision of normal pregnancy, unspecified, unspecified trimester: Secondary | ICD-10-CM | POA: Diagnosis not present

## 2017-12-10 DIAGNOSIS — N189 Chronic kidney disease, unspecified: Secondary | ICD-10-CM | POA: Insufficient documentation

## 2017-12-10 DIAGNOSIS — O283 Abnormal ultrasonic finding on antenatal screening of mother: Secondary | ICD-10-CM | POA: Diagnosis not present

## 2017-12-10 DIAGNOSIS — Z3A01 Less than 8 weeks gestation of pregnancy: Secondary | ICD-10-CM | POA: Insufficient documentation

## 2017-12-10 DIAGNOSIS — Z87442 Personal history of urinary calculi: Secondary | ICD-10-CM | POA: Insufficient documentation

## 2017-12-10 DIAGNOSIS — O99611 Diseases of the digestive system complicating pregnancy, first trimester: Secondary | ICD-10-CM | POA: Diagnosis not present

## 2017-12-10 DIAGNOSIS — O26831 Pregnancy related renal disease, first trimester: Secondary | ICD-10-CM | POA: Diagnosis not present

## 2017-12-10 DIAGNOSIS — E063 Autoimmune thyroiditis: Secondary | ICD-10-CM | POA: Insufficient documentation

## 2017-12-10 DIAGNOSIS — O26891 Other specified pregnancy related conditions, first trimester: Secondary | ICD-10-CM | POA: Diagnosis not present

## 2017-12-10 DIAGNOSIS — F419 Anxiety disorder, unspecified: Secondary | ICD-10-CM | POA: Diagnosis not present

## 2017-12-10 DIAGNOSIS — K219 Gastro-esophageal reflux disease without esophagitis: Secondary | ICD-10-CM | POA: Diagnosis not present

## 2017-12-10 DIAGNOSIS — O3680X0 Pregnancy with inconclusive fetal viability, not applicable or unspecified: Secondary | ICD-10-CM

## 2017-12-10 DIAGNOSIS — O99281 Endocrine, nutritional and metabolic diseases complicating pregnancy, first trimester: Secondary | ICD-10-CM | POA: Diagnosis not present

## 2017-12-10 DIAGNOSIS — R109 Unspecified abdominal pain: Secondary | ICD-10-CM | POA: Insufficient documentation

## 2017-12-10 DIAGNOSIS — O99341 Other mental disorders complicating pregnancy, first trimester: Secondary | ICD-10-CM | POA: Insufficient documentation

## 2017-12-10 LAB — URINALYSIS, ROUTINE W REFLEX MICROSCOPIC
Bilirubin Urine: NEGATIVE
Glucose, UA: NEGATIVE mg/dL
Hgb urine dipstick: NEGATIVE
Ketones, ur: NEGATIVE mg/dL
Nitrite: NEGATIVE
PH: 5 (ref 5.0–8.0)
Protein, ur: NEGATIVE mg/dL
SPECIFIC GRAVITY, URINE: 1.024 (ref 1.005–1.030)

## 2017-12-10 LAB — CBC
HEMATOCRIT: 36.9 % (ref 36.0–46.0)
HEMOGLOBIN: 13.1 g/dL (ref 12.0–15.0)
MCH: 30.1 pg (ref 26.0–34.0)
MCHC: 35.5 g/dL (ref 30.0–36.0)
MCV: 84.8 fL (ref 78.0–100.0)
Platelets: 229 10*3/uL (ref 150–400)
RBC: 4.35 MIL/uL (ref 3.87–5.11)
RDW: 12.6 % (ref 11.5–15.5)
WBC: 7.3 10*3/uL (ref 4.0–10.5)

## 2017-12-10 LAB — HCG, QUANTITATIVE, PREGNANCY: HCG, BETA CHAIN, QUANT, S: 8902 m[IU]/mL — AB (ref <5)

## 2017-12-10 NOTE — Discharge Instructions (Signed)

## 2017-12-10 NOTE — MAU Note (Signed)
Is about [redacted] wks along. (preg confirmed with HCG levels at [facility]  62 then 3 days later 322) Having some intense cramping today, esp on left side.  Called office, has appt for tomorrow, was told if it got worse tonight to come in The patient has a history of miscarriages, tearful in triage. Denies bleeding.

## 2017-12-10 NOTE — MAU Provider Note (Addendum)
Chief Complaint: Abdominal Pain  SUBJECTIVE HPI: Heather [name] is a 32 y.o. G4P1021 at [redacted]w[redacted]d by LMP who presents to maternity admissions reporting LLQ abdominal pain today.  She reports that her labwork in the office was normal, with an appropriate increase in hcg over 48 hours, at 322 on 12/03/17.  She called the office today when the LLQ cramping intermittent abdominal pain started and an appointment was made for tomorrow but she was told to come to MAU for severe pain. She reports the pain is severe, and increasing since onset. She denies bleeding or other symptoms.  She has not tried any treatments.  She has hx of miscarriage x 2 and is worried.    HPI  Past Medical History:  Diagnosis Date  . Anginal pain (HCC)    cardiac workup normal  . Anxiety   . Chest pain   . Chlamydia    in college  . Chronic kidney disease    hx kidney stone  . Depression    on meds doing ok  . GERD (gastroesophageal reflux disease)    with pregnancy  . Hashimoto's disease   . Headache    migraines  . History of ovarian cyst    ruptured  . Hx of varicella   . Hypothyroidism    borderline  . Infection    UTI  . Miscarriage within last 12 months March 2016  . Ovarian cyst   . Shortness of breath dyspnea    with chest pains   Past Surgical History:  Procedure Laterality Date  . APPENDECTOMY    . DILATION AND EVACUATION N/A 08/27/2014   Procedure: DILATATION AND EVACUATION WITH CHROMOSOMES STUDES, ULTRASOUND;  Surgeon: Harold Hedge, MD;  Location: [facility];  Service: Gynecology;  Laterality: N/A;  . LAPAROSCOPIC APPENDECTOMY N/A 01/13/2014   Procedure: APPENDECTOMY LAPAROSCOPIC;  Surgeon: Cherylynn Ridges, MD;  Location: [facility];  Service: General;  Laterality: N/A;  . TOOTH EXTRACTION     Social History   Socioeconomic History  . Marital status: Married    Spouse name: Not on file  . Number of children: Not on file  . Years of education: Not on file  . Highest education level: Not on file   Occupational History  . Not on file  Social Needs  . Financial resource strain: Not on file  . Food insecurity:    Worry: Not on file    Inability: Not on file  . Transportation needs:    Medical: Not on file    Non-medical: Not on file  Tobacco Use  . Smoking status: Never Smoker  . Smokeless tobacco: Never Used  Substance and Sexual Activity  . Alcohol use: Yes    Comment: occasional; not while preg  . Drug use: No  . Sexual activity: Yes  Lifestyle  . Physical activity:    Days per week: Not on file    Minutes per session: Not on file  . Stress: Not on file  Relationships  . Social connections:    Talks on phone: Not on file    Gets together: Not on file    Attends religious service: Not on file    Active member of club or organization: Not on file    Attends meetings of clubs or organizations: Not on file    Relationship status: Not on file  . Intimate partner violence:    Fear of current or ex partner: Not on file    Emotionally abused: Not on file  Physically abused: Not on file    Forced sexual activity: Not on file  Other Topics Concern  . Not on file  Social History Narrative  . Not on file   No current facility-administered medications on file prior to encounter.    Current Outpatient Medications on File Prior to Encounter  Medication Sig Dispense Refill  . levothyroxine (SYNTHROID, LEVOTHROID) 150 MCG tablet Take 150 mcg by mouth daily before breakfast.    . prenatal vitamin w/FE, FA (PRENATAL 1 + 1) 27-1 MG TABS tablet Take 1 tablet by mouth daily at 12 noon.    . fluconazole (DIFLUCAN) 150 MG tablet Take 1 tablet (150 mg total) by mouth daily. 1 tablet 0  . sertraline (ZOLOFT) 100 MG tablet Take 150 mg by mouth daily.      Allergies  Allergen Reactions  . Penicillins Other (See Comments)    Reaction:  Unknown  Has patient had a PCN reaction causing immediate rash, facial/tongue/throat swelling, SOB or lightheadedness with hypotension: Unsure Has  patient had a PCN reaction causing severe rash involving mucus membranes or skin necrosis: Unsure Has patient had a PCN reaction that required hospitalization Unsure Has patient had a PCN reaction occurring within the last 10 years: No If all of the above answers are "NO", then may proceed with Cephalosporin use.  . Monistat 1 [Tioconazole] Rash and Other (See Comments)    Yeast infection cream caused pain.    ROS:  Review of Systems  Constitutional: Negative for chills, fatigue and fever.  Respiratory: Negative for shortness of breath.   Cardiovascular: Negative for chest pain.  Gastrointestinal: Positive for abdominal pain. Negative for nausea and vomiting.  Genitourinary: Positive for pelvic pain. Negative for difficulty urinating, dysuria, flank pain, vaginal bleeding, vaginal discharge and vaginal pain.  Neurological: Negative for dizziness and headaches.  Psychiatric/Behavioral: Negative.      I have reviewed patient's Past Medical Hx, Surgical Hx, Family Hx, Social Hx, medications and allergies.   Physical Exam   Patient Vitals for the past 24 hrs:  BP Temp Temp src Pulse Resp SpO2 Weight  12/10/17 2128 108/78 - - 95 16 - -  12/10/17 1800 115/74 98.8 F (37.1 C) Oral 91 18 100 % 163 lb (73.9 kg)   Constitutional: Well-developed, well-nourished female in no acute distress.  Cardiovascular: normal rate Respiratory: normal effort GI: Abd soft. Pos BS x 4, +tender LMQ & RMQ MS: Extremities nontender, no edema, normal ROM Neurologic: Alert and oriented x 4.  GU: Neg CVAT.   LAB RESULTS Results for orders placed or performed during the hospital encounter of 12/10/17 (from the past 24 hour(s))  Urinalysis, Routine w reflex microscopic     Status: Abnormal   Collection Time: 12/10/17  6:09 PM  Result Value Ref Range   Color, Urine YELLOW YELLOW   APPearance HAZY (A) CLEAR   Specific Gravity, Urine 1.024 1.005 - 1.030   pH 5.0 5.0 - 8.0   Glucose, UA NEGATIVE NEGATIVE  mg/dL   Hgb urine dipstick NEGATIVE NEGATIVE   Bilirubin Urine NEGATIVE NEGATIVE   Ketones, ur NEGATIVE NEGATIVE mg/dL   Protein, ur NEGATIVE NEGATIVE mg/dL   Nitrite NEGATIVE NEGATIVE   Leukocytes, UA TRACE (A) NEGATIVE   RBC / HPF 0-5 0 - 5 RBC/hpf   WBC, UA 0-5 0 - 5 WBC/hpf   Bacteria, UA RARE (A) NONE SEEN   Squamous Epithelial / LPF 6-10 0 - 5   Mucus PRESENT   CBC  Status: None   Collection Time: 12/10/17  6:26 PM  Result Value Ref Range   WBC 7.3 4.0 - 10.5 K/uL   RBC 4.35 3.87 - 5.11 MIL/uL   Hemoglobin 13.1 12.0 - 15.0 g/dL   HCT 54.0 98.1 - 19.1 %   MCV 84.8 78.0 - 100.0 fL   MCH 30.1 26.0 - 34.0 pg   MCHC 35.5 30.0 - 36.0 g/dL   RDW 47.8 29.5 - 62.1 %   Platelets 229 150 - 400 K/uL  hCG, quantitative, pregnancy     Status: Abnormal   Collection Time: 12/10/17  6:26 PM  Result Value Ref Range   hCG, Beta Chain, Quant, S 8,902 (H) <5 mIU/mL      IMAGING US Ob Comp Less 14 Wks  Result Date: 12/10/2017 CLINICAL DATA:  32 year old pregnant female presents with pelvic cramping. Quantitative beta hCG [id number]. EDC by LMP: 08/12/2018, projecting to an expected gestational age of [redacted] weeks 0 days EXAM: OBSTETRIC <14 WK [place] AND TRANSVAGINAL OB US TECHNIQUE: Both transabdominal and transvaginal ultrasound examinations were performed for complete evaluation of the gestation as well as the maternal uterus, adnexal regions, and pelvic cul-de-sac. Transvaginal technique was performed to assess early pregnancy. COMPARISON:  No prior scans from this gestation. FINDINGS: Intrauterine gestational sac: Single intrauterine sac-like structure with double decidual sac sign. Yolk sac:  Not Visualized. Embryo:  Not Visualized. MSD: 6.2 mm   5 w   2 d Subchorionic hemorrhage:  None visualized. Maternal uterus/adnexae: Left ovary measures 2.6 x 1.7 x 1.7 cm. Right ovary measures 4.4 x 2.6 by 2.5 cm and contains a corpus luteum. No abnormal ovarian or adnexal masses. No abnormal free fluid in the  pelvis. IMPRESSION: Single intrauterine sac-like structure without definitive features of pregnancy such as a yolk sac or embryo, which would correlate with a 5 week 2 day intrauterine pregnancy by mean sac diameter. Technically, an occult ectopic gestation remains on the differential. Close clinical monitoring and serial serum beta HCG recommended. Follow-up obstetric scan may be performed in 2-3 weeks, or earlier as dictated by clinical assessment. Electronically Signed   By: Delbert Phenix M.D.   On: 12/10/2017 21:17   US Ob Transvaginal  Result Date: 12/10/2017 CLINICAL DATA:  32 year old pregnant female presents with pelvic cramping. Quantitative beta hCG [id number]. EDC by LMP: 08/12/2018, projecting to an expected gestational age of [redacted] weeks 0 days EXAM: OBSTETRIC <14 WK [place] AND TRANSVAGINAL OB US TECHNIQUE: Both transabdominal and transvaginal ultrasound examinations were performed for complete evaluation of the gestation as well as the maternal uterus, adnexal regions, and pelvic cul-de-sac. Transvaginal technique was performed to assess early pregnancy. COMPARISON:  No prior scans from this gestation. FINDINGS: Intrauterine gestational sac: Single intrauterine sac-like structure with double decidual sac sign. Yolk sac:  Not Visualized. Embryo:  Not Visualized. MSD: 6.2 mm   5 w   2 d Subchorionic hemorrhage:  None visualized. Maternal uterus/adnexae: Left ovary measures 2.6 x 1.7 x 1.7 cm. Right ovary measures 4.4 x 2.6 by 2.5 cm and contains a corpus luteum. No abnormal ovarian or adnexal masses. No abnormal free fluid in the pelvis. IMPRESSION: Single intrauterine sac-like structure without definitive features of pregnancy such as a yolk sac or embryo, which would correlate with a 5 week 2 day intrauterine pregnancy by mean sac diameter. Technically, an occult ectopic gestation remains on the differential. Close clinical monitoring and serial serum beta HCG recommended. Follow-up obstetric scan may be  performed in 2-3 weeks, or  earlier as dictated by clinical assessment. Electronically Signed   By: Delbert PhenixJason A Poff M.D.   On: 12/10/2017 21:17    MAU Management/MDM: CBC, quant hcg, UA, HIV, OB US ordered Presented hcg results to pt, at 8900 today is an appropriate rise from last week US results pending Report to Donette LarryMelanie Shaydon Lease, CNM Follow-up Information    [facility] in [facility] week(s).   Contact information: [address] [place] [address] [phone]          Sharen CounterLisa Leftwich-Kirby Certified Nurse-Midwife 12/10/2017  10:11 PM   +IUGS, no YS or FP. No adnexal mass seen on US, findings could indicate early pregnancy, ectopic pregnancy, or failed pregnancy-discussed with pt.   Presentation and clinical findings discussed with Dr. Langston MaskerMorris. Recommends f/u US in 1-2 weeks in office. Stable for discharge home.  A/P: 1. Early stage of pregnancy   2. Abdominal pain during pregnancy in first trimester   3. Pregnancy, location unknown    Discharge home Follow up in OB office in 1-2 wks for US-scheduled SAB/return precautions Tylenol prn Heating pad prn  Allergies as of 12/10/2017      Reactions   Penicillins Other (See Comments)   Reaction:  Unknown  Has patient had a PCN reaction causing immediate rash, facial/tongue/throat swelling, SOB or lightheadedness with hypotension: Unsure Has patient had a PCN reaction causing severe rash involving mucus membranes or skin necrosis: Unsure Has patient had a PCN reaction that required hospitalization Unsure Has patient had a PCN reaction occurring within the last 10 years: No If all of the above answers are "NO", then may proceed with Cephalosporin use.   Monistat 1 [tioconazole] Rash, Other (See Comments)   Yeast infection cream caused pain.      Medication List    TAKE these medications   fluconazole 150 MG tablet Commonly known as:  DIFLUCAN Take 1 tablet (150 mg total) by mouth daily.    levothyroxine 150 MCG tablet Commonly known as:  SYNTHROID, LEVOTHROID Take 150 mcg by mouth daily before breakfast.   prenatal vitamin w/FE, FA 27-1 MG Tabs tablet Take 1 tablet by mouth daily at 12 noon.   sertraline 100 MG tablet Commonly known as:  ZOLOFT Take 150 mg by mouth daily.       Donette LarryBhambri, Maricela Kawahara, CNM  12/10/2017 10:11 PM

## 2017-12-11 LAB — HIV ANTIBODY (ROUTINE TESTING W REFLEX): HIV SCREEN 4TH GENERATION: NONREACTIVE

## 2017-12-21 ENCOUNTER — Encounter (HOSPITAL_COMMUNITY): Payer: Self-pay | Admitting: *Deleted

## 2017-12-21 ENCOUNTER — Inpatient Hospital Stay (HOSPITAL_COMMUNITY): Payer: 59

## 2017-12-21 ENCOUNTER — Other Ambulatory Visit: Payer: Self-pay

## 2017-12-21 ENCOUNTER — Inpatient Hospital Stay (HOSPITAL_COMMUNITY)
Admission: AD | Admit: 2017-12-21 | Discharge: 2017-12-21 | Disposition: A | Discharge status: Home / Self Care | Payer: 59 | Source: Ambulatory Visit | Attending: Obstetrics and Gynecology | Admitting: Obstetrics and Gynecology

## 2017-12-21 DIAGNOSIS — N189 Chronic kidney disease, unspecified: Secondary | ICD-10-CM | POA: Diagnosis not present

## 2017-12-21 DIAGNOSIS — Z880 Allergy status to penicillin: Secondary | ICD-10-CM | POA: Diagnosis not present

## 2017-12-21 DIAGNOSIS — O219 Vomiting of pregnancy, unspecified: Secondary | ICD-10-CM | POA: Diagnosis not present

## 2017-12-21 DIAGNOSIS — O26831 Pregnancy related renal disease, first trimester: Secondary | ICD-10-CM | POA: Diagnosis not present

## 2017-12-21 DIAGNOSIS — Z3A01 Less than 8 weeks gestation of pregnancy: Secondary | ICD-10-CM | POA: Insufficient documentation

## 2017-12-21 DIAGNOSIS — Z3491 Encounter for supervision of normal pregnancy, unspecified, first trimester: Secondary | ICD-10-CM

## 2017-12-21 DIAGNOSIS — K117 Disturbances of salivary secretion: Secondary | ICD-10-CM

## 2017-12-21 DIAGNOSIS — R109 Unspecified abdominal pain: Secondary | ICD-10-CM | POA: Insufficient documentation

## 2017-12-21 DIAGNOSIS — O26891 Other specified pregnancy related conditions, first trimester: Secondary | ICD-10-CM | POA: Diagnosis not present

## 2017-12-21 LAB — BASIC METABOLIC PANEL
Anion gap: 8 (ref 5–15)
BUN: 9 mg/dL (ref 6–20)
CALCIUM: 9.4 mg/dL (ref 8.9–10.3)
CHLORIDE: 106 mmol/L (ref 98–111)
CO2: 21 mmol/L — ABNORMAL LOW (ref 22–32)
CREATININE: 0.61 mg/dL (ref 0.44–1.00)
Glucose, Bld: 100 mg/dL — ABNORMAL HIGH (ref 70–99)
Potassium: 4.2 mmol/L (ref 3.5–5.1)
SODIUM: 135 mmol/L (ref 135–145)

## 2017-12-21 LAB — URINALYSIS, ROUTINE W REFLEX MICROSCOPIC
BILIRUBIN URINE: NEGATIVE
Glucose, UA: NEGATIVE mg/dL
HGB URINE DIPSTICK: NEGATIVE
Ketones, ur: NEGATIVE mg/dL
Nitrite: NEGATIVE
PROTEIN: NEGATIVE mg/dL
SPECIFIC GRAVITY, URINE: 1.029 (ref 1.005–1.030)
pH: 5 (ref 5.0–8.0)

## 2017-12-21 MED ORDER — GLYCOPYRROLATE 2 MG PO TABS: 1.0000 mg | ORAL_TABLET | Freq: Three times a day (TID) | ORAL | 0 refills | Status: DC | PRN

## 2017-12-21 MED ORDER — PROMETHAZINE HCL 25 MG/ML IJ SOLN
25.0000 mg | Freq: Once | INTRAMUSCULAR | Status: AC
  Administered 2017-12-21: 25 mg via INTRAVENOUS
  Filled 2017-12-21: qty 1

## 2017-12-21 MED ORDER — GLYCOPYRROLATE 0.2 MG/ML IJ SOLN
0.2000 mg | Freq: Once | INTRAMUSCULAR | Status: AC
  Administered 2017-12-21: 0.2 mg via INTRAVENOUS
  Filled 2017-12-21: qty 1

## 2017-12-21 MED ORDER — LACTATED RINGERS IV BOLUS
1000.0000 mL | Freq: Once | INTRAVENOUS | Status: AC
  Administered 2017-12-21: 1000 mL via INTRAVENOUS

## 2017-12-21 MED ORDER — METHYLPREDNISOLONE SODIUM SUCC 125 MG IJ SOLR
40.0000 mg | Freq: Once | INTRAMUSCULAR | Status: AC
Start: 2017-12-21 — End: 2017-12-21
  Administered 2017-12-21: 40 mg via INTRAVENOUS
  Filled 2017-12-21: qty 2

## 2017-12-21 MED ORDER — METHYLPREDNISOLONE 4 MG PO TABS: ORAL_TABLET | ORAL | 0 refills | Status: AC

## 2017-12-21 MED ORDER — LACTATED RINGERS IV SOLN
INTRAVENOUS | Status: DC
  Administered 2017-12-21: 13:00:00 via INTRAVENOUS

## 2017-12-21 MED ORDER — PROMETHAZINE HCL 25 MG PO TABS: 25.0000 mg | ORAL_TABLET | Freq: Four times a day (QID) | ORAL | 0 refills | Status: DC | PRN

## 2017-12-21 MED ORDER — FAMOTIDINE IN NACL 20-0.9 MG/50ML-% IV SOLN
20.0000 mg | Freq: Once | INTRAVENOUS | Status: AC
  Administered 2017-12-21: 20 mg via INTRAVENOUS
  Filled 2017-12-21: qty 50

## 2017-12-21 NOTE — Discharge Instructions (Signed)
Eating Plan for Hyperemesis Gravidarum °Hyperemesis gravidarum is a severe form of morning sickness. Because this condition causes severe nausea and vomiting, it can lead to dehydration, malnutrition, and weight loss. One way to lessen the symptoms of nausea and vomiting is to follow the eating plan for hyperemesis gravidarum. It is often used along with prescribed medicines to control your symptoms. °What can I do to relieve my symptoms? °Listen to your body. Everyone is different and has different preferences. Find what works best for you. Take any of the following actions that are helpful to you: °· Eat and drink slowly. °· Eat 5-6 small meals daily instead of 3 large meals. °· Eat crackers before you get out of bed in the morning. °· Try having a snack in the middle of the night. °· Starchy foods are usually tolerated well. Examples include cereal, toast, bread, potatoes, pasta, rice, and pretzels. °· Ginger may help with nausea. Add ¼ tsp ground ginger to hot tea or choose ginger tea. °· Try drinking 100% fruit juice or an electrolyte drink. An electrolyte drink contains sodium, potassium, and chloride. °· Continue to take your prenatal vitamins as told by your health care provider. If you are having trouble taking your prenatal vitamins, talk with your health care provider about different options. °· Include at least 1 serving of protein with your meals and snacks. Protein options include meats or poultry, beans, nuts, eggs, and yogurt. Try eating a protein-rich snack before bed. Examples of these snacks include cheese and crackers or half of a peanut butter or turkey sandwich. °· Consider eliminating foods that trigger your symptoms. These may include spicy foods, coffee, high-fat foods, very sweet foods, and acidic foods. °· Try meals that have more protein combined with bland, salty, lower-fat, and dry foods, such as nuts, seeds, pretzels, crackers, and cereal. °· Talk with your healthcare provider about  starting a supplement of vitamin B6. °· Have fluids that are cold, clear, and carbonated or sour. Examples include lemonade, ginger ale, lemon-lime soda, ice water, and sparkling water. °· Try lemon or mint tea. °· Try brushing your teeth or using a mouth rinse after meals. ° °What should I avoid to reduce my symptoms? °Avoiding some of the following things may help reduce your symptoms. °· Foods with strong smells. Try eating meals in well-ventilated areas that are free of odors. °· Drinking water or other beverages with meals. Try not to drink anything during the 30 minutes before and after your meals. °· Drinking more than 1 cup of fluid at a time. Sometimes using a straw helps. °· Fried or high-fat foods, such as butter and cream sauces. °· Spicy foods. °· Skipping meals as best as you can. Nausea can be more intense on an empty stomach. If you cannot tolerate food at that time, do not force it. Try sucking on ice chips or other frozen items, and make up for missed calories later. °· Lying down within 2 hours after eating. °· Environmental triggers. These may include smoky rooms, closed spaces, rooms with strong smells, warm or humid places, overly loud and noisy rooms, and rooms with motion or flickering lights. °· Quick and sudden changes in your movement. ° °This information is not intended to replace advice given to you by your health care provider. Make sure you discuss any questions you have with your health care provider. °Document Released: 04/02/2007 Document Revised: 02/02/2016 Document Reviewed: 01/04/2016 °Elsevier Interactive Patient Education © 2018 [employer] ° °

## 2017-12-21 NOTE — MAU Provider Note (Signed)
History     CSN: [id number]  Arrival date and time: 12/21/17 0944   First Provider Initiated Contact with Patient 12/21/17 1056      Chief Complaint  Patient presents with  . Emesis   HPI  Elinda Bunten is a 32 y.o. [id number] at [redacted]w[redacted]d who presents with nausea & vomiting. Has been taking bonjesta with control of symptoms until a few days ago. Is now vomiting 4-5 times per day. Has hx of HEG with previous pregnancies & states she feels like that is "ramping up again". Also reports increase in spitting with hx of ptyalism throughout last pregnancy. Was able to keep down a plain waffle this morning but having trouble keeping down fluids and last vomited just before walking into MAU. Denies fever/chills, heartburn, vaginal bleeding, or diarrhea. Endorses some lower abdominal cramping that is intermittent. Rates pain 3/10. Last ultrasound showed empty gestational sac & has scheduled ultrasound in office next week.   OB History    Gravida  4   Para  1   Term  1   Preterm      AB  2   Living  1     SAB  2   TAB      Ectopic      Multiple  0   Live Births  1           Past Medical History:  Diagnosis Date  . Anginal pain (HCC)    cardiac workup normal  . Anxiety   . Chlamydia    in college  . Chronic kidney disease    hx kidney stone  . Depression   . GERD (gastroesophageal reflux disease)    with pregnancy  . Hashimoto's disease   . Headache    migraines  . History of ovarian cyst    ruptured  . Hx of varicella   . Hypothyroidism    borderline    Past Surgical History:  Procedure Laterality Date  . DILATION AND EVACUATION N/A 08/27/2014   Procedure: DILATATION AND EVACUATION WITH CHROMOSOMES STUDES, ULTRASOUND;  Surgeon: Harold Hedge, MD;  Location: [facility];  Service: Gynecology;  Laterality: N/A;  . LAPAROSCOPIC APPENDECTOMY N/A 01/13/2014   Procedure: APPENDECTOMY LAPAROSCOPIC;  Surgeon: Cherylynn Ridges, MD;  Location: [facility];  Service: General;  Laterality:  N/A;  . TOOTH EXTRACTION      Family History  Problem Relation Age of Onset  . Hypertension Mother   . Diabetes Mother   . Cancer Maternal Grandmother   . Cancer Maternal Grandfather   . Rheum arthritis Maternal Grandfather     Social History   Tobacco Use  . Smoking status: Never Smoker  . Smokeless tobacco: Never Used  Substance Use Topics  . Alcohol use: Not Currently  . Drug use: No    Allergies:  Allergies  Allergen Reactions  . Penicillins Other (See Comments)    Reaction:  Unknown  Has patient had a PCN reaction causing immediate rash, facial/tongue/throat swelling, SOB or lightheadedness with hypotension: Unsure Has patient had a PCN reaction causing severe rash involving mucus membranes or skin necrosis: Unsure Has patient had a PCN reaction that required hospitalization Unsure Has patient had a PCN reaction occurring within the last 10 years: No If all of the above answers are "NO", then may proceed with Cephalosporin use.  . Monistat 1 [Tioconazole] Rash and Other (See Comments)    Yeast infection cream caused pain.    Medications Prior to Admission  Medication Sig  Dispense Refill Last Dose  . BONJESTA 20-20 MG TBCR Take 1 tablet by mouth 2 (two) times daily.   3 12/21/2017 at Unknown time  . levothyroxine (SYNTHROID, LEVOTHROID) 175 MCG tablet Take 175 mcg by mouth daily before breakfast. Pt takes the 175mcg tablet with half of a 25mcg tablet.   12/21/2017 at Unknown time  . levothyroxine (SYNTHROID, LEVOTHROID) 25 MCG tablet Take 12.5 mcg by mouth daily before breakfast. Pt takes 175mcg tablet with half of a 25mcg tablet   12/21/2017 at Unknown time  . pyridoxine (B-6) 100 MG tablet Take 100 mg by mouth daily.   12/18/2017  . vitamin B-12 (CYANOCOBALAMIN) 500 MCG tablet Take 500 mcg by mouth daily.   12/18/2017  . fluconazole (DIFLUCAN) 150 MG tablet Take 1 tablet (150 mg total) by mouth daily. (Patient not taking: Reported on 12/21/2017) 1 tablet 0 Not Taking at Unknown  time  . folic acid (FOLVITE) 400 MCG tablet Take 1 tablet once a day   12/18/2017    Review of Systems  Constitutional: Negative.   Gastrointestinal: Positive for abdominal pain, nausea and vomiting. Negative for diarrhea.  Genitourinary: Negative.    Physical Exam   Blood pressure 101/70, pulse 91, temperature 98.7 F (37.1 C), temperature source Oral, resp. rate 16, weight 158 lb 12 oz (72 kg), last menstrual period 11/05/2017, SpO2 100 %, not currently breastfeeding.  Physical Exam  Nursing note and vitals reviewed. Constitutional: She is oriented to person, place, and time. She appears well-developed and well-nourished. No distress.  HENT:  Head: Normocephalic and atraumatic.  Mouth/Throat: Mucous membranes are dry.  Eyes: Conjunctivae are normal. Right eye exhibits no discharge. Left eye exhibits no discharge. No scleral icterus.  Neck: Normal range of motion.  Cardiovascular: Normal rate, regular rhythm and normal heart sounds.  No murmur heard. Respiratory: Effort normal and breath sounds normal. No respiratory distress. She has no wheezes.  GI: Soft. Bowel sounds are normal. She exhibits no distension. There is no tenderness. There is no rebound.  Neurological: She is alert and oriented to person, place, and time.  Skin: Skin is warm and dry. She is not diaphoretic.  Psychiatric: She has a normal mood and affect. Her behavior is normal. Judgment and thought content normal.    MAU Course  Procedures Results for orders placed or performed during the hospital encounter of 12/21/17 (from the past 24 hour(s))  Urinalysis, Routine w reflex microscopic     Status: Abnormal   Collection Time: 12/21/17 10:12 AM  Result Value Ref Range   Color, Urine YELLOW YELLOW   APPearance HAZY (A) CLEAR   Specific Gravity, Urine 1.029 1.005 - 1.030   pH 5.0 5.0 - 8.0   Glucose, UA NEGATIVE NEGATIVE mg/dL   Hgb urine dipstick NEGATIVE NEGATIVE   Bilirubin Urine NEGATIVE NEGATIVE   Ketones,  ur NEGATIVE NEGATIVE mg/dL   Protein, ur NEGATIVE NEGATIVE mg/dL   Nitrite NEGATIVE NEGATIVE   Leukocytes, UA SMALL (A) NEGATIVE   RBC / HPF 0-5 0 - 5 RBC/hpf   WBC, UA 0-5 0 - 5 WBC/hpf   Bacteria, UA RARE (A) NONE SEEN   Squamous Epithelial / LPF 11-20 0 - 5   Mucus PRESENT   Basic metabolic panel     Status: Abnormal   Collection Time: 12/21/17 11:18 AM  Result Value Ref Range   Sodium 135 135 - 145 mmol/L   Potassium 4.2 3.5 - 5.1 mmol/L   Chloride 106 98 - 111 mmol/L  CO2 21 (L) 22 - 32 mmol/L   Glucose, Bld 100 (H) 70 - 99 mg/dL   BUN 9 6 - 20 mg/dL   Creatinine, Ser 4.09 0.44 - 1.00 mg/dL   Calcium 9.4 8.9 - 81.1 mg/dL   GFR calc non Af Amer >60 >60 mL/min   GFR calc Af Amer >60 >60 mL/min   Anion gap 8 5 - 15   US Ob Transvaginal  Result Date: 12/21/2017 CLINICAL DATA:  Abdominal pain EXAM: TRANSVAGINAL OB ULTRASOUND TECHNIQUE: Transvaginal ultrasound was performed for complete evaluation of the gestation as well as the maternal uterus, adnexal regions, and pelvic cul-de-sac. COMPARISON:  12/10/2017 FINDINGS: Intrauterine gestational sac: Visualized, single Yolk sac:  Visualized Embryo:  Visualized Cardiac Activity: Visualized Heart Rate: 133 bpm MSD:   mm    w     d CRL:   5.8 mm   6 w 2 d                  [place] EDC: 08/14/2018 Subchorionic hemorrhage:  Small subchorionic hemorrhage Maternal uterus/adnexae: No adnexal masses or free fluid. IMPRESSION: Six week 2 day intrauterine pregnancy. Fetal heart rate 133 beats per minute. Small subchorionic hemorrhage. Electronically Signed   By: Charlett Nose M.D.   On: 12/21/2017 12:28    MDM IV fluids, LR x 2 bags. Phenergan 25 mg, pepcid 20 mg, & robinul 0.2mg . Patient sent to ultrasound d/t no confirmed IUP & abdominal pain. When returned from ultrasound was vomiting again. Initial dose of steroids given (medrol 40 mg IV). Pt reports improvement in symptoms and able to tolerate POs.  Ultrasound shows SIUP with cardiac activity, size c/w  LMP dating.   C/w Dr. Corinna Lines to discharge home with antiemetic & medrol taper  Assessment and Plan  A: 1. Nausea and vomiting during pregnancy prior to [redacted] weeks gestation   2. Abdominal pain during pregnancy in first trimester   3. Less than [redacted] weeks gestation of pregnancy   4. Ptyalism   5. Normal IUP (intrauterine pregnancy) on prenatal ultrasound, first trimester    P: Discharge home Rx phenergan & medrol taper Discussed reasons to return to [facility]U HEG diet education given Keep f/u with OB  Judeth Horn 12/21/2017, 10:57 AM

## 2017-12-21 NOTE — MAU Note (Signed)
Hx of hyperemesis with prior pregnancies, thinks it is ramping up again.  Is throwing up 5/6 times a day.  Has ptyalism, which isn't helping- starting to feel dehydrated.

## 2017-12-30 ENCOUNTER — Encounter (HOSPITAL_COMMUNITY): Payer: Self-pay | Admitting: *Deleted

## 2017-12-30 ENCOUNTER — Inpatient Hospital Stay (HOSPITAL_COMMUNITY)
Admission: AD | Admit: 2017-12-30 | Discharge: 2017-12-30 | Disposition: A | Discharge status: Home / Self Care | Payer: 59 | Source: Ambulatory Visit | Attending: Obstetrics and Gynecology | Admitting: Obstetrics and Gynecology

## 2017-12-30 DIAGNOSIS — K219 Gastro-esophageal reflux disease without esophagitis: Secondary | ICD-10-CM | POA: Insufficient documentation

## 2017-12-30 DIAGNOSIS — Z880 Allergy status to penicillin: Secondary | ICD-10-CM | POA: Diagnosis not present

## 2017-12-30 DIAGNOSIS — O26831 Pregnancy related renal disease, first trimester: Secondary | ICD-10-CM | POA: Diagnosis not present

## 2017-12-30 DIAGNOSIS — N189 Chronic kidney disease, unspecified: Secondary | ICD-10-CM | POA: Insufficient documentation

## 2017-12-30 DIAGNOSIS — Z79890 Hormone replacement therapy (postmenopausal): Secondary | ICD-10-CM | POA: Insufficient documentation

## 2017-12-30 DIAGNOSIS — O210 Mild hyperemesis gravidarum: Secondary | ICD-10-CM

## 2017-12-30 DIAGNOSIS — Z87442 Personal history of urinary calculi: Secondary | ICD-10-CM | POA: Diagnosis not present

## 2017-12-30 DIAGNOSIS — R42 Dizziness and giddiness: Secondary | ICD-10-CM | POA: Diagnosis present

## 2017-12-30 DIAGNOSIS — Z3A11 11 weeks gestation of pregnancy: Secondary | ICD-10-CM | POA: Diagnosis not present

## 2017-12-30 DIAGNOSIS — Z9889 Other specified postprocedural states: Secondary | ICD-10-CM | POA: Diagnosis not present

## 2017-12-30 DIAGNOSIS — Z7952 Long term (current) use of systemic steroids: Secondary | ICD-10-CM | POA: Insufficient documentation

## 2017-12-30 DIAGNOSIS — E039 Hypothyroidism, unspecified: Secondary | ICD-10-CM | POA: Diagnosis not present

## 2017-12-30 DIAGNOSIS — O99281 Endocrine, nutritional and metabolic diseases complicating pregnancy, first trimester: Secondary | ICD-10-CM | POA: Diagnosis not present

## 2017-12-30 DIAGNOSIS — Z79899 Other long term (current) drug therapy: Secondary | ICD-10-CM | POA: Diagnosis not present

## 2017-12-30 DIAGNOSIS — O99611 Diseases of the digestive system complicating pregnancy, first trimester: Secondary | ICD-10-CM | POA: Diagnosis not present

## 2017-12-30 DIAGNOSIS — R079 Chest pain, unspecified: Secondary | ICD-10-CM | POA: Diagnosis present

## 2017-12-30 LAB — URINALYSIS, ROUTINE W REFLEX MICROSCOPIC
Bilirubin Urine: NEGATIVE
Glucose, UA: NEGATIVE mg/dL
Hgb urine dipstick: NEGATIVE
Ketones, ur: NEGATIVE mg/dL
Nitrite: NEGATIVE
Protein, ur: NEGATIVE mg/dL
Specific Gravity, Urine: 1.033 — ABNORMAL HIGH (ref 1.005–1.030)
pH: 5 (ref 5.0–8.0)

## 2017-12-30 LAB — COMPREHENSIVE METABOLIC PANEL
ALT: 13 U/L (ref 0–44)
AST: 19 U/L (ref 15–41)
Albumin: 4 g/dL (ref 3.5–5.0)
Alkaline Phosphatase: 39 U/L (ref 38–126)
Anion gap: 11 (ref 5–15)
BUN: 12 mg/dL (ref 6–20)
CO2: 23 mmol/L (ref 22–32)
Calcium: 9.1 mg/dL (ref 8.9–10.3)
Chloride: 103 mmol/L (ref 98–111)
Creatinine, Ser: 0.55 mg/dL (ref 0.44–1.00)
GFR calc Af Amer: >60 mL/min (ref >60)
GFR calc non Af Amer: >60 mL/min (ref >60)
Glucose, Bld: 96 mg/dL (ref 70–99)
Potassium: 3.4 mmol/L — ABNORMAL LOW (ref 3.5–5.1)
Sodium: 137 mmol/L (ref 135–145)
Total Bilirubin: 1 mg/dL (ref 0.3–1.2)
Total Protein: 6.7 g/dL (ref 6.5–8.1)

## 2017-12-30 LAB — CBC
HCT: 37.5 % (ref 36.0–46.0)
Hemoglobin: 13.4 g/dL (ref 12.0–15.0)
MCH: 30.4 pg (ref 26.0–34.0)
MCHC: 35.7 g/dL (ref 30.0–36.0)
MCV: 85 fL (ref 78.0–100.0)
Platelets: 201 10*3/uL (ref 150–400)
RBC: 4.41 MIL/uL (ref 3.87–5.11)
RDW: 12.4 % (ref 11.5–15.5)
WBC: 12.9 10*3/uL — ABNORMAL HIGH (ref 4.0–10.5)

## 2017-12-30 LAB — TSH: TSH: 0.028 u[IU]/mL — ABNORMAL LOW (ref 0.350–4.500)

## 2017-12-30 LAB — T4, FREE: Free T4: 1.8 ng/dL — ABNORMAL HIGH (ref 0.82–1.77)

## 2017-12-30 MED ORDER — PROMETHAZINE HCL 25 MG/ML IJ SOLN
25.0000 mg | Freq: Once | INTRAMUSCULAR | Status: AC
  Administered 2017-12-30: 25 mg via INTRAVENOUS
  Filled 2017-12-30: qty 1

## 2017-12-30 MED ORDER — FAMOTIDINE IN NACL 20-0.9 MG/50ML-% IV SOLN
20.0000 mg | Freq: Once | INTRAVENOUS | Status: AC
  Administered 2017-12-30: 20 mg via INTRAVENOUS
  Filled 2017-12-30: qty 50

## 2017-12-30 MED ORDER — FAMOTIDINE 20 MG PO TABS: 20.0000 mg | ORAL_TABLET | Freq: Every day | ORAL | 0 refills | Status: DC

## 2017-12-30 MED ORDER — LACTATED RINGERS IV BOLUS
1000.0000 mL | Freq: Once | INTRAVENOUS | Status: AC
  Administered 2017-12-30: 1000 mL via INTRAVENOUS

## 2017-12-30 NOTE — Discharge Instructions (Signed)
Hyperemesis Gravidarum °Hyperemesis gravidarum is a severe form of nausea and vomiting that happens during pregnancy. Hyperemesis is worse than morning sickness. It may cause you to have nausea or vomiting all day for many days. It may keep you from eating and drinking enough food and liquids. Hyperemesis usually occurs during the first half (the first 20 weeks) of pregnancy. It often goes away once a woman is in her second half of pregnancy. However, sometimes hyperemesis continues through an entire pregnancy. °What are the causes? °The cause of this condition is not known. It may be related to changes in chemicals (hormones) in the body during pregnancy, such as the high level of pregnancy hormone (human chorionic gonadotropin) or the increase in the female sex hormone (estrogen). °What are the signs or symptoms? °Symptoms of this condition include: °· Severe nausea and vomiting. °· Nausea that does not go away. °· Vomiting that does not allow you to keep any food down. °· Weight loss. °· Body fluid loss (dehydration). °· Having no desire to eat, or not liking food that you have previously enjoyed. ° °How is this diagnosed? °This condition may be diagnosed based on: °· A physical exam. °· Your medical history. °· Your symptoms. °· Blood tests. °· Urine tests. ° °How is this treated? °This condition may be managed with medicine. If medicines to do not help relieve nausea and vomiting, you may need to receive fluids through an IV tube at the hospital. °Follow these instructions at home: °· Take over-the-counter and prescription medicines only as told by your health care provider. °· Avoid iron pills and multivitamins that contain iron for the first 3-4 months of pregnancy. If you take prescription iron pills, do not stop taking them unless your health care provider approves. °· Take the following actions to help prevent nausea and vomiting: °? In the morning, before getting out of bed, try eating a couple of dry  crackers or a piece of toast. °? Avoid foods and smells that upset your stomach. Fatty and spicy foods may make nausea worse. °? Eat 5-6 small meals a day. °? Do not drink fluids while eating meals. Drink between meals. °? Eat or suck on things that have ginger in them. Ginger can help relieve nausea. °? Avoid food preparation. The smell of food can spoil your appetite or trigger nausea. °· Follow instructions from your health care provider about eating or drinking restrictions. °· For snacks, eat high-protein foods, such as cheese. °· Keep all follow-up and pre-birth (prenatal) visits as told by your health care provider. This is important. °Contact a health care provider if: °· You have pain in your abdomen. °· You have a severe headache. °· You have vision problems. °· You are losing weight. °Get help right away if: °· You cannot drink fluids without vomiting. °· You vomit blood. °· You have constant nausea and vomiting. °· You are very weak. °· You are very thirsty. °· You feel dizzy. °· You faint. °· You have a fever or other symptoms that last for more than 2-3 days. °· You have a fever and your symptoms suddenly get worse. °Summary °· Hyperemesis gravidarum is a severe form of nausea and vomiting that happens during pregnancy. °· Making some changes to your eating habits may help relieve nausea and vomiting. °· This condition may be managed with medicine. °· If medicines to do not help relieve nausea and vomiting, you may need to receive fluids through an IV tube at the hospital. °This   information is not intended to replace advice given to you by your health care provider. Make sure you discuss any questions you have with your health care provider. °Document Released: 06/05/2005 Document Revised: 02/02/2016 Document Reviewed: 02/02/2016 °Elsevier Interactive Patient Education © 2017 [employer] °Eating Plan for Hyperemesis Gravidarum °Hyperemesis gravidarum is a severe form of morning sickness. Because this  condition causes severe nausea and vomiting, it can lead to dehydration, malnutrition, and weight loss. One way to lessen the symptoms of nausea and vomiting is to follow the eating plan for hyperemesis gravidarum. It is often used along with prescribed medicines to control your symptoms. °What can I do to relieve my symptoms? °Listen to your body. Everyone is different and has different preferences. Find what works best for you. Take any of the following actions that are helpful to you: °· Eat and drink slowly. °· Eat 5-6 small meals daily instead of 3 large meals. °· Eat crackers before you get out of bed in the morning. °· Try having a snack in the middle of the night. °· Starchy foods are usually tolerated well. Examples include cereal, toast, bread, potatoes, pasta, rice, and pretzels. °· Ginger may help with nausea. Add ¼ tsp ground ginger to hot tea or choose ginger tea. °· Try drinking 100% fruit juice or an electrolyte drink. An electrolyte drink contains sodium, potassium, and chloride. °· Continue to take your prenatal vitamins as told by your health care provider. If you are having trouble taking your prenatal vitamins, talk with your health care provider about different options. °· Include at least 1 serving of protein with your meals and snacks. Protein options include meats or poultry, beans, nuts, eggs, and yogurt. Try eating a protein-rich snack before bed. Examples of these snacks include cheese and crackers or half of a peanut butter or turkey sandwich. °· Consider eliminating foods that trigger your symptoms. These may include spicy foods, coffee, high-fat foods, very sweet foods, and acidic foods. °· Try meals that have more protein combined with bland, salty, lower-fat, and dry foods, such as nuts, seeds, pretzels, crackers, and cereal. °· Talk with your healthcare provider about starting a supplement of vitamin B6. °· Have fluids that are cold, clear, and carbonated or sour. Examples include  lemonade, ginger ale, lemon-lime soda, ice water, and sparkling water. °· Try lemon or mint tea. °· Try brushing your teeth or using a mouth rinse after meals. ° °What should I avoid to reduce my symptoms? °Avoiding some of the following things may help reduce your symptoms. °· Foods with strong smells. Try eating meals in well-ventilated areas that are free of odors. °· Drinking water or other beverages with meals. Try not to drink anything during the 30 minutes before and after your meals. °· Drinking more than 1 cup of fluid at a time. Sometimes using a straw helps. °· Fried or high-fat foods, such as butter and cream sauces. °· Spicy foods. °· Skipping meals as best as you can. Nausea can be more intense on an empty stomach. If you cannot tolerate food at that time, do not force it. Try sucking on ice chips or other frozen items, and make up for missed calories later. °· Lying down within 2 hours after eating. °· Environmental triggers. These may include smoky rooms, closed spaces, rooms with strong smells, warm or humid places, overly loud and noisy rooms, and rooms with motion or flickering lights. °· Quick and sudden changes in your movement. ° °This information is not   intended to replace advice given to you by your health care provider. Make sure you discuss any questions you have with your health care provider. °Document Released: 04/02/2007 Document Revised: 02/02/2016 Document Reviewed: 01/04/2016 °Elsevier Interactive Patient Education © 2018 [employer] ° °

## 2017-12-30 NOTE — MAU Provider Note (Signed)
Chief Complaint: Chest Pain and Dizziness   First Provider Initiated Contact with Patient 12/30/17 1312      SUBJECTIVE HPI: Heather [name] is a 32 y.o. G4P1021 at [redacted]w[redacted]d by LMP who presents to maternity admissions reporting chest pain and weakness. She is being treated for HEG. Recently started on at home IV fluids. Home nurse came out on Friday & gave her LR. Is still taking oral meds: bonjesta, phenergan, & completing a steroid taper.  Current symptoms began this morning. Reports feeling like her heart was fluttering then started feeling mid chest pressure. Describes as tightness and pressure & rates 6/10. Hs not treated. No SOB or cough. Has not continued to feel palpitations. She has hashimotos & is currently on synthroid 187.5 mcg; dose was last adjusted when she first found out she was pregnant.  Also reports feeling lightheaded with symptoms, but states she feels that way at times d/t HEG.  Continues to feel nauseated but has not vomited today. Mostly spitting.  Aggravating factors: none Alleiviating factors: none She denies vaginal bleeding, vaginal itching/burning, urinary symptoms, h/a, or fever/chills.     Past Medical History:  Diagnosis Date  . Anginal pain (HCC)    cardiac workup normal  . Anxiety   . Chlamydia    in college  . Chronic kidney disease    hx kidney stone  . Depression   . GERD (gastroesophageal reflux disease)    with pregnancy  . Hashimoto's disease   . Headache    migraines  . History of ovarian cyst    ruptured  . Hx of varicella   . Hypothyroidism    borderline   Past Surgical History:  Procedure Laterality Date  . DILATION AND EVACUATION N/A 08/27/2014   Procedure: DILATATION AND EVACUATION WITH CHROMOSOMES STUDES, ULTRASOUND;  Surgeon: Harold Hedge, MD;  Location: [facility];  Service: Gynecology;  Laterality: N/A;  . LAPAROSCOPIC APPENDECTOMY N/A 01/13/2014   Procedure: APPENDECTOMY LAPAROSCOPIC;  Surgeon: Cherylynn Ridges, MD;  Location: [facility] OR;   Service: General;  Laterality: N/A;  . TOOTH EXTRACTION     Social History   Socioeconomic History  . Marital status: Married    Spouse name: Not on file  . Number of children: Not on file  . Years of education: Not on file  . Highest education level: Not on file  Occupational History  . Not on file  Social Needs  . Financial resource strain: Not on file  . Food insecurity:    Worry: Not on file    Inability: Not on file  . Transportation needs:    Medical: Not on file    Non-medical: Not on file  Tobacco Use  . Smoking status: Never Smoker  . Smokeless tobacco: Never Used  Substance and Sexual Activity  . Alcohol use: Not Currently  . Drug use: No  . Sexual activity: Yes  Lifestyle  . Physical activity:    Days per week: Not on file    Minutes per session: Not on file  . Stress: Not on file  Relationships  . Social connections:    Talks on phone: Not on file    Gets together: Not on file    Attends religious service: Not on file    Active member of club or organization: Not on file    Attends meetings of clubs or organizations: Not on file    Relationship status: Not on file  . Intimate partner violence:    Fear of current or ex  partner: Not on file    Emotionally abused: Not on file    Physically abused: Not on file    Forced sexual activity: Not on file  Other Topics Concern  . Not on file  Social History Narrative  . Not on file   No current facility-administered medications on file prior to encounter.    Current Outpatient Medications on File Prior to Encounter  Medication Sig Dispense Refill  . BONJESTA 20-20 MG TBCR Take 1 tablet by mouth 2 (two) times daily.   3  . folic acid (FOLVITE) 400 MCG tablet Take 1 tablet once a day    . glycopyrrolate (ROBINUL) 2 MG tablet Take 0.5 tablets (1 mg total) by mouth 3 (three) times daily as needed. 60 tablet 0  . levothyroxine (SYNTHROID, LEVOTHROID) 175 MCG tablet Take 175 mcg by mouth daily before breakfast. Pt  takes the 175mcg tablet with half of a 25mcg tablet.    Marland Kitchen. levothyroxine (SYNTHROID, LEVOTHROID) 25 MCG tablet Take 12.5 mcg by mouth daily before breakfast. Pt takes 175mcg tablet with half of a 25mcg tablet    . methylPREDNISolone (MEDROL) 4 MG tablet Take 4 tablets (16 mg total) by mouth every 8 (eight) hours for 3 days, THEN 2 tablets (8 mg total) every 8 (eight) hours for 3 days, THEN 1 tablet (4 mg total) every 8 (eight) hours for 3 days, THEN 0.5 tablets (2 mg total) every 8 (eight) hours for 3 days. 68 tablet 0  . promethazine (PHENERGAN) 25 MG tablet Take 1 tablet (25 mg total) by mouth every 6 (six) hours as needed for nausea or vomiting. 30 tablet 0  . pyridoxine (B-6) 100 MG tablet Take 100 mg by mouth daily.    . vitamin B-12 (CYANOCOBALAMIN) 500 MCG tablet Take 500 mcg by mouth daily.     Allergies  Allergen Reactions  . Penicillins Other (See Comments)    Reaction:  Unknown  Has patient had a PCN reaction causing immediate rash, facial/tongue/throat swelling, SOB or lightheadedness with hypotension: Unsure Has patient had a PCN reaction causing severe rash involving mucus membranes or skin necrosis: Unsure Has patient had a PCN reaction that required hospitalization Unsure Has patient had a PCN reaction occurring within the last 10 years: No If all of the above answers are "NO", then may proceed with Cephalosporin use.  . Monistat 1 [Tioconazole] Rash and Other (See Comments)    Yeast infection cream caused pain.    ROS:  Review of Systems  Constitutional: Negative.   Respiratory: Negative for cough and shortness of breath.   Cardiovascular: Positive for chest pain and palpitations.  Gastrointestinal: Positive for nausea. Negative for abdominal pain and vomiting.  Genitourinary: Negative.   Neurological: Positive for light-headedness. Negative for syncope and headaches.   I have reviewed patient's Past Medical Hx, Surgical Hx, Family Hx, Social Hx, medications and  allergies.   Physical Exam   Patient Vitals for the past 24 hrs:  BP Temp Temp src Pulse Resp SpO2 Weight  12/30/17 1746 112/66 - - 90 18 100 % -  12/30/17 1259 121/67 98.1 F (36.7 C) Oral 97 18 - 158 lb (71.7 kg)   Constitutional: Well-developed, well-nourished female in no acute distress.  Cardiovascular: normal rate & rhythm. No murmurs.  Respiratory: normal effort, lung sounds clear throughout GI: Abd soft, non-tender. Pos BS x 4 MS: Extremities nontender, no edema, normal ROM Neurologic: Alert and oriented x 4.    LAB RESULTS Results for orders placed or  performed during the hospital encounter of 12/30/17 (from the past 24 hour(s))  Urinalysis, Routine w reflex microscopic     Status: Abnormal   Collection Time: 12/30/17  1:13 PM  Result Value Ref Range   Color, Urine YELLOW YELLOW   APPearance HAZY (A) CLEAR   Specific Gravity, Urine 1.033 (H) 1.005 - 1.030   pH 5.0 5.0 - 8.0   Glucose, UA NEGATIVE NEGATIVE mg/dL   Hgb urine dipstick NEGATIVE NEGATIVE   Bilirubin Urine NEGATIVE NEGATIVE   Ketones, ur NEGATIVE NEGATIVE mg/dL   Protein, ur NEGATIVE NEGATIVE mg/dL   Nitrite NEGATIVE NEGATIVE   Leukocytes, UA SMALL (A) NEGATIVE   RBC / HPF 6-10 0 - 5 RBC/hpf   WBC, UA 6-10 0 - 5 WBC/hpf   Bacteria, UA RARE (A) NONE SEEN   Squamous Epithelial / LPF 11-20 0 - 5   Mucus PRESENT   CBC     Status: Abnormal   Collection Time: 12/30/17  3:47 PM  Result Value Ref Range   WBC 12.9 (H) 4.0 - 10.5 K/uL   RBC 4.41 3.87 - 5.11 MIL/uL   Hemoglobin 13.4 12.0 - 15.0 g/dL   HCT 16.1 09.6 - 04.5 %   MCV 85.0 78.0 - 100.0 fL   MCH 30.4 26.0 - 34.0 pg   MCHC 35.7 30.0 - 36.0 g/dL   RDW 40.9 81.1 - 91.4 %   Platelets 201 150 - 400 K/uL  Comprehensive metabolic panel     Status: Abnormal   Collection Time: 12/30/17  3:47 PM  Result Value Ref Range   Sodium 137 135 - 145 mmol/L   Potassium 3.4 (L) 3.5 - 5.1 mmol/L   Chloride 103 98 - 111 mmol/L   CO2 23 22 - 32 mmol/L   Glucose,  Bld 96 70 - 99 mg/dL   BUN 12 6 - 20 mg/dL   Creatinine, Ser 7.82 0.44 - 1.00 mg/dL   Calcium 9.1 8.9 - 95.6 mg/dL   Total Protein 6.7 6.5 - 8.1 g/dL   Albumin 4.0 3.5 - 5.0 g/dL   AST 19 15 - 41 U/L   ALT 13 0 - 44 U/L   Alkaline Phosphatase 39 38 - 126 U/L   Total Bilirubin 1.0 0.3 - 1.2 mg/dL   GFR calc non Af Amer >60 >60 mL/min   GFR calc Af Amer >60 >60 mL/min   Anion gap 11 5 - 15  TSH     Status: Abnormal   Collection Time: 12/30/17  3:47 PM  Result Value Ref Range   TSH 0.028 (L) 0.350 - 4.500 uIU/mL       MAU Management/MDM: Orders Placed This Encounter  Procedures  . Urinalysis, Routine w reflex microscopic  . CBC  . Comprehensive metabolic panel  . TSH  . T3, free  . T4, free  . ED EKG  . Discharge patient    Meds ordered this encounter  Medications  . lactated ringers bolus 1,000 mL  . famotidine (PEPCID) IVPB 20 mg premix  . promethazine (PHENERGAN) injection 25 mg  . famotidine (PEPCID) 20 MG tablet    Sig: Take 1 tablet (20 mg total) by mouth at bedtime.    Dispense:  30 tablet    Refill:  0    Order Specific Question:   Supervising Provider    Answer:   Duane Lope H [[id number]]    Treatments in MAU included EKG, IV fluids, labs, antiemetic & pepcid. Normal EKG, reviewed with Dr. Ladona Ridgel (  cardiologist). TSH down to 0.028 (down from 2.376 on 6/12). Symptoms improved with fluids & pepcid.   C/w Dr. Elon Spanner. Patient to f/u with endocrinologist in the morning. She normally takes her synthroid in the AM, should defer tomorrow morning's dose until she speaks with her endocrinologist about tonight's TSH results. T3 & T4 added to her labs to have on file. Will d/c home with rx for daily pepcid.   ASSESSMENT 1. Hypothyroidism affecting pregnancy in first trimester   2. Hyperemesis gravidarum, antepartum     PLAN Discharge home Rx pepcid Call endo in AM for appropriate synthroid adjustment Home health due to come for IVF tomorrow T3 & T4  pending Discussed reasons to return to MAU Keep f/u with OB   Allergies as of 12/30/2017      Reactions   Penicillins Other (See Comments)   Reaction:  Unknown  Has patient had a PCN reaction causing immediate rash, facial/tongue/throat swelling, SOB or lightheadedness with hypotension: Unsure Has patient had a PCN reaction causing severe rash involving mucus membranes or skin necrosis: Unsure Has patient had a PCN reaction that required hospitalization Unsure Has patient had a PCN reaction occurring within the last 10 years: No If all of the above answers are "NO", then may proceed with Cephalosporin use.   Monistat 1 [tioconazole] Rash, Other (See Comments)   Yeast infection cream caused pain.      Medication List    STOP taking these medications   levothyroxine 175 MCG tablet Commonly known as:  SYNTHROID, LEVOTHROID   levothyroxine 25 MCG tablet Commonly known as:  SYNTHROID, LEVOTHROID     TAKE these medications   BONJESTA 20-20 MG Tbcr Generic drug:  Doxylamine-Pyridoxine ER Take 1 tablet by mouth 2 (two) times daily.   famotidine 20 MG tablet Commonly known as:  PEPCID Take 1 tablet (20 mg total) by mouth at bedtime.   folic acid 400 MCG tablet Commonly known as:  FOLVITE Take 1 tablet once a day   glycopyrrolate 2 MG tablet Commonly known as:  ROBINUL Take 0.5 tablets (1 mg total) by mouth 3 (three) times daily as needed.   methylPREDNISolone 4 MG tablet Commonly known as:  MEDROL Take 4 tablets (16 mg total) by mouth every 8 (eight) hours for 3 days, THEN 2 tablets (8 mg total) every 8 (eight) hours for 3 days, THEN 1 tablet (4 mg total) every 8 (eight) hours for 3 days, THEN 0.5 tablets (2 mg total) every 8 (eight) hours for 3 days. Start taking on:  12/21/2017   promethazine 25 MG tablet Commonly known as:  PHENERGAN Take 1 tablet (25 mg total) by mouth every 6 (six) hours as needed for nausea or vomiting.   pyridoxine 100 MG tablet Commonly known as:   B-6 Take 100 mg by mouth daily.   vitamin B-12 500 MCG tablet Commonly known as:  CYANOCOBALAMIN Take 500 mcg by mouth daily.        Judeth Horn, FNP-C 12/30/2017  1:12 PM

## 2017-12-30 NOTE — Progress Notes (Signed)
Redness and swelling noticeably  Decreased on area of removed IV site on Left Forearm. Erin NP observed prior to discharge.

## 2017-12-30 NOTE — MAU Note (Signed)
Chest pain since this morning, in center of chest, feels like weight on chest, felt lightheaded this AM

## 2017-12-31 LAB — T3, FREE: T3 FREE: 3.2 pg/mL (ref 2.0–4.4)

## 2018-01-07 ENCOUNTER — Other Ambulatory Visit (HOSPITAL_COMMUNITY): Payer: Self-pay | Admitting: Obstetrics and Gynecology

## 2018-01-07 DIAGNOSIS — R1115 Cyclical vomiting syndrome unrelated to migraine: Secondary | ICD-10-CM

## 2018-01-08 ENCOUNTER — Ambulatory Visit (HOSPITAL_COMMUNITY)
Admission: RE | Admit: 2018-01-08 | Discharge: 2018-01-08 | Disposition: A | Discharge status: Home / Self Care | Payer: 59 | Source: Ambulatory Visit | Attending: Obstetrics and Gynecology | Admitting: Obstetrics and Gynecology

## 2018-01-08 ENCOUNTER — Other Ambulatory Visit (HOSPITAL_COMMUNITY): Payer: Self-pay | Admitting: Obstetrics and Gynecology

## 2018-01-08 DIAGNOSIS — R1115 Cyclical vomiting syndrome unrelated to migraine: Secondary | ICD-10-CM

## 2018-01-08 DIAGNOSIS — O210 Mild hyperemesis gravidarum: Secondary | ICD-10-CM | POA: Insufficient documentation

## 2018-01-08 MED ORDER — HEPARIN SOD (PORK) LOCK FLUSH 100 UNIT/ML IV SOLN
INTRAVENOUS | Status: AC
  Filled 2018-01-08: qty 5

## 2018-01-08 MED ORDER — LIDOCAINE HCL 1 % IJ SOLN
INTRAMUSCULAR | Status: AC
  Filled 2018-01-08: qty 20

## 2018-01-08 NOTE — Discharge Instructions (Signed)
PICC Home Guide °A peripherally inserted central catheter (PICC) is a long, thin, flexible tube that is inserted into a vein in the upper arm. It is a form of intravenous (IV) access. It is considered to be a "central" line because the tip of the PICC ends in a large vein in your chest. This large vein is called the superior vena cava (SVC). The PICC tip ends in the SVC because there is a lot of blood flow in the SVC. This allows medicines and IV fluids to be quickly distributed throughout the body. The PICC is inserted using a sterile technique by a specially trained nurse or physician. After the PICC is inserted, a chest X-ray exam is done to be sure it is in the correct place. °A PICC may be placed for different reasons, such as: °· To give medicines and liquid nutrition that can only be given through a central line. Examples are: °? Certain antibiotic treatments. °? Chemotherapy. °? Total parenteral nutrition (TPN). °· To take frequent blood samples. °· To give IV fluids and blood products. °· If there is difficulty placing a peripheral intravenous (PIV) catheter. ° °If taken care of properly, a PICC can remain in place for several months. A PICC can also allow a person to go home from the hospital early. Medicine and PICC care can be managed at home by a family member or home health care team. °What problems can happen when I have a PICC? °Problems with a PICC can occasionally occur. These may include the following: °· A blood clot (thrombus) forming in or at the tip of the PICC. This can cause the PICC to become clogged. A clot-dissolving medicine called tissue plasminogen activator (tPA) can be given through the PICC to help break up the clot. °· Inflammation of the vein (phlebitis) in which the PICC is placed. Signs of inflammation may include redness, pain at the insertion site, red streaks, or being able to feel a "cord" in the vein where the PICC is located. °· Infection in the PICC or at the insertion  site. Signs of infection may include fever, chills, redness, swelling, or pus drainage from the PICC insertion site. °· PICC movement (malposition). The PICC tip may move from its original position due to excessive physical activity, forceful coughing, sneezing, or vomiting. °· A break or cut in the PICC. It is important to not use scissors near the PICC. °· Nerve or tendon irritation or injury during PICC insertion. ° °What should I keep in mind about activities when I have a PICC? °· You may bend your arm and move it freely. If your PICC is near or at the bend of your elbow, avoid activity with repeated motion at the elbow. °· Rest at home for the remainder of the day following PICC line insertion. °· Avoid lifting heavy objects as instructed by your health care provider. °· Avoid using a crutch with the arm on the same side as your PICC. You may need to use a walker. °What should I know about my PICC dressing? °· Keep your PICC bandage (dressing) clean and dry to prevent infection. °? Ask your health care provider when you may shower. Ask your health care provider to teach you how to wrap the PICC when you do take a shower. °· Change the PICC dressing as instructed by your health care provider. °· Change your PICC dressing if it becomes loose or wet. °What should I know about PICC care? °· Check the PICC insertion   site daily for leakage, redness, swelling, or pain. °· Do not take a bath, swim, or use hot tubs when you have a PICC. Cover PICC line with clear plastic wrap and tape to keep it dry while showering. °· Flush the PICC as directed by your health care provider. Let your health care provider know right away if the PICC is difficult to flush or does not flush. Do not use force to flush the PICC. °· Do not use a syringe that is less than 10 mL to flush the PICC. °· Never pull or tug on the PICC. °· Avoid blood pressure checks on the arm with the PICC. °· Keep your PICC identification card with you at all  times. °· Do not take the PICC out yourself. Only a trained clinical professional should remove the PICC. °Get help right away if: °· Your PICC is accidentally pulled all the way out. If this happens, cover the insertion site with a bandage or gauze dressing. Do not throw the PICC away. Your health care provider will need to inspect it. °· Your PICC was tugged or pulled and has partially come out. Do not  push the PICC back in. °· There is any type of drainage, redness, or swelling where the PICC enters the skin. °· You cannot flush the PICC, it is difficult to flush, or the PICC leaks around the insertion site when it is flushed. °· You hear a "flushing" sound when the PICC is flushed. °· You have pain, discomfort, or numbness in your arm, shoulder, or jaw on the same side as the PICC. °· You feel your heart "racing" or skipping beats. °· You notice a hole or tear in the PICC. °· You develop chills or a fever. °This information is not intended to replace advice given to you by your health care provider. Make sure you discuss any questions you have with your health care provider. °Document Released: 12/10/2002 Document Revised: 12/24/2015 Document Reviewed: 03/28/2013 °Elsevier Interactive Patient Education © 2017 [employer] ° °

## 2018-01-10 ENCOUNTER — Encounter (HOSPITAL_COMMUNITY): Payer: Self-pay

## 2018-01-10 ENCOUNTER — Inpatient Hospital Stay (HOSPITAL_COMMUNITY)
Admission: AD | Admit: 2018-01-10 | Discharge: 2018-01-10 | Disposition: A | Discharge status: Home / Self Care | Payer: 59 | Source: Ambulatory Visit | Attending: Obstetrics and Gynecology | Admitting: Obstetrics and Gynecology

## 2018-01-10 ENCOUNTER — Inpatient Hospital Stay (HOSPITAL_COMMUNITY): Payer: 59

## 2018-01-10 DIAGNOSIS — E039 Hypothyroidism, unspecified: Secondary | ICD-10-CM

## 2018-01-10 DIAGNOSIS — K219 Gastro-esophageal reflux disease without esophagitis: Secondary | ICD-10-CM | POA: Diagnosis not present

## 2018-01-10 DIAGNOSIS — R0602 Shortness of breath: Secondary | ICD-10-CM | POA: Diagnosis present

## 2018-01-10 DIAGNOSIS — O26891 Other specified pregnancy related conditions, first trimester: Secondary | ICD-10-CM | POA: Diagnosis present

## 2018-01-10 DIAGNOSIS — E063 Autoimmune thyroiditis: Secondary | ICD-10-CM | POA: Insufficient documentation

## 2018-01-10 DIAGNOSIS — K2100 Gastro-esophageal reflux disease with esophagitis, without bleeding: Secondary | ICD-10-CM

## 2018-01-10 DIAGNOSIS — Z880 Allergy status to penicillin: Secondary | ICD-10-CM | POA: Diagnosis not present

## 2018-01-10 DIAGNOSIS — Z3A09 9 weeks gestation of pregnancy: Secondary | ICD-10-CM | POA: Diagnosis not present

## 2018-01-10 DIAGNOSIS — Z79890 Hormone replacement therapy (postmenopausal): Secondary | ICD-10-CM | POA: Insufficient documentation

## 2018-01-10 DIAGNOSIS — O99611 Diseases of the digestive system complicating pregnancy, first trimester: Secondary | ICD-10-CM | POA: Insufficient documentation

## 2018-01-10 DIAGNOSIS — O99281 Endocrine, nutritional and metabolic diseases complicating pregnancy, first trimester: Secondary | ICD-10-CM | POA: Insufficient documentation

## 2018-01-10 DIAGNOSIS — R079 Chest pain, unspecified: Secondary | ICD-10-CM

## 2018-01-10 DIAGNOSIS — O211 Hyperemesis gravidarum with metabolic disturbance: Secondary | ICD-10-CM

## 2018-01-10 LAB — URINALYSIS, ROUTINE W REFLEX MICROSCOPIC
Bilirubin Urine: NEGATIVE
GLUCOSE, UA: NEGATIVE mg/dL
Hgb urine dipstick: NEGATIVE
Ketones, ur: 5 mg/dL — AB
Nitrite: NEGATIVE
PROTEIN: NEGATIVE mg/dL
Specific Gravity, Urine: 1.027 (ref 1.005–1.030)
pH: 6 (ref 5.0–8.0)

## 2018-01-10 MED ORDER — IOPAMIDOL (ISOVUE-370) INJECTION 76%
100.0000 mL | Freq: Once | INTRAVENOUS | Status: AC | PRN
  Administered 2018-01-10: 100 mL via INTRAVENOUS

## 2018-01-10 MED ORDER — HEPARIN SOD (PORK) LOCK FLUSH 100 UNIT/ML IV SOLN
500.0000 [IU] | Freq: Once | INTRAVENOUS | Status: AC
  Administered 2018-01-10: 500 [IU] via INTRAVENOUS
  Filled 2018-01-10: qty 5

## 2018-01-10 MED ORDER — LACTATED RINGERS IV BOLUS
1000.0000 mL | Freq: Once | INTRAVENOUS | Status: AC
  Administered 2018-01-10: 1000 mL via INTRAVENOUS

## 2018-01-10 MED ORDER — GI COCKTAIL ~~LOC~~
30.0000 mL | Freq: Once | ORAL | Status: AC
  Administered 2018-01-10: 30 mL via ORAL
  Filled 2018-01-10: qty 30

## 2018-01-10 NOTE — Discharge Instructions (Signed)
Esophagitis Esophagitis is inflammation of the esophagus. The esophagus is the tube that carries food and liquids from your mouth to your stomach. Esophagitis can cause soreness or pain in the esophagus. This condition can make it difficult and painful to swallow. What are the causes? Most causes of esophagitis are not serious. Common causes of this condition include:  Gastroesophageal reflux disease (GERD). This is when stomach contents move back up into the esophagus (reflux).  Repeated vomiting.  An allergic-type reaction, especially caused by food allergies (eosinophilic esophagitis).  Injury to the esophagus by swallowing large pills with or without water, or swallowing certain types of medicines.  Swallowing (ingesting) harmful chemicals, such as household cleaning products.  Heavy alcohol use.  An infection of the esophagus.This most often occurs in people who have a weakened immune system.  Radiation or chemotherapy treatment for cancer.  Certain diseases such as sarcoidosis, Crohn disease, and scleroderma.  What are the signs or symptoms? Symptoms of this condition include:  Difficult or painful swallowing.  Pain with swallowing acidic liquids, such as citrus juices.  Pain with burping.  Chest pain.  Difficulty breathing.  Nausea.  Vomiting.  Pain in the abdomen.  Weight loss.  Ulcers in the mouth.  Patches of white material in the mouth (candidiasis).  Fever.  Coughing up blood or vomiting blood.  Stool that is black, tarry, or bright red.  How is this diagnosed? Your health care provider will take a medical history and perform a physical exam. You may also have other tests, including:  An endoscopy to examine your stomach and esophagus with a small camera.  A test that measures the acidity level in your esophagus.  A test that measures how much pressure is on your esophagus.  A barium swallow or modified barium swallow to show the shape,  size, and functioning of your esophagus.  Allergy tests.  How is this treated? Treatment for this condition depends on the cause of your esophagitis. In some cases, steroids or other medicines may be given to help relieve your symptoms or to treat the underlying cause of your condition. You may have to make some lifestyle changes, such as:  Avoiding alcohol.  Quitting smoking.  Changing your diet.  Exercising.  Changing your sleep habits and your sleep environment.  Follow these instructions at home: Take these actions to decrease your discomfort and to help avoid complications. Diet  Follow a diet as recommended by your health care provider. This may involve avoiding foods and drinks such as: ? Coffee and tea (with or without caffeine). ? Drinks that contain alcohol. ? Energy drinks and sports drinks. ? Carbonated drinks or sodas. ? Chocolate and cocoa. ? Peppermint and mint flavorings. ? Garlic and onions. ? Horseradish. ? Spicy and acidic foods, including peppers, chili powder, curry powder, vinegar, hot sauces, and barbecue sauce. ? Citrus fruit juices and citrus fruits, such as oranges, lemons, and limes. ? Tomato-based foods, such as red sauce, chili, salsa, and pizza with red sauce. ? Fried and fatty foods, such as donuts, french fries, potato chips, and high-fat dressings. ? High-fat meats, such as hot dogs and fatty cuts of red and white meats, such as rib eye steak, sausage, ham, and bacon. ? High-fat dairy items, such as whole milk, butter, and cream cheese.  Eat small, frequent meals instead of large meals.  Avoid drinking large amounts of liquid with your meals.  Avoid eating meals during the 2-3 hours before bedtime.  Avoid lying down right   after you eat.  Do not exercise right after you eat.  Avoid foods and drinks that seem to make your symptoms worse. General instructions  Pay attention to any changes in your symptoms.  Take over-the-counter and  prescription medicines only as told by your health care provider. Do not take aspirin, ibuprofen, or other NSAIDs unless your health care provider told you to do so.  If you have trouble taking pills, use a pill splitter to decrease the size of the pill. This will decrease the chance of the pill getting stuck or injuring your esophagus on the way down. Also, drink water after you take a pill.  Do not use any tobacco products, including cigarettes, chewing tobacco, and e-cigarettes. If you need help quitting, ask your health care provider.  Wear loose-fitting clothing. Do not wear anything tight around your waist that causes pressure on your abdomen.  Raise (elevate) the head of your bed about 6 inches (15 cm).  Try to reduce your stress, such as with yoga or meditation. If you need help reducing stress, ask your health care provider.  If you are overweight, reduce your weight to an amount that is healthy for you. Ask your health care provider for guidance about a safe weight loss goal.  Keep all follow-up visits as told by your health care provider. This is important. Contact a health care provider if:  You have new symptoms.  You have unexplained weight loss.  You have difficulty swallowing, or it hurts to swallow.  You have wheezing or a persistent cough.  Your symptoms do not improve with treatment.  You have frequent heartburn for more than two weeks. Get help right away if:  You have severe pain in your arms, neck, jaw, teeth, or back.  You feel sweaty, dizzy, or light-headed.  You have chest pain or shortness of breath.  You vomit and your vomit looks like blood or coffee grounds.  Your stool is bloody or black.  You have a fever.  You cannot swallow, drink, or eat. This information is not intended to replace advice given to you by your health care provider. Make sure you discuss any questions you have with your health care provider. Document Released: 07/13/2004  Document Revised: 11/11/2015 Document Reviewed: 09/30/2014 Elsevier Interactive Patient Education  2018 Elsevier Inc.     

## 2018-01-10 NOTE — MAU Provider Note (Signed)
History     CSN: [id number]  Arrival date and time: 01/10/18 1251   First Provider Initiated Contact with Patient 01/10/18 1405      Chief Complaint  Patient presents with  . Shortness of Breath  . Chest Pain   Heather [name] is a 31 y.o. G4P1021 at [redacted]w[redacted]d followed for hyperemesis and hypothyroidism presenting with sudden onset at rest 3 hours PTA of pleuritic chest pain.  She describes sharp substernal pain associated with shortness of breath on shallow and deep inspiration.  The pain radiates to left and right neck she has had "undiagnosed chest pain" for 4 years but states the current pain is completely dissimilar.  She denies cough, dyspnea on exertion, orthopnea, hemoptysis, or leg pain.  She denies indigestion or heartburn and says this pain is different from GI pain. She had a PICC line inserted in her left arm 2 days ago.  She has done no home treatment for the pain. Declines any pain med now.  Continues to have persistent daily vomiting.  Had home IV fluids through PICC line yesterday.  Synthroid dosage was adjusted by her endocrinologist after last visit and follow-up is scheduled.    OB History  Gravida Para Term Preterm AB Living  4 1 1   2 1   SAB TAB Ectopic Multiple Live Births  2     0 1    # Outcome Date GA Lbr Len/2nd Weight Sex Delivery Anes PTL Lv  4 Current           3 Term 12/28/15 [redacted]w[redacted]d / 02:34 8 lb 11 oz (3.94 kg) F Vag-Spont EPI, Local  LIV  2 SAB           1 SAB              Past Medical History:  Diagnosis Date  . Anginal pain (HCC)    cardiac workup normal  . Anxiety   . Chlamydia    in college  . Chronic kidney disease    hx kidney stone  . Depression   . GERD (gastroesophageal reflux disease)    with pregnancy  . Hashimoto's disease   . Headache    migraines  . History of ovarian cyst    ruptured  . Hx of varicella   . Hypothyroidism    borderline    Past Surgical History:  Procedure Laterality Date  . DILATION AND EVACUATION N/A  08/27/2014   Procedure: DILATATION AND EVACUATION WITH CHROMOSOMES STUDES, ULTRASOUND;  Surgeon: Harold Hedge, MD;  Location: [facility];  Service: Gynecology;  Laterality: N/A;  . LAPAROSCOPIC APPENDECTOMY N/A 01/13/2014   Procedure: APPENDECTOMY LAPAROSCOPIC;  Surgeon: Cherylynn Ridges, MD;  Location: [facility];  Service: General;  Laterality: N/A;  . TOOTH EXTRACTION      Family History  Problem Relation Age of Onset  . Hypertension Mother   . Diabetes Mother   . Cancer Maternal Grandmother   . Cancer Maternal Grandfather   . Rheum arthritis Maternal Grandfather     Social History   Tobacco Use  . Smoking status: Never Smoker  . Smokeless tobacco: Never Used  Substance Use Topics  . Alcohol use: Not Currently  . Drug use: No    Allergies:  Allergies  Allergen Reactions  . Penicillins Other (See Comments)    Reaction:  Unknown  Has patient had a PCN reaction causing immediate rash, facial/tongue/throat swelling, SOB or lightheadedness with hypotension: Unsure Has patient had a PCN reaction causing severe rash  involving mucus membranes or skin necrosis: Unsure Has patient had a PCN reaction that required hospitalization Unsure Has patient had a PCN reaction occurring within the last 10 years: No If all of the above answers are "NO", then may proceed with Cephalosporin use.  . Monistat 1 [Tioconazole] Rash and Other (See Comments)    Yeast infection cream caused pain.    Medications Prior to Admission  Medication Sig Dispense Refill Last Dose  . BONJESTA 20-20 MG TBCR Take 1 tablet by mouth 2 (two) times daily.   3 12/21/2017 at Unknown time  . famotidine (PEPCID) 20 MG tablet Take 1 tablet (20 mg total) by mouth at bedtime. 30 tablet 0   . folic acid (FOLVITE) 400 MCG tablet Take 1 tablet once a day   12/18/2017  . glycopyrrolate (ROBINUL) 2 MG tablet Take 0.5 tablets (1 mg total) by mouth 3 (three) times daily as needed. 60 tablet 0   . promethazine (PHENERGAN) 25 MG tablet Take 1  tablet (25 mg total) by mouth every 6 (six) hours as needed for nausea or vomiting. 30 tablet 0   . pyridoxine (B-6) 100 MG tablet Take 100 mg by mouth daily.   12/18/2017  . vitamin B-12 (CYANOCOBALAMIN) 500 MCG tablet Take 500 mcg by mouth daily.   12/18/2017    Review of Systems  Constitutional: Negative for chills and fever.  HENT: Negative for congestion.   Respiratory: Positive for shortness of breath. Negative for cough, chest tightness, wheezing and stridor.   Cardiovascular: Positive for chest pain. Negative for palpitations.  Gastrointestinal: Positive for nausea and vomiting. Negative for abdominal pain, constipation and diarrhea.  Genitourinary: Positive for decreased urine volume. Negative for dysuria, flank pain, hematuria, urgency, vaginal bleeding, vaginal discharge and vaginal pain.  Psychiatric/Behavioral: The patient is nervous/anxious.    Physical Exam   Blood pressure 104/74, pulse (!) 108, temperature 98.6 F (37 C), temperature source Oral, resp. rate 18, weight 157 lb (71.2 kg), last menstrual period 11/05/2017, SpO2 99 %.  Physical Exam  Nursing note and vitals reviewed. Constitutional: She is oriented to person, place, and time. She appears well-developed and well-nourished. She appears distressed.  Appears uncomfortable and initially breathing shallowly  HENT:  Head: Normocephalic.  Eyes: Pupils are equal, round, and reactive to light.  Neck: Neck supple.  Cardiovascular: Normal heart sounds.  Tachycardic  Respiratory: Effort normal and breath sounds normal.  GI: Soft. There is no tenderness.  Musculoskeletal: Normal range of motion.  Neurological: She is alert and oriented to person, place, and time.  Skin: Skin is warm and dry.  Psychiatric: She has a normal mood and affect. Her behavior is normal.    MAU Course  Procedures  Results for orders placed or performed during the hospital encounter of 01/10/18 (from the past 24 hour(s))  Urinalysis, Routine  w reflex microscopic     Status: Abnormal   Collection Time: 01/10/18  2:17 PM  Result Value Ref Range   Color, Urine YELLOW YELLOW   APPearance CLOUDY (A) CLEAR   Specific Gravity, Urine 1.027 1.005 - 1.030   pH 6.0 5.0 - 8.0   Glucose, UA NEGATIVE NEGATIVE mg/dL   Hgb urine dipstick NEGATIVE NEGATIVE   Bilirubin Urine NEGATIVE NEGATIVE   Ketones, ur 5 (A) NEGATIVE mg/dL   Protein, ur NEGATIVE NEGATIVE mg/dL   Nitrite NEGATIVE NEGATIVE   Leukocytes, UA MODERATE (A) NEGATIVE   RBC / HPF 0-5 0 - 5 RBC/hpf   WBC, UA 11-20 0 -  5 WBC/hpf   Bacteria, UA RARE (A) NONE SEEN   Squamous Epithelial / LPF 11-20 0 - 5   Mucus PRESENT   Urine culture sent  EKG: sinus tachycardia HR 110 with nonspecific ST abnormality. C/W Dr. Mayford Knife, Cardiology, who reviewed and is not concerned  C/W Dr. Elon Spanner who advises CT to r/o PE. 1540: Anesthesia here to attempt peripheral IV for contrast dye but unsuccessful. Will bolus LR 1000 via PICC line.  .Ct Angio Chest Pe W And/or Wo Contrast  Result Date: 01/10/2018 CLINICAL DATA:  Pleuritic chest pain rule out pulmonary embolism. Nine weeks pregnant. Patient shielded for exam. EXAM: CT ANGIOGRAPHY CHEST WITH CONTRAST TECHNIQUE: Multidetector CT imaging of the chest was performed using the standard protocol during bolus administration of intravenous contrast. Multiplanar CT image reconstructions and MIPs were obtained to evaluate the vascular anatomy. CONTRAST:  ISOVUE-370 IOPAMIDOL (ISOVUE-370) INJECTION 76% COMPARISON:  None. FINDINGS: Cardiovascular: Negative for pulmonary embolism. Pulmonary arteries normal in caliber. Thoracic aorta normal. Heart appears normal. Mediastinum/Nodes: Negative.  Left arm PICC tip in the SVC. Lungs/Pleura: Lungs are clear without infiltrate or effusion. Upper Abdomen: Negative Musculoskeletal: Negative Review of the MIP images confirms the above findings. IMPRESSION: Normal CTA chest.  Negative for pulmonary embolism.  Electronically Signed   By: Marlan Palau M.D.   On: 01/10/2018 16:46   GI cocktail given> pain improved and nausea much better  After IV LR 1000 infused,PICC line flushed with NS and heparin 500 units  Advised to start Riopan or Mylanta to add to outpatient regimen. Reassured that CP not of cardiac origin.   C/W Dr. Elon Spanner regarding symptoms, VS, lab and imaging results> will discharge home with close F/U.  Assessment and Plan  G4P1021 at [facility] 1. Chest pain due to GERD   2. Esophagitis, reflux    Allergies as of 01/10/2018      Reactions   Penicillins Other (See Comments)   Reaction:  Unknown  Has patient had a PCN reaction causing immediate rash, facial/tongue/throat swelling, SOB or lightheadedness with hypotension: Unsure Has patient had a PCN reaction causing severe rash involving mucus membranes or skin necrosis: Unsure Has patient had a PCN reaction that required hospitalization Unsure Has patient had a PCN reaction occurring within the last 10 years: No If all of the above answers are "NO", then may proceed with Cephalosporin use.   Monistat 1 [tioconazole] Rash, Other (See Comments)   Yeast infection cream caused pain.      Medication List    TAKE these medications   BONJESTA 20-20 MG Tbcr Generic drug:  Doxylamine-Pyridoxine ER Take 1 tablet by mouth 2 (two) times daily.   famotidine 20 MG tablet Commonly known as:  PEPCID Take 1 tablet (20 mg total) by mouth at bedtime.   folic acid 400 MCG tablet Commonly known as:  FOLVITE Take 1 tablet once a day   HEPARIN LOCK FLUSH IV Inject 1 each into the vein daily.   ondansetron 4 MG disintegrating tablet Commonly known as:  ZOFRAN-ODT Take 4 mg by mouth every 6 (six) hours as needed for nausea or vomiting.   promethazine 25 MG tablet Commonly known as:  PHENERGAN Take 1 tablet (25 mg total) by mouth every 6 (six) hours as needed for nausea or vomiting.   SYNTHROID 150 MCG tablet Generic drug:   levothyroxine Take 150 mcg by mouth daily.      Follow-up Information    Ranae Pila, MD Follow up on 01/15/2018.  Specialty:  Obstetrics and Gynecology Contact information: [address] [place] [address] [phone]           Caren Griffins CNM 01/10/2018, 2:41 PM

## 2018-01-10 NOTE — MAU Note (Signed)
Pt C/O mid chest pain for the last 2 hours, hurts to take a deep breath.  Has PICC line in L arm for hyperemesis, vomiting today.  Denies bleeding.

## 2018-01-11 LAB — URINE CULTURE

## 2018-01-13 ENCOUNTER — Emergency Department (HOSPITAL_COMMUNITY)
Admission: EM | Admit: 2018-01-13 | Discharge: 2018-01-14 | Disposition: A | Discharge status: Home / Self Care | Payer: 59 | Attending: Emergency Medicine | Admitting: Emergency Medicine

## 2018-01-13 ENCOUNTER — Encounter (HOSPITAL_COMMUNITY): Payer: Self-pay | Admitting: Nurse Practitioner

## 2018-01-13 DIAGNOSIS — O219 Vomiting of pregnancy, unspecified: Secondary | ICD-10-CM | POA: Diagnosis not present

## 2018-01-13 DIAGNOSIS — N189 Chronic kidney disease, unspecified: Secondary | ICD-10-CM | POA: Insufficient documentation

## 2018-01-13 DIAGNOSIS — O9989 Other specified diseases and conditions complicating pregnancy, childbirth and the puerperium: Secondary | ICD-10-CM | POA: Insufficient documentation

## 2018-01-13 DIAGNOSIS — T82898A Other specified complication of vascular prosthetic devices, implants and grafts, initial encounter: Secondary | ICD-10-CM | POA: Insufficient documentation

## 2018-01-13 DIAGNOSIS — Y69 Unspecified misadventure during surgical and medical care: Secondary | ICD-10-CM | POA: Insufficient documentation

## 2018-01-13 DIAGNOSIS — E039 Hypothyroidism, unspecified: Secondary | ICD-10-CM | POA: Diagnosis not present

## 2018-01-13 DIAGNOSIS — Z3A10 10 weeks gestation of pregnancy: Secondary | ICD-10-CM | POA: Insufficient documentation

## 2018-01-13 DIAGNOSIS — Z79899 Other long term (current) drug therapy: Secondary | ICD-10-CM | POA: Insufficient documentation

## 2018-01-13 MED ORDER — LACTATED RINGERS IV BOLUS
1000.0000 mL | Freq: Once | INTRAVENOUS | Status: AC
  Administered 2018-01-13: 1000 mL via INTRAVENOUS

## 2018-01-13 MED ORDER — ONDANSETRON HCL 4 MG/2ML IJ SOLN
4.0000 mg | Freq: Once | INTRAMUSCULAR | Status: AC
  Administered 2018-01-13: 4 mg via INTRAVENOUS
  Filled 2018-01-13: qty 2

## 2018-01-13 NOTE — ED Triage Notes (Signed)
Pt is c/o pain in her left arm PICC line that she had placed 5 days ago. She was sent in by home health for trouble shooting.

## 2018-01-13 NOTE — ED Provider Notes (Signed)
[facility]-EMERGENCY DEPT Provider Note   CSN: [id number] Arrival date & time: 01/13/18  1959     History   Chief Complaint No chief complaint on file.   HPI Heather [name] is a 32 y.o. female.  The history is provided by the patient and medical records.     32 y.o. F G4P1 approx [redacted] weeks gestation with history of anxiety, chronic kidney disease, depression, Hashimoto's, migraine headaches, ptyalism, here with issues with her PICC line.  Patient had PICC line placed in left upper arm 5 days ago to allow for IV fluids at home secondary to hyperemesis gravidarum.  She has had this in the past with prior pregnancies.  She gets a liter of LR every other day as well as Zofran.  States today she started having issues with her PICC line, mostly pain in the left upper arm.  She has not had any redness, swelling, or bruising.  No fever or chills.  Does report there is been an issue with 1 of the catheters since insertion because it would not flush.  She is continued to have intermittent nausea and vomiting, some mild relief from her home Zofran.  She last took it about an hour ago but vomited it up right after.  States she did get a liter of fluids this morning.  They are trying to increase it to where she can get fluids every day at home.  Past Medical History:  Diagnosis Date  . Anginal pain (HCC)    cardiac workup normal  . Anxiety   . Chlamydia    in college  . Chronic kidney disease    hx kidney stone  . Depression   . GERD (gastroesophageal reflux disease)    with pregnancy  . Hashimoto's disease   . Headache    migraines  . History of ovarian cyst    ruptured  . Hx of varicella   . Hypothyroidism    borderline    Patient Active Problem List   Diagnosis Date Noted  . Pelvic pain 04/09/2017  . Candida vaginitis 04/09/2017  . Pregnancy 12/28/2015  . Hydronephrosis determined by ultrasound 07/06/2015  . Hyperemesis arising during pregnancy 08/19/2014    . Hyperemesis affecting pregnancy, antepartum 08/12/2014  . Appendicitis, acute 01/12/2014    Past Surgical History:  Procedure Laterality Date  . DILATION AND EVACUATION N/A 08/27/2014   Procedure: DILATATION AND EVACUATION WITH CHROMOSOMES STUDES, ULTRASOUND;  Surgeon: Harold Hedge, MD;  Location: [facility] ORS;  Service: Gynecology;  Laterality: N/A;  . LAPAROSCOPIC APPENDECTOMY N/A 01/13/2014   Procedure: APPENDECTOMY LAPAROSCOPIC;  Surgeon: Cherylynn Ridges, MD;  Location: [facility] OR;  Service: General;  Laterality: N/A;  . TOOTH EXTRACTION       OB History    Gravida  4   Para  1   Term  1   Preterm      AB  2   Living  1     SAB  2   TAB      Ectopic      Multiple  0   Live Births  1            Home Medications    Prior to Admission medications   Medication Sig Start Date End Date Taking? Authorizing Provider  BONJESTA 20-20 MG TBCR Take 1 tablet by mouth 2 (two) times daily.  12/05/17   [provider]  famotidine (PEPCID) 20 MG tablet Take 1 tablet (20 mg total) by mouth  at bedtime. 12/30/17   Judeth HornLawrence, Erin, NP  folic acid (FOLVITE) 400 MCG tablet Take 1 tablet once a day    [provider]  HEPARIN LOCK FLUSH IV Inject 1 each into the vein daily.    [provider]  ondansetron (ZOFRAN-ODT) 4 MG disintegrating tablet Take 4 mg by mouth every 6 (six) hours as needed for nausea or vomiting.  01/04/18   [provider]  promethazine (PHENERGAN) 25 MG tablet Take 1 tablet (25 mg total) by mouth every 6 (six) hours as needed for nausea or vomiting. 12/21/17   Judeth HornLawrence, Erin, NP  SYNTHROID 150 MCG tablet Take 150 mcg by mouth daily. 12/31/17   [provider]    Family History Family History  Problem Relation Age of Onset  . Hypertension Mother   . Diabetes Mother   . Cancer Maternal Grandmother   . Cancer Maternal Grandfather   . Rheum arthritis Maternal Grandfather     Social History Social History   Tobacco Use  .  Smoking status: Never Smoker  . Smokeless tobacco: Never Used  Substance Use Topics  . Alcohol use: Not Currently  . Drug use: No     Allergies   Penicillins and Monistat 1 [tioconazole]   Review of Systems Review of Systems  Gastrointestinal: Positive for nausea and vomiting.  Skin:       PICC line pain  All other systems reviewed and are negative.    Physical Exam Updated Vital Signs BP 112/77 (BP Location: Left Arm)   Pulse 98   Temp 98.8 F (37.1 C) (Oral)   Resp 18   Ht 5\' 9"  (1.753 m)   Wt 71.2 kg (157 lb)   LMP 11/05/2017   SpO2 98%   BMI 23.18 kg/m   Physical Exam  Constitutional: She is oriented to person, place, and time. She appears well-developed and well-nourished.  HENT:  Head: Normocephalic and atraumatic.  Mouth/Throat: Oropharynx is clear and moist.  Dry mucous membranes, intermittently spitting during exam  Eyes: Pupils are equal, round, and reactive to light. Conjunctivae and EOM are normal.  Neck: Normal range of motion.  Cardiovascular: Normal rate, regular rhythm and normal heart sounds.  Pulmonary/Chest: Effort normal and breath sounds normal. No stridor. No respiratory distress.  Abdominal: Soft. Bowel sounds are normal. There is no tenderness. There is no rebound.  Musculoskeletal: Normal range of motion.  PICC line left upper arm; no swelling or bruising; no redness or streaking  Neurological: She is alert and oriented to person, place, and time.  Skin: Skin is warm and dry.  Psychiatric: She has a normal mood and affect.  Nursing note and vitals reviewed.    ED Treatments / Results  Labs (all labs ordered are listed, but only abnormal results are displayed) Labs Reviewed - No data to display  EKG None  Radiology No results found.  Procedures Procedures (including critical care time)  Medications Ordered in ED Medications  lactated ringers bolus 1,000 mL (0 mLs Intravenous Stopped 01/13/18 2359)  ondansetron (ZOFRAN)  injection 4 mg (4 mg Intravenous Given 01/13/18 2247)  heparin lock flush 100 unit/mL (500 Units Intracatheter Given 01/14/18 0045)     Initial Impression / Assessment and Plan / ED Course  I have reviewed the triage vital signs and the nursing notes.  Pertinent labs & imaging results that were available during my care of the patient were reviewed by me and considered in my medical decision making (see chart for details).  32 y.o. F here for evaluation of PICC line.  This was placed a few days ago to allow for IVF at home due to hyperemesis gravidarum.  PICC site appears clean, no signs of infection.  Arm appears normal without signs of DVT or lymphangitis.   IV team has assessed, changed out one of the caps and now both catheters are flushing without difficulty.  No further pain at this time.  Patient is intermittently spitting during exam, still appears very nauseated and clinically dry.  Since she is here and IV line is working correctly now, will go ahead and give her liter of LR and IV zofran.  Patient will be d/c home to FU closely with her OB-GYN.  Discussed plan with patient, she acknowledged understanding and agreed with plan of care.  Return precautions given for new or worsening symptoms.  Final Clinical Impressions(s) / ED Diagnoses   Final diagnoses:  Occlusion of peripherally inserted central catheter (PICC) line, initial encounter [facility])    ED Discharge Orders    None       Garlon Hatchet, PA-C 01/14/18 1610    Gwyneth Sprout, MD 01/15/18 1454

## 2018-01-14 MED ORDER — HEPARIN SOD (PORK) LOCK FLUSH 100 UNIT/ML IV SOLN
500.0000 [IU] | Freq: Once | INTRAVENOUS | Status: AC
  Administered 2018-01-14: 500 [IU]
  Filled 2018-01-14: qty 5

## 2018-01-14 NOTE — Discharge Instructions (Signed)
Keep an eye on your PICC line, monitor for redness/swelling. Follow-up with your OB-GYN closely. [facility] new[facility]acute changes.

## 2018-01-19 ENCOUNTER — Inpatient Hospital Stay (HOSPITAL_COMMUNITY)
Admission: AD | Admit: 2018-01-19 | Discharge: 2018-01-19 | Disposition: A | Discharge status: Home / Self Care | Payer: 59 | Source: Ambulatory Visit | Attending: Obstetrics and Gynecology | Admitting: Obstetrics and Gynecology

## 2018-01-19 ENCOUNTER — Encounter (HOSPITAL_COMMUNITY): Payer: Self-pay | Admitting: *Deleted

## 2018-01-19 DIAGNOSIS — K219 Gastro-esophageal reflux disease without esophagitis: Secondary | ICD-10-CM | POA: Insufficient documentation

## 2018-01-19 DIAGNOSIS — O9989 Other specified diseases and conditions complicating pregnancy, childbirth and the puerperium: Secondary | ICD-10-CM | POA: Diagnosis not present

## 2018-01-19 DIAGNOSIS — Z3A10 10 weeks gestation of pregnancy: Secondary | ICD-10-CM | POA: Diagnosis not present

## 2018-01-19 DIAGNOSIS — Z9689 Presence of other specified functional implants: Secondary | ICD-10-CM | POA: Insufficient documentation

## 2018-01-19 DIAGNOSIS — Z87442 Personal history of urinary calculi: Secondary | ICD-10-CM | POA: Diagnosis not present

## 2018-01-19 DIAGNOSIS — E063 Autoimmune thyroiditis: Secondary | ICD-10-CM | POA: Insufficient documentation

## 2018-01-19 DIAGNOSIS — O99341 Other mental disorders complicating pregnancy, first trimester: Secondary | ICD-10-CM | POA: Insufficient documentation

## 2018-01-19 DIAGNOSIS — F419 Anxiety disorder, unspecified: Secondary | ICD-10-CM | POA: Diagnosis not present

## 2018-01-19 DIAGNOSIS — R1011 Right upper quadrant pain: Secondary | ICD-10-CM | POA: Insufficient documentation

## 2018-01-19 DIAGNOSIS — F329 Major depressive disorder, single episode, unspecified: Secondary | ICD-10-CM | POA: Diagnosis not present

## 2018-01-19 DIAGNOSIS — O211 Hyperemesis gravidarum with metabolic disturbance: Secondary | ICD-10-CM | POA: Insufficient documentation

## 2018-01-19 DIAGNOSIS — N83209 Unspecified ovarian cyst, unspecified side: Secondary | ICD-10-CM | POA: Diagnosis not present

## 2018-01-19 DIAGNOSIS — N189 Chronic kidney disease, unspecified: Secondary | ICD-10-CM | POA: Diagnosis not present

## 2018-01-19 DIAGNOSIS — R109 Unspecified abdominal pain: Secondary | ICD-10-CM | POA: Diagnosis not present

## 2018-01-19 DIAGNOSIS — O26891 Other specified pregnancy related conditions, first trimester: Secondary | ICD-10-CM | POA: Diagnosis present

## 2018-01-19 DIAGNOSIS — Z880 Allergy status to penicillin: Secondary | ICD-10-CM | POA: Diagnosis not present

## 2018-01-19 DIAGNOSIS — O99281 Endocrine, nutritional and metabolic diseases complicating pregnancy, first trimester: Secondary | ICD-10-CM | POA: Diagnosis not present

## 2018-01-19 DIAGNOSIS — O99611 Diseases of the digestive system complicating pregnancy, first trimester: Secondary | ICD-10-CM | POA: Insufficient documentation

## 2018-01-19 LAB — URINALYSIS, ROUTINE W REFLEX MICROSCOPIC
BILIRUBIN URINE: NEGATIVE
Glucose, UA: NEGATIVE mg/dL
Hgb urine dipstick: NEGATIVE
Ketones, ur: 5 mg/dL — AB
NITRITE: NEGATIVE
Protein, ur: NEGATIVE mg/dL
SPECIFIC GRAVITY, URINE: 1.019 (ref 1.005–1.030)
pH: 8 (ref 5.0–8.0)

## 2018-01-19 LAB — CBC WITH DIFFERENTIAL/PLATELET
Basophils Absolute: 0 10*3/uL (ref 0.0–0.1)
Basophils Relative: 0 %
Eosinophils Absolute: 0.1 10*3/uL (ref 0.0–0.7)
Eosinophils Relative: 2 %
HCT: 31 % — ABNORMAL LOW (ref 36.0–46.0)
Hemoglobin: 11.6 g/dL — ABNORMAL LOW (ref 12.0–15.0)
Lymphocytes Relative: 39 %
Lymphs Abs: 2.3 10*3/uL (ref 0.7–4.0)
MCH: 30.2 pg (ref 26.0–34.0)
MCHC: 37.4 g/dL — AB (ref 30.0–36.0)
MCV: 80.7 fL (ref 78.0–100.0)
MONOS PCT: 6 %
Monocytes Absolute: 0.3 10*3/uL (ref 0.1–1.0)
NEUTROS PCT: 53 %
Neutro Abs: 3.3 10*3/uL (ref 1.7–7.7)
Platelets: 188 10*3/uL (ref 150–400)
RBC: 3.84 MIL/uL — AB (ref 3.87–5.11)
RDW: 12.8 % (ref 11.5–15.5)
WBC: 6.1 10*3/uL (ref 4.0–10.5)

## 2018-01-19 NOTE — MAU Note (Signed)
Annitta JerseySamantha Canavan is a 32 y.o. at 1617w5d here in MAU reporting:  +lower right abdominal pain Sometimes radiates to back Constant; sharp States feels similar to when she had kidney stones in the past   Onset of complaint: started last night Pain score: 8/10 Vitals:   01/19/18 1631  BP: 108/73  Pulse: 97  Resp: 18  Temp: 98.6 F (37 C)  SpO2: 100%   Lab orders placed from triage: ua

## 2018-01-19 NOTE — MAU Provider Note (Signed)
History   4P102 110.5 weeks and today with abdominal pain that is on her right side and about 4 cm above the umbilicus.  Patient has a PICC line in place for hyperemesis she received her IV fluids this morning without any problems.  She states the pain started last night this is been a 9 problem before at that time she had a kidney stone and the patient is verbalizing concern it might be that again.  She denies any vaginal bleeding.  Patient has had an appendectomy in the past.  CSN: [id number]  Arrival date & time 01/19/18  1618   None     Chief Complaint  Patient presents with  . Abdominal Pain  . Emesis    HPI  Past Medical History:  Diagnosis Date  . Anginal pain (HCC)    cardiac workup normal  . Anxiety   . Chlamydia    in college  . Chronic kidney disease    hx kidney stone  . Depression   . GERD (gastroesophageal reflux disease)    with pregnancy  . Hashimoto's disease   . Headache    migraines  . History of ovarian cyst    ruptured  . Hx of varicella   . Hypothyroidism    borderline    Past Surgical History:  Procedure Laterality Date  . DILATION AND EVACUATION N/A 08/27/2014   Procedure: DILATATION AND EVACUATION WITH CHROMOSOMES STUDES, ULTRASOUND;  Surgeon: Harold HedgeJames Tomblin, MD;  Location: [facility] OR[facility];  Service: Gynecology;  Laterality: N/A;  . LAPAROSCOPIC APPENDECTOMY N/A 01/13/2014   Procedure: APPENDECTOMY LAPAROSCOPIC;  Surgeon: Cherylynn RidgesJames O Wyatt, MD;  Location: [facility] OR;  Service: General;  Laterality: N/A;  . TOOTH EXTRACTION      Family History  Problem Relation Age of Onset  . Hypertension Mother   . Diabetes Mother   . Cancer Maternal Grandmother   . Cancer Maternal Grandfather   . Rheum arthritis Maternal Grandfather     Social History   Tobacco Use  . Smoking status: Never Smoker  . Smokeless tobacco: Never Used  Substance Use Topics  . Alcohol use: Not Currently  . Drug use: No    OB History    Gravida  4   Para  1   Term  1   Preterm     AB  2   Living  1     SAB  2   TAB      Ectopic      Multiple  0   Live Births  1           Review of Systems  Constitutional: Negative.   HENT: Negative.   Eyes: Negative.   Respiratory: Negative.   Gastrointestinal: Positive for abdominal pain, nausea and vomiting.  Endocrine: Negative.   Genitourinary: Negative.   Musculoskeletal: Negative.   Skin: Negative.   Allergic/Immunologic: Negative.   Neurological: Negative.   Hematological: Negative.   Psychiatric/Behavioral: Negative.     Allergies  Penicillins and Monistat 1 [tioconazole]  Home Medications    BP 108/73 (BP Location: Right Arm)   Pulse 97   Temp 98.6 F (37 C) (Oral)   Resp 18   Wt 156 lb 0.8 oz (70.8 kg)   LMP 11/05/2017   SpO2 100% Comment: ra  BMI 23.04 kg/m   Physical Exam  Constitutional: She is oriented to person, place, and time. She appears well-developed and well-nourished.  HENT:  Head: Normocephalic.  Cardiovascular: Normal rate, regular rhythm, normal heart sounds  and intact distal pulses.  Pulmonary/Chest: Effort normal and breath sounds normal.  Abdominal: Normal appearance and bowel sounds are normal.  Neurological: She is alert and oriented to person, place, and time.  Skin: Skin is warm and dry.  Psychiatric: She has a normal mood and affect. Her behavior is normal.    MAU Course  Procedures (including critical care time)  Labs Reviewed  URINALYSIS, ROUTINE W REFLEX MICROSCOPIC - Abnormal; Notable for the following components:      Result Value   APPearance CLOUDY (*)    Ketones, ur 5 (*)    Leukocytes, UA SMALL (*)    Bacteria, UA RARE (*)    All other components within normal limits  CBC WITH DIFFERENTIAL/PLATELET   No results found. Results for orders placed or performed during the hospital encounter of 01/19/18 (from the past 24 hour(s))  Urinalysis, Routine w reflex microscopic     Status: Abnormal   Collection Time: 01/19/18  4:37 PM  Result  Value Ref Range   Color, Urine YELLOW YELLOW   APPearance CLOUDY (A) CLEAR   Specific Gravity, Urine 1.019 1.005 - 1.030   pH 8.0 5.0 - 8.0   Glucose, UA NEGATIVE NEGATIVE mg/dL   Hgb urine dipstick NEGATIVE NEGATIVE   Bilirubin Urine NEGATIVE NEGATIVE   Ketones, ur 5 (A) NEGATIVE mg/dL   Protein, ur NEGATIVE NEGATIVE mg/dL   Nitrite NEGATIVE NEGATIVE   Leukocytes, UA SMALL (A) NEGATIVE   RBC / HPF 0-5 0 - 5 RBC/hpf   Bacteria, UA RARE (A) NONE SEEN   Squamous Epithelial / LPF 11-20 0 - 5   Mucus PRESENT    Amorphous Crystal PRESENT     1. Abdominal pain in pregnancy, first trimester       MDM  Vital signs are stable.  Fetal heart tone is strong regular per Doppler.  Urinalysis is normal with small amount of ketones present.  PICC line is intact no redness swelling or tenderness noted at site.  POC discussed with Dr. Henderson Cloud pt to be d/c home to manage discomfort with tylenol.

## 2018-01-29 NOTE — H&P (Signed)
Heather [name] is an 32 y.o. female. U/S in office 01/29/18 notes CRL=11 6/7weeks and no FHT. Pregnancy complicated by hyperemesis gravidarum requiring PICC line for IV fluids, Zofran and outpatient and in patient steroid protocol.  Pertinent Gynecological History: Menses: N/A Bleeding: N/A Contraception: none DES exposure: denies Blood transfusions: none Sexually transmitted diseases: no past history Previous GYN Procedures: DNC  Last mammogram: N/A Date: N/A Last pap: normal Date: 7/19 OB History: G4, P1   Menstrual History: Menarche age: unknown Patient's last menstrual period was 11/05/2017.    Past Medical History:  Diagnosis Date  . Anginal pain (HCC)    cardiac workup normal  . Anxiety   . Chlamydia    in college  . Chronic kidney disease    hx kidney stone  . Depression   . GERD (gastroesophageal reflux disease)    with pregnancy  . Hashimoto's disease   . Headache    migraines  . History of ovarian cyst    ruptured  . Hx of varicella   . Hypothyroidism    borderline    Past Surgical History:  Procedure Laterality Date  . DILATION AND EVACUATION N/A 08/27/2014   Procedure: DILATATION AND EVACUATION WITH CHROMOSOMES STUDES, ULTRASOUND;  Surgeon: Harold HedgeJames Luisangel Wainright, MD;  Location: [facility] OR[facility];  Service: Gynecology;  Laterality: N/A;  . LAPAROSCOPIC APPENDECTOMY N/A 01/13/2014   Procedure: APPENDECTOMY LAPAROSCOPIC;  Surgeon: Cherylynn RidgesJames O Wyatt, MD;  Location: [facility] OR;  Service: General;  Laterality: N/A;  . TOOTH EXTRACTION      Family History  Problem Relation Age of Onset  . Hypertension Mother   . Diabetes Mother   . Cancer Maternal Grandmother   . Cancer Maternal Grandfather   . Rheum arthritis Maternal Grandfather     Social History:  reports that she has never smoked. She has never used smokeless tobacco. She reports that she drank alcohol. She reports that she does not use drugs.  Allergies:  Allergies  Allergen Reactions  . Penicillins Other (See Comments)    Reaction:  Unknown  Has patient had a PCN reaction causing immediate rash, facial/tongue/throat swelling, SOB or lightheadedness with hypotension: Unsure Has patient had a PCN reaction causing severe rash involving mucus membranes or skin necrosis: Unsure Has patient had a PCN reaction that required hospitalization Unsure Has patient had a PCN reaction occurring within the last 10 years: No If all of the above answers are "NO", then may proceed with Cephalosporin use.  . Monistat 1 [Tioconazole] Rash and Other (See Comments)    Yeast infection cream caused pain.    No medications prior to admission.    Review of Systems  Constitutional: Negative for fever.    Last menstrual period 11/05/2017. Physical Exam  Cardiovascular: Normal rate and regular rhythm.  Respiratory: Effort normal.  GI: Soft.    No results found for this or any previous visit (from the past 24 hour(s)).  No results found. \B+  Assessment/Plan: 32 yo with MAB for dilation and evacuation Will sent a portion of POC for karyotype  Heather LocusJames E Heather [name] II 01/29/2018, 4:47 PM

## 2018-01-30 ENCOUNTER — Encounter (HOSPITAL_COMMUNITY)
Admission: AD | Disposition: A | Discharge status: Home / Self Care | Payer: Self-pay | Source: Ambulatory Visit | Attending: Obstetrics and Gynecology

## 2018-01-30 ENCOUNTER — Ambulatory Visit (HOSPITAL_COMMUNITY): Payer: 59

## 2018-01-30 ENCOUNTER — Other Ambulatory Visit: Payer: Self-pay

## 2018-01-30 ENCOUNTER — Ambulatory Visit (HOSPITAL_COMMUNITY): Payer: 59 | Admitting: Anesthesiology

## 2018-01-30 ENCOUNTER — Ambulatory Visit (HOSPITAL_COMMUNITY)
Admission: AD | Admit: 2018-01-30 | Discharge: 2018-01-30 | Disposition: A | Discharge status: Home / Self Care | Payer: 59 | Source: Ambulatory Visit | Attending: Obstetrics and Gynecology | Admitting: Obstetrics and Gynecology

## 2018-01-30 ENCOUNTER — Encounter (HOSPITAL_COMMUNITY): Payer: Self-pay

## 2018-01-30 DIAGNOSIS — Z833 Family history of diabetes mellitus: Secondary | ICD-10-CM | POA: Insufficient documentation

## 2018-01-30 DIAGNOSIS — F419 Anxiety disorder, unspecified: Secondary | ICD-10-CM | POA: Diagnosis not present

## 2018-01-30 DIAGNOSIS — Z87442 Personal history of urinary calculi: Secondary | ICD-10-CM | POA: Insufficient documentation

## 2018-01-30 DIAGNOSIS — Z888 Allergy status to other drugs, medicaments and biological substances status: Secondary | ICD-10-CM | POA: Diagnosis not present

## 2018-01-30 DIAGNOSIS — Z8249 Family history of ischemic heart disease and other diseases of the circulatory system: Secondary | ICD-10-CM | POA: Diagnosis not present

## 2018-01-30 DIAGNOSIS — Z8261 Family history of arthritis: Secondary | ICD-10-CM | POA: Insufficient documentation

## 2018-01-30 DIAGNOSIS — E039 Hypothyroidism, unspecified: Secondary | ICD-10-CM | POA: Diagnosis not present

## 2018-01-30 DIAGNOSIS — G43909 Migraine, unspecified, not intractable, without status migrainosus: Secondary | ICD-10-CM | POA: Insufficient documentation

## 2018-01-30 DIAGNOSIS — E063 Autoimmune thyroiditis: Secondary | ICD-10-CM | POA: Insufficient documentation

## 2018-01-30 DIAGNOSIS — Z880 Allergy status to penicillin: Secondary | ICD-10-CM | POA: Insufficient documentation

## 2018-01-30 DIAGNOSIS — R0602 Shortness of breath: Secondary | ICD-10-CM | POA: Diagnosis not present

## 2018-01-30 DIAGNOSIS — Z809 Family history of malignant neoplasm, unspecified: Secondary | ICD-10-CM | POA: Insufficient documentation

## 2018-01-30 DIAGNOSIS — K219 Gastro-esophageal reflux disease without esophagitis: Secondary | ICD-10-CM | POA: Insufficient documentation

## 2018-01-30 DIAGNOSIS — O021 Missed abortion: Secondary | ICD-10-CM | POA: Diagnosis not present

## 2018-01-30 DIAGNOSIS — R58 Hemorrhage, not elsewhere classified: Secondary | ICD-10-CM

## 2018-01-30 DIAGNOSIS — F329 Major depressive disorder, single episode, unspecified: Secondary | ICD-10-CM | POA: Insufficient documentation

## 2018-01-30 HISTORY — DX: Nausea with vomiting, unspecified: R11.2

## 2018-01-30 HISTORY — PX: DILATION AND EVACUATION: SHX1459

## 2018-01-30 HISTORY — DX: Other specified postprocedural states: Z98.890

## 2018-01-30 LAB — CBC
HEMATOCRIT: 31.8 % — AB (ref 36.0–46.0)
Hemoglobin: 11.8 g/dL — ABNORMAL LOW (ref 12.0–15.0)
MCH: 30.3 pg (ref 26.0–34.0)
MCHC: 37.1 g/dL — AB (ref 30.0–36.0)
MCV: 81.7 fL (ref 78.0–100.0)
Platelets: 227 10*3/uL (ref 150–400)
RBC: 3.89 MIL/uL (ref 3.87–5.11)
RDW: 13.6 % (ref 11.5–15.5)
WBC: 6.6 10*3/uL (ref 4.0–10.5)

## 2018-01-30 LAB — TYPE AND SCREEN
ABO/RH(D): B POS
ANTIBODY SCREEN: NEGATIVE

## 2018-01-30 SURGERY — DILATION AND EVACUATION, UTERUS
Anesthesia: General | Site: Vagina

## 2018-01-30 MED ORDER — LIDOCAINE HCL (CARDIAC) PF 100 MG/5ML IV SOSY
PREFILLED_SYRINGE | INTRAVENOUS | Status: DC | PRN
  Administered 2018-01-30: 100 mg via INTRAVENOUS

## 2018-01-30 MED ORDER — PROPOFOL 10 MG/ML IV BOLUS
INTRAVENOUS | Status: AC
  Filled 2018-01-30: qty 20

## 2018-01-30 MED ORDER — OXYCODONE HCL 5 MG/5ML PO SOLN: 5.0000 mg | Freq: Once | ORAL | Status: DC | PRN

## 2018-01-30 MED ORDER — OXYTOCIN 10 UNIT/ML IJ SOLN
INTRAMUSCULAR | Status: AC
  Filled 2018-01-30: qty 1

## 2018-01-30 MED ORDER — MEPERIDINE HCL 25 MG/ML IJ SOLN: 6.2500 mg | INTRAMUSCULAR | Status: DC | PRN

## 2018-01-30 MED ORDER — OXYCODONE HCL 5 MG PO TABS: 5.0000 mg | ORAL_TABLET | Freq: Once | ORAL | Status: DC | PRN

## 2018-01-30 MED ORDER — FENTANYL CITRATE (PF) 100 MCG/2ML IJ SOLN
INTRAMUSCULAR | Status: AC
  Filled 2018-01-30: qty 2

## 2018-01-30 MED ORDER — SOD CITRATE-CITRIC ACID 500-334 MG/5ML PO SOLN: 30.0000 mL | ORAL | Status: DC

## 2018-01-30 MED ORDER — SUCCINYLCHOLINE CHLORIDE 200 MG/10ML IV SOSY
PREFILLED_SYRINGE | INTRAVENOUS | Status: AC
  Filled 2018-01-30: qty 10

## 2018-01-30 MED ORDER — LIDOCAINE HCL (CARDIAC) PF 100 MG/5ML IV SOSY
PREFILLED_SYRINGE | INTRAVENOUS | Status: AC
  Filled 2018-01-30: qty 5

## 2018-01-30 MED ORDER — LIDOCAINE HCL 1 % IJ SOLN
INTRAMUSCULAR | Status: AC
  Filled 2018-01-30: qty 20

## 2018-01-30 MED ORDER — SCOPOLAMINE 1 MG/3DAYS TD PT72
MEDICATED_PATCH | TRANSDERMAL | Status: AC
  Administered 2018-01-30: 1.5 mg via TRANSDERMAL
  Filled 2018-01-30: qty 1

## 2018-01-30 MED ORDER — SCOPOLAMINE 1 MG/3DAYS TD PT72
1.0000 | MEDICATED_PATCH | Freq: Once | TRANSDERMAL | Status: DC
  Administered 2018-01-30: 1.5 mg via TRANSDERMAL

## 2018-01-30 MED ORDER — SUCCINYLCHOLINE CHLORIDE 200 MG/10ML IV SOSY
PREFILLED_SYRINGE | INTRAVENOUS | Status: DC | PRN
Start: 2018-01-30 — End: 2018-01-30
  Administered 2018-01-30: 170 mg via INTRAVENOUS

## 2018-01-30 MED ORDER — LACTATED RINGERS IV SOLN
INTRAVENOUS | Status: DC
  Administered 2018-01-30: 125 mL/h via INTRAVENOUS

## 2018-01-30 MED ORDER — ONDANSETRON HCL 4 MG/2ML IJ SOLN: 4.0000 mg | Freq: Once | INTRAMUSCULAR | Status: DC | PRN

## 2018-01-30 MED ORDER — METHYLERGONOVINE MALEATE 0.2 MG/ML IJ SOLN
INTRAMUSCULAR | Status: DC | PRN
  Administered 2018-01-30: 0.2 mg via INTRAMUSCULAR

## 2018-01-30 MED ORDER — MIDAZOLAM HCL 2 MG/2ML IJ SOLN
INTRAMUSCULAR | Status: AC
  Filled 2018-01-30: qty 2

## 2018-01-30 MED ORDER — FENTANYL CITRATE (PF) 100 MCG/2ML IJ SOLN
25.0000 ug | INTRAMUSCULAR | Status: DC | PRN
  Administered 2018-01-30 (×3): 50 ug via INTRAVENOUS

## 2018-01-30 MED ORDER — FENTANYL CITRATE (PF) 100 MCG/2ML IJ SOLN
INTRAMUSCULAR | Status: DC | PRN
  Administered 2018-01-30: 100 ug via INTRAVENOUS

## 2018-01-30 MED ORDER — LIDOCAINE HCL 1 % IJ SOLN
INTRAMUSCULAR | Status: DC | PRN
  Administered 2018-01-30: 20 mL

## 2018-01-30 MED ORDER — GENTAMICIN SULFATE 40 MG/ML IJ SOLN
INTRAVENOUS | Status: AC
  Administered 2018-01-30: 350 mg via INTRAVENOUS
  Filled 2018-01-30: qty 8.75

## 2018-01-30 MED ORDER — SODIUM CHLORIDE 0.9 % IV SOLN: 8.0000 mg | Freq: Once | INTRAVENOUS | Status: DC

## 2018-01-30 MED ORDER — HEPARIN SOD (PORK) LOCK FLUSH 10 UNIT/ML IV SOLN: 10.0000 [IU] | Freq: Once | INTRAVENOUS | Status: DC

## 2018-01-30 MED ORDER — ONDANSETRON HCL 4 MG/2ML IJ SOLN
INTRAMUSCULAR | Status: AC
  Administered 2018-01-30: 8 mg
  Filled 2018-01-30: qty 4

## 2018-01-30 MED ORDER — PROPOFOL 10 MG/ML IV BOLUS
INTRAVENOUS | Status: DC | PRN
  Administered 2018-01-30: 180 mg via INTRAVENOUS
  Administered 2018-01-30: 50 mg via INTRAVENOUS

## 2018-01-30 MED ORDER — OXYTOCIN 10 UNIT/ML IJ SOLN
INTRAMUSCULAR | Status: DC | PRN
  Administered 2018-01-30 (×2): 10 [IU]

## 2018-01-30 MED ORDER — LACTATED RINGERS IV SOLN
INTRAVENOUS | Status: DC
  Administered 2018-01-30: 15:00:00 via INTRAVENOUS

## 2018-01-30 MED ORDER — ONDANSETRON HCL 4 MG/2ML IJ SOLN
INTRAMUSCULAR | Status: AC
  Filled 2018-01-30: qty 2

## 2018-01-30 MED ORDER — ACETAMINOPHEN 325 MG PO TABS: 325.0000 mg | ORAL_TABLET | ORAL | Status: DC | PRN

## 2018-01-30 MED ORDER — DEXAMETHASONE SODIUM PHOSPHATE 4 MG/ML IJ SOLN
INTRAMUSCULAR | Status: AC
  Filled 2018-01-30: qty 1

## 2018-01-30 MED ORDER — DEXAMETHASONE SODIUM PHOSPHATE 10 MG/ML IJ SOLN
INTRAMUSCULAR | Status: DC | PRN
  Administered 2018-01-30: 10 mg via INTRAVENOUS

## 2018-01-30 MED ORDER — FENTANYL CITRATE (PF) 250 MCG/5ML IJ SOLN
INTRAMUSCULAR | Status: AC
  Filled 2018-01-30: qty 5

## 2018-01-30 MED ORDER — ACETAMINOPHEN 160 MG/5ML PO SOLN: 325.0000 mg | ORAL | Status: DC | PRN

## 2018-01-30 MED ORDER — FENTANYL CITRATE (PF) 100 MCG/2ML IJ SOLN
INTRAMUSCULAR | Status: AC
  Administered 2018-01-30: 50 ug via INTRAVENOUS
  Filled 2018-01-30: qty 2

## 2018-01-30 MED ORDER — MIDAZOLAM HCL 2 MG/2ML IJ SOLN
INTRAMUSCULAR | Status: DC | PRN
  Administered 2018-01-30: 2 mg via INTRAVENOUS

## 2018-01-30 SURGICAL SUPPLY — 17 items
CATH ROBINSON RED A/P 16FR (CATHETERS) ×3 IMPLANT
DECANTER SPIKE VIAL GLASS SM (MISCELLANEOUS) ×3 IMPLANT
GLOVE BIO SURGEON STRL SZ7.5 (GLOVE) ×3 IMPLANT
GLOVE BIOGEL PI IND STRL 7.0 (GLOVE) ×1 IMPLANT
GLOVE BIOGEL PI INDICATOR 7.0 (GLOVE) ×2
GOWN STRL REUS W/TWL LRG LVL3 (GOWN DISPOSABLE) ×6 IMPLANT
KIT BERKELEY 1ST TRIMESTER 3/8 (MISCELLANEOUS) ×3 IMPLANT
NS IRRIG 1000ML POUR BTL (IV SOLUTION) ×3 IMPLANT
PACK VAGINAL MINOR WOMEN LF (CUSTOM PROCEDURE TRAY) ×3 IMPLANT
PAD OB MATERNITY 4.3X12.25 (PERSONAL CARE ITEMS) ×3 IMPLANT
PAD PREP 24X48 CUFFED NSTRL (MISCELLANEOUS) ×3 IMPLANT
SET BERKELEY SUCTION TUBING (SUCTIONS) ×3 IMPLANT
TOWEL OR 17X24 6PK STRL BLUE (TOWEL DISPOSABLE) ×6 IMPLANT
VACURETTE 10 RIGID CVD (CANNULA) ×3 IMPLANT
VACURETTE 7MM CVD STRL WRAP (CANNULA) IMPLANT
VACURETTE 8 RIGID CVD (CANNULA) IMPLANT
VACURETTE 9 RIGID CVD (CANNULA) IMPLANT

## 2018-01-30 NOTE — Op Note (Signed)
[name]: Heather [name], Heather [name] MEDICAL RECORD [id number] ACCOUNT [id number] DATE OF BIRTH:October 05, 1985 FACILITY: [facility] LOCATION: [facility] PHYSICIAN:Laurianne Floresca Jamal CollinE. Sharmaine Bain II, MD  OPERATIVE REPORT  DATE OF PROCEDURE:  01/30/2018  PREOPERATIVE DIAGNOSIS:  Missed abortion.  POSTOPERATIVE DIAGNOSIS:  Missed abortion.  PROCEDURE:  Dilation and evacuation under ultrasound guidance.  SURGEON:  Harold HedgeJames Rai Severns, II,  M.D.  ANESTHESIA:  General with endotracheal intubation,  E.J. O'Donough, MD  ESTIMATED BLOOD LOSS:  300 mL.  SPECIMENS:  Products of conception to pathology and a portion to lab for karyotype.  INDICATIONS:  This patient is a 32 year old patient with missed abortion.  Ultrasound in the office reveals intrauterine pregnancy with a crown rump length of 11 weeks and 6 days with no fetal heartbeat.  Options were discussed and the patient favors  dilation and evacuation.  Preoperatively, the procedure was discussed and potential risks and complications were reviewed including but not limited to infection, uterine perforation organ damage, bleeding requiring transfusion of blood products with HIV  and hepatitis acquisition and transfusion reaction, DVT, PE, pneumonia.  Laparoscopy, laparotomy intrauterine synechia and secondary infertility.  Sending a portion of the tissue for karyotype was also discussed.  The patient states she understands and  agrees and consent is signed on the chart.  DESCRIPTION OF PROCEDURE:  The patient was taken to the operating room where she was identified, placed in the dorsal supine position and general anesthesia was induced via endotracheal intubation.  She was placed in the dorsal lithotomy position.   Timeout was undertaken.  Suprapubic ultrasound at that point confirms that there was no fetal heartbeat.  She was then prepped, bladder was straight catheterized and draped in a sterile fashion.  Bivalve speculum was placed.  Anterior lip of the cervix  was then injected  with 1% lidocaine and grasped with a single tooth tenaculum.  Paracervical block was placed, 2,4, 5, 7, 8 and 10 o'clock positions with approximately 20 mL of the same solution.  The entire procedure is monitored with suprapubic  ultrasound guidance.  Cervix was then gently progressively dilated.  A #10 curved suction curette was then placed into the intrauterine cavity and suction curettage was carried out for products of conception.  Alternating sharp and suction curettage was  done until the cavity appears clean on ultrasound.  Pitocin was started in her IV after the initial pass of the suction curette.  She was also given Methergine 0.2 mg IM.  Instruments were removed.  Uterine massage was carried out.  The speculum was then  replaced and ultrasound revealed good hemostasis and that no fluid appears to be collecting in the uterine cavity and observation revealed good hemostasis vaginally as well.  Instruments are removed.  The patient was awakened and taken to recovery room  in stable condition.  Blood type is B positive.  AN/NUANCE  D:01/30/2018 T:01/30/2018 JOB:[id number]

## 2018-01-30 NOTE — Anesthesia Postprocedure Evaluation (Signed)
Anesthesia Post Note  Patient: Heather [name]  Procedure(s) Performed: DILATATION AND EVACUATION WITH GENETICS, ULTRASOUND GUIDANCE (N/A Vagina )     Patient location during evaluation: PACU Anesthesia Type: General Level of consciousness: awake and alert Pain management: pain level controlled Vital Signs Assessment: post-procedure vital signs reviewed and stable Respiratory status: spontaneous breathing, nonlabored ventilation, respiratory function stable and patient connected to nasal cannula oxygen Cardiovascular status: blood pressure returned to baseline and stable Postop Assessment: no apparent nausea or vomiting Anesthetic complications: no    Last Vitals:  Vitals:   01/30/18 1715 01/30/18 1730  BP: 103/74 103/77  Pulse: 97 100  Resp: 13 14  Temp:    SpO2: 100% 100%    Last Pain:  Vitals:   01/30/18 1730  TempSrc:   PainSc: 2                  Gerry Heaphy

## 2018-01-30 NOTE — Anesthesia Preprocedure Evaluation (Addendum)
Anesthesia Evaluation  Patient identified by MRN, date of birth, ID band Patient awake    Reviewed: Allergy & Precautions, NPO status , Patient's Chart, lab work & pertinent test results  History of Anesthesia Complications (+) PONV and history of anesthetic complications  Airway Mallampati: III  TM Distance: >3 FB Neck ROM: Full    Dental no notable dental hx. (+) Teeth Intact   Pulmonary shortness of breath,    Pulmonary exam normal breath sounds clear to auscultation       Cardiovascular + angina Normal cardiovascular exam Rhythm:Regular Rate:Normal     Neuro/Psych  Headaches, PSYCHIATRIC DISORDERS Anxiety Depression    GI/Hepatic Neg liver ROS, GERD  Medicated and Controlled,Ptyalism   Endo/Other  Hypothyroidism   Renal/GU   negative genitourinary   Musculoskeletal negative musculoskeletal ROS (+)   Abdominal   Peds  Hematology negative hematology ROS (+)   Anesthesia Other Findings   Reproductive/Obstetrics (+) Pregnancy 11 Weeks Missed Ab                             Anesthesia Physical  Anesthesia Plan  ASA: III  Anesthesia Plan: General   Post-op Pain Management:    Induction: Intravenous  PONV Risk Score and Plan: 3 and Ondansetron, Dexamethasone, Treatment may vary due to age or medical condition, TIVA, Propofol infusion and Scopolamine patch - Pre-op  Airway Management Planned: LMA  Additional Equipment:   Intra-op Plan:   Post-operative Plan:   Informed Consent: I have reviewed the patients History and Physical, chart, labs and discussed the procedure including the risks, benefits and alternatives for the proposed anesthesia with the patient or authorized representative who has indicated his/her understanding and acceptance.     Plan Discussed with: Anesthesiologist, CRNA and Surgeon  Anesthesia Plan Comments:        Anesthesia Quick Evaluation

## 2018-01-30 NOTE — Brief Op Note (Signed)
01/30/2018  3:30 PM  PATIENT:  Heather JerseySamantha Leinbach  32 y.o. female  PRE-OPERATIVE DIAGNOSIS:  EMBRYONIC DEMISE 5612 WEEKS  POST-OPERATIVE DIAGNOSIS:  EMBRYONIC DEMISE 7412 WEEKS  PROCEDURE:  Procedure(s) with comments: DILATATION AND EVACUATION WITH GENETICS, ULTRASOUND GUIDANCE (N/A) - GENETICS, ULTRASOUND GUIDANCE, 12 WEEK DEMISE  SURGEON:  Surgeon(s) and Role:    * Harold Hedgeomblin, [place] Perra, MD - Primary  PHYSICIAN ASSISTANT:   ASSISTANTS: none   ANESTHESIA:   general  EBL:  300 mL   BLOOD ADMINISTERED:none  DRAINS: none   LOCAL MEDICATIONS USED:  LIDOCAINE  and Amount: 20 ml  SPECIMEN:  Source of Specimen:  products of conception to pathology and a portion for karyotype  DISPOSITION OF SPECIMEN:  PATHOLOGY  COUNTS:  YES  TOURNIQUET:  * No tourniquets in log *  DICTATION: .Other Dictation: Dictation Number [id number]  PLAN OF CARE: Discharge to home after PACU  PATIENT DISPOSITION:  PACU - hemodynamically stable.   Delay start of Pharmacological VTE agent (>24hrs) due to surgical blood loss or risk of bleeding: not applicable

## 2018-01-30 NOTE — Anesthesia Procedure Notes (Signed)
Procedure Name: Intubation Date/Time: 01/30/2018 2:58 PM Performed by: Asher Muir, CRNA Pre-anesthesia Checklist: Patient identified, Emergency Drugs available, Suction available and Patient being monitored Patient Re-evaluated:Patient Re-evaluated prior to induction Oxygen Delivery Method: Circle system utilized Preoxygenation: Pre-oxygenation with 100% oxygen Induction Type: IV induction Ventilation: Mask ventilation without difficulty Laryngoscope Size: Mac and 3 Grade View: Grade II Tube size: 7.0 mm Number of attempts: 1 Placement Confirmation: ETT inserted through vocal cords under direct vision,  positive ETCO2 and breath sounds checked- equal and bilateral Secured at: 20 cm Tube secured with: Tape Dental Injury: Teeth and Oropharynx as per pre-operative assessment

## 2018-01-30 NOTE — Transfer of Care (Signed)
Immediate Anesthesia Transfer of Care Note  Patient: Heather [name]  Procedure(s) Performed: DILATATION AND EVACUATION WITH GENETICS, ULTRASOUND GUIDANCE (N/A Vagina )  Patient Location: PACU  Anesthesia Type:General  Level of Consciousness: sedated  Airway & Oxygen Therapy: Patient Spontanous Breathing and Patient connected to nasal cannula oxygen  Post-op Assessment: Report given to RN and Post -op Vital signs reviewed and stable  Post vital signs: Reviewed and stable  Last Vitals:  Vitals Value Taken Time  BP    Temp    Pulse    Resp    SpO2      Last Pain:  Vitals:   01/30/18 1352  TempSrc: Oral  PainSc: 3       Patients Stated Pain Goal: 3 (01/30/18 1352)  Complications: No apparent anesthesia complications

## 2018-01-30 NOTE — Progress Notes (Signed)
No changes to H&P per patient history Reviewed with patient procedure-D&E with karyotype D/W risks including infection, uterine perforation, organ damage, bleeding/transfusion-HIV/Hep/transfusion reaction, DVT/PE, pneumonia, intra uterine synechiae and secondary infertility, laparoscopy, laparotomy. Allergies-PCN, triamcinalone Patient states she understands and agrees

## 2018-01-30 NOTE — Discharge Instructions (Signed)

## 2018-01-31 ENCOUNTER — Encounter (HOSPITAL_COMMUNITY): Payer: Self-pay | Admitting: Obstetrics and Gynecology

## 2018-02-27 LAB — CHROMOSOME STD, POC(TISSUE)-NCBH

## 2018-08-14 MED FILL — ULTICARE SYR 1 ML 30GX5/16": 30G X 5/16" | 60 days supply | Qty: 60 | Fill #0 | Status: TO

## 2018-08-14 MED FILL — SM ALCOHOL 70% PREP PADS: 70 | 30 days supply | Qty: 100 | Fill #0

## 2018-08-14 MED FILL — SHARPS COLLECTOR 1.4QT: 30 days supply | Qty: 1 | Fill #0

## 2018-08-14 MED FILL — HEPARIN SODIUM (PORCINE) 50: 5000 | 30 days supply | Qty: 60 | Fill #0 | Status: TO

## 2018-08-14 MED FILL — ULTICARE SYR 1 ML 30GX5/16: 30G X 5/16" | 60 days supply | Qty: 60 | Fill #0 | Status: TO

## 2018-09-04 ENCOUNTER — Encounter (HOSPITAL_COMMUNITY): Payer: Self-pay | Admitting: *Deleted

## 2018-09-04 ENCOUNTER — Other Ambulatory Visit: Payer: Self-pay

## 2018-09-04 ENCOUNTER — Inpatient Hospital Stay (HOSPITAL_COMMUNITY)
Admission: AD | Admit: 2018-09-04 | Discharge: 2018-09-04 | Disposition: A | Discharge status: Home / Self Care | Payer: 59 | Attending: Obstetrics and Gynecology | Admitting: Obstetrics and Gynecology

## 2018-09-04 DIAGNOSIS — Z3A01 Less than 8 weeks gestation of pregnancy: Secondary | ICD-10-CM | POA: Diagnosis not present

## 2018-09-04 DIAGNOSIS — O99611 Diseases of the digestive system complicating pregnancy, first trimester: Secondary | ICD-10-CM | POA: Diagnosis not present

## 2018-09-04 DIAGNOSIS — K219 Gastro-esophageal reflux disease without esophagitis: Secondary | ICD-10-CM | POA: Insufficient documentation

## 2018-09-04 DIAGNOSIS — O210 Mild hyperemesis gravidarum: Secondary | ICD-10-CM

## 2018-09-04 DIAGNOSIS — F329 Major depressive disorder, single episode, unspecified: Secondary | ICD-10-CM | POA: Diagnosis not present

## 2018-09-04 DIAGNOSIS — O99281 Endocrine, nutritional and metabolic diseases complicating pregnancy, first trimester: Secondary | ICD-10-CM | POA: Insufficient documentation

## 2018-09-04 DIAGNOSIS — F419 Anxiety disorder, unspecified: Secondary | ICD-10-CM | POA: Insufficient documentation

## 2018-09-04 DIAGNOSIS — Z87442 Personal history of urinary calculi: Secondary | ICD-10-CM | POA: Diagnosis not present

## 2018-09-04 DIAGNOSIS — O26831 Pregnancy related renal disease, first trimester: Secondary | ICD-10-CM | POA: Insufficient documentation

## 2018-09-04 DIAGNOSIS — E063 Autoimmune thyroiditis: Secondary | ICD-10-CM | POA: Insufficient documentation

## 2018-09-04 DIAGNOSIS — Z79890 Hormone replacement therapy (postmenopausal): Secondary | ICD-10-CM | POA: Diagnosis not present

## 2018-09-04 DIAGNOSIS — O26891 Other specified pregnancy related conditions, first trimester: Secondary | ICD-10-CM | POA: Insufficient documentation

## 2018-09-04 DIAGNOSIS — K117 Disturbances of salivary secretion: Secondary | ICD-10-CM | POA: Insufficient documentation

## 2018-09-04 DIAGNOSIS — Z79899 Other long term (current) drug therapy: Secondary | ICD-10-CM | POA: Diagnosis not present

## 2018-09-04 DIAGNOSIS — Z880 Allergy status to penicillin: Secondary | ICD-10-CM | POA: Insufficient documentation

## 2018-09-04 DIAGNOSIS — N189 Chronic kidney disease, unspecified: Secondary | ICD-10-CM | POA: Insufficient documentation

## 2018-09-04 LAB — URINALYSIS, ROUTINE W REFLEX MICROSCOPIC
Bilirubin Urine: NEGATIVE
Glucose, UA: NEGATIVE mg/dL
Hgb urine dipstick: NEGATIVE
Ketones, ur: 5 mg/dL — AB
Nitrite: NEGATIVE
Protein, ur: 30 mg/dL — AB
Specific Gravity, Urine: 1.033 — ABNORMAL HIGH (ref 1.005–1.030)
pH: 5 (ref 5.0–8.0)

## 2018-09-04 LAB — POCT PREGNANCY, URINE: Preg Test, Ur: POSITIVE — AB

## 2018-09-04 MED ORDER — LACTATED RINGERS IV BOLUS
1000.0000 mL | Freq: Once | INTRAVENOUS | Status: AC
  Administered 2018-09-04: 1000 mL via INTRAVENOUS

## 2018-09-04 MED ORDER — GLYCOPYRROLATE 0.2 MG/ML IJ SOLN
0.2000 mg | Freq: Once | INTRAMUSCULAR | Status: AC
  Administered 2018-09-04: 0.2 mg via INTRAVENOUS
  Filled 2018-09-04: qty 1

## 2018-09-04 MED ORDER — M.V.I. ADULT IV INJ
Freq: Once | INTRAVENOUS | Status: AC
  Administered 2018-09-04: 11:00:00 via INTRAVENOUS
  Filled 2018-09-04: qty 10

## 2018-09-04 NOTE — MAU Provider Note (Signed)
History     CSN: [id number]  Arrival date and time: 09/04/18 4098   First Provider Initiated Contact with Patient 09/04/18 [phone]     Chief Complaint  Patient presents with  . Emesis   Heather [name] is a 33 y.o. G5P1 at [redacted]w[redacted]d who presents to MAU with complaints of N/V and Ptyalism. She reports having a hx of hyperemesis and ptyalism during her last pregnancy. Reports vomiting 3 times since waking up this morning around 0700. Currently on Bonjesta and Phenergan for N/V which she last took this morning around 0730 with little relief. Reports able to keep little liquids down and food. Since 0900 has started having HA and "feels like I am getting dehydrated". Currently not on any medication for Ptyalism, was on Robinul with last pregnancy and it did not help "might have made things worse". Has never tried IV Robinul and is willing to try today. Plans to switch to Zofran around 10 weeks of pregnancy. Denies vaginal bleeding or abdominal pain.   OB History    Gravida  5   Para  1   Term  1   Preterm      AB  2   Living  1     SAB  2   TAB      Ectopic      Multiple  0   Live Births  1           Past Medical History:  Diagnosis Date  . Anginal pain (HCC)    cardiac workup normal  . Anxiety   . Chlamydia    in college  . Chronic kidney disease    hx kidney stone  . Depression   . GERD (gastroesophageal reflux disease)    with pregnancy  . Hashimoto's disease   . Headache    migraines  . History of ovarian cyst    ruptured  . Hx of varicella   . Hypothyroidism    borderline  . PONV (postoperative nausea and vomiting)     Past Surgical History:  Procedure Laterality Date  . DILATION AND EVACUATION N/A 08/27/2014   Procedure: DILATATION AND EVACUATION WITH CHROMOSOMES STUDES, ULTRASOUND;  Surgeon: Harold Hedge, MD;  Location: [facility];  Service: Gynecology;  Laterality: N/A;  . DILATION AND EVACUATION N/A 01/30/2018   Procedure: DILATATION AND EVACUATION WITH  GENETICS, ULTRASOUND GUIDANCE;  Surgeon: Harold Hedge, MD;  Location: [facility];  Service: Gynecology;  Laterality: N/A;  GENETICS, ULTRASOUND GUIDANCE, 12 WEEK DEMISE  . LAPAROSCOPIC APPENDECTOMY N/A 01/13/2014   Procedure: APPENDECTOMY LAPAROSCOPIC;  Surgeon: Cherylynn Ridges, MD;  Location: [facility] OR;  Service: General;  Laterality: N/A;  . TOOTH EXTRACTION      Family History  Problem Relation Age of Onset  . Hypertension Mother   . Diabetes Mother   . Cancer Maternal Grandmother   . Cancer Maternal Grandfather   . Rheum arthritis Maternal Grandfather     Social History   Tobacco Use  . Smoking status: Never Smoker  . Smokeless tobacco: Never Used  Substance Use Topics  . Alcohol use: Not Currently  . Drug use: No    Allergies:  Allergies  Allergen Reactions  . Penicillins Other (See Comments)    Reaction:  Unknown  Has patient had a PCN reaction causing immediate rash, facial/tongue/throat swelling, SOB or lightheadedness with hypotension: Unsure Has patient had a PCN reaction causing severe rash involving mucus membranes or skin necrosis: Unsure Has patient had a PCN reaction that required  hospitalization Unsure Has patient had a PCN reaction occurring within the last 10 years: No If all of the above answers are "NO", then may proceed with Cephalosporin use.  . Monistat 1 [Tioconazole] Rash and Other (See Comments)    Yeast infection cream caused pain.    Medications Prior to Admission  Medication Sig Dispense Refill Last Dose  . ALPRAZolam (XANAX) 0.25 MG tablet Take 0.25 mg by mouth 2 (two) times daily as needed for anxiety.   01/29/2018 at Unknown time  . HEPARIN LOCK FLUSH IV Inject 1 each into the vein daily.   01/29/2018 at Unknown time  . levothyroxine (SYNTHROID, LEVOTHROID) 25 MCG tablet Take 12.5 mcg by mouth daily before breakfast. 162.5 mcg daily   01/30/2018 at 0800  . ondansetron (ZOFRAN-ODT) 4 MG disintegrating tablet Take 4 mg by mouth every 6 (six) hours as  needed for nausea or vomiting.   0 01/29/2018 at Unknown time  . pantoprazole (PROTONIX) 40 MG tablet Take 1 tablet by mouth daily.  12 01/29/2018 at Unknown time  . SYNTHROID 150 MCG tablet Take 150 mcg by mouth daily before breakfast. 162.5 mcg daily  11 01/30/2018 at 0800    Review of Systems  Constitutional: Negative.   Respiratory: Negative.   Cardiovascular: Negative.   Gastrointestinal: Positive for nausea and vomiting. Negative for abdominal pain, constipation and diarrhea.  Genitourinary: Negative.   Musculoskeletal: Negative.   Neurological: Positive for headaches. Negative for dizziness, syncope and light-headedness.   Physical Exam   Blood pressure 122/81, pulse (!) 101, temperature 98.8 F (37.1 C), temperature source Oral, resp. rate 18, weight 71.9 kg, last menstrual period 11/05/2017, SpO2 100 %, unknown if currently breastfeeding.  Physical Exam  Nursing note and vitals reviewed. Constitutional: She is oriented to person, place, and time. She appears well-developed and well-nourished. No distress.  Cardiovascular: Normal rate, regular rhythm and normal heart sounds.  No murmur heard. Respiratory: Effort normal and breath sounds normal. No respiratory distress. She has no wheezes. She has no rales.  GI: Soft. She exhibits no distension. There is no abdominal tenderness. There is no rebound and no guarding.  Musculoskeletal: Normal range of motion.        General: No edema.  Neurological: She is alert and oriented to person, place, and time.  Skin: Skin is warm and dry. There is pallor.    MAU Course  Procedures  MDM Orders Placed This Encounter  Procedures  . Urinalysis, Routine w reflex microscopic  . Insert peripheral IV   Labs reviewed:  Results for orders placed or performed during the hospital encounter of 09/04/18 (from the past 24 hour(s))  Urinalysis, Routine w reflex microscopic     Status: Abnormal   Collection Time: 09/04/18  9:59 AM  Result Value  Ref Range   Color, Urine AMBER (A) YELLOW   APPearance CLOUDY (A) CLEAR   Specific Gravity, Urine 1.033 (H) 1.005 - 1.030   pH 5.0 5.0 - 8.0   Glucose, UA NEGATIVE NEGATIVE mg/dL   Hgb urine dipstick NEGATIVE NEGATIVE   Bilirubin Urine NEGATIVE NEGATIVE   Ketones, ur 5 (A) NEGATIVE mg/dL   Protein, ur 30 (A) NEGATIVE mg/dL   Nitrite NEGATIVE NEGATIVE   Leukocytes,Ua SMALL (A) NEGATIVE   RBC / HPF 0-5 0 - 5 RBC/hpf   WBC, UA 11-20 0 - 5 WBC/hpf   Bacteria, UA MANY (A) NONE SEEN   Squamous Epithelial / LPF 21-50 0 - 5   Mucus PRESENT   Pregnancy,  urine POC     Status: Abnormal   Collection Time: 09/04/18 10:01 AM  Result Value Ref Range   Preg Test, Ur POSITIVE (A) NEGATIVE   Meds ordered this encounter  Medications  . lactated ringers bolus 1,000 mL  . multivitamins adult (INFUVITE ADULT) 10 mL in dextrose 5% lactated ringers 1,000 mL infusion  . glycopyrrolate (ROBINUL) injection 0.2 mg   Treatments in MAU included Hydration with LR bolus and Banana bag included IV Robinul 0.2mg . Patient able to tolerate PO gingerale and crackers prior to discharge home.   [facility] on phone with patient prior to discharge home to set up home health nurse. Home health nurse to be set up for 3x week IV fluid in addition to medication. Plans to start in 1-2 days. Patient is a difficult stick for IV assess, saline lock placed today for fluids sent home intact with patient for home health nurse to assess prior to changing to PICC line. Discussed for some reason if home health nurse does not arrive by Friday afternoon, needs to have IV removed due to it being 72 hours. Patient verbalizes understanding.   Continue medication at home as scheduled. Follow up with home health nurse and return to MAU as needed. Pt stable at time of discharge.   Urine culture pending.   Assessment and Plan   1. Hyperemesis arising during pregnancy   2. Ptyalism   3. [redacted] weeks gestation of pregnancy     Discharge home Urine culture pending  Follow up with [facility] for prenatal care  Follow up with home health nurse for IV fluids  Return to MAU as needed  Continue Phenergan and Bonjesta that is prescribed   Follow-up Information    [place] Follow up.   Why:  Follow up as scheduled for prenatal appointments  Contact information: [address] [place] [id number] [phone]          Sharyon CableVeronica C Govani Radloff CNM 09/04/2018, 12:28 PM

## 2018-09-04 NOTE — MAU Note (Signed)
Is 7 wks preg.  Has hyperemesis and ptyalism, had that with other preg. 5#l oss this week, now has a HAHas been seen in office.  Hx of mult losses.

## 2018-09-04 NOTE — MAU Note (Signed)
States she is on heparin 2x day 5000 units. Had an u/s 1 week ago and they saw 2 sacs, 1 was empty and they saw cardiac activity in the other but were not able to get a measurement.

## 2018-09-04 NOTE — Discharge Instructions (Signed)
Hyperemesis Gravidarum  Hyperemesis gravidarum is a severe form of nausea and vomiting that happens during pregnancy. Hyperemesis is worse than morning sickness. It may cause you to have nausea or vomiting all day for many days. It may keep you from eating and drinking enough food and liquids, which can lead to dehydration, malnutrition, and weight loss. Hyperemesis usually occurs during the first half (the first 20 weeks) of pregnancy. It often goes away once a woman is in her second half of pregnancy. However, sometimes hyperemesis continues through an entire pregnancy.  What are the causes?  The cause of this condition is not known. It may be related to changes in chemicals (hormones) in the body during pregnancy, such as the high level of pregnancy hormone (human chorionic gonadotropin) or the increase in the female sex hormone (estrogen).  What are the signs or symptoms?  Symptoms of this condition include:  Nausea that does not go away.  Vomiting that does not allow you to keep any food down.  Weight loss.  Body fluid loss (dehydration).  Having no desire to eat, or not liking food that you have previously enjoyed.  How is this diagnosed?  This condition may be diagnosed based on:  A physical exam.  Your medical history.  Your symptoms.  Blood tests.  Urine tests.  How is this treated?  This condition is managed by controlling symptoms. This may include:  Following an eating plan. This can help lessen nausea and vomiting.  Taking prescription medicines.  An eating plan and medicines are often used together to help control symptoms. If medicines do not help relieve nausea and vomiting, you may need to receive fluids through an IV at the hospital.  Follow these instructions at home:  Eating and drinking    Avoid the following:  Drinking fluids with meals. Try not to drink anything during the 30 minutes before and after your meals.  Drinking more than 1 cup of fluid at a time.  Eating foods that trigger your  symptoms. These may include spicy foods, coffee, high-fat foods, very sweet foods, and acidic foods.  Skipping meals. Nausea can be more intense on an empty stomach. If you cannot tolerate food, do not force it. Try sucking on ice chips or other frozen items and make up for missed calories later.  Lying down within 2 hours after eating.  Being exposed to environmental triggers. These may include food smells, smoky rooms, closed spaces, rooms with strong smells, warm or humid places, overly loud and noisy rooms, and rooms with motion or flickering lights. Try eating meals in a well-ventilated area that is free of strong smells.  Quick and sudden changes in your movement.  Taking iron pills and multivitamins that contain iron. If you take prescription iron pills, do not stop taking them unless your health care provider approves.  Preparing food. The smell of food can spoil your appetite or trigger nausea.  To help relieve your symptoms:  Listen to your body. Everyone is different and has different preferences. Find what works best for you.  Eat and drink slowly.  Eat 5-6 small meals daily instead of 3 large meals. Eating small meals and snacks can help you avoid an empty stomach.  In the morning, before getting out of bed, eat a couple of crackers to avoid moving around on an empty stomach.  Try eating starchy foods as these are usually tolerated well. Examples include cereal, toast, bread, potatoes, pasta, rice, and pretzels.  Include at   least 1 serving of protein with your meals and snacks. Protein options include lean meats, poultry, seafood, beans, nuts, nut butters, eggs, cheese, and yogurt.  Try eating a protein-rich snack before bed. Examples of a protein-rick snack include cheese and crackers or a peanut butter sandwich made with 1 slice of whole-wheat bread and 1 tsp (5 g) of peanut butter.  Eat or suck on things that have ginger in them. It may help relieve nausea. Add  tsp ground ginger to hot tea or  choose ginger tea.  Try drinking 100% fruit juice or an electrolyte drink. An electrolyte drink contains sodium, potassium, and chloride.  Drink fluids that are cold, clear, and carbonated or sour. Examples include lemonade, ginger ale, lemon-lime soda, ice water, and sparkling water.  Brush your teeth or use a mouth rinse after meals.  Talk with your health care provider about starting a supplement of vitamin B6.  General instructions  Take over-the-counter and prescription medicines only as told by your health care provider.  Follow instructions from your health care provider about eating or drinking restrictions.  Continue to take your prenatal vitamins as told by your health care provider. If you are having trouble taking your prenatal vitamins, talk with your health care provider about different options.  Keep all follow-up and pre-birth (prenatal) visits as told by your health care provider. This is important.  Contact a health care provider if:  You have pain in your abdomen.  You have a severe headache.  You have vision problems.  You are losing weight.  You feel weak or dizzy.  Get help right away if:  You cannot drink fluids without vomiting.  You vomit blood.  You have constant nausea and vomiting.  You are very weak.  You faint.  You have a fever and your symptoms suddenly get worse.  Summary  Hyperemesis gravidarum is a severe form of nausea and vomiting that happens during pregnancy.  Making some changes to your eating habits may help relieve nausea and vomiting.  This condition may be managed with medicine.  If medicines do not help relieve nausea and vomiting, you may need to receive fluids through an IV at the hospital.  This information is not intended to replace advice given to you by your health care provider. Make sure you discuss any questions you have with your health care provider.  Document Released: 06/05/2005 Document Revised: 06/25/2017 Document Reviewed: 02/02/2016  Elsevier Interactive  Patient Education  2019 [employer]

## 2018-09-04 NOTE — MAU Note (Signed)
Per Aundria Rud CNM, discharge patient home with 20G in L AC. Pt's IV access is saline locked. Home health is expected to see patient within the next few days. Pt under strict instruction to return to the office on Friday to have IV removed if home health is unable to see her. Pt understands.

## 2018-09-05 ENCOUNTER — Other Ambulatory Visit (HOSPITAL_COMMUNITY): Payer: Self-pay | Admitting: Obstetrics and Gynecology

## 2018-09-05 DIAGNOSIS — O210 Mild hyperemesis gravidarum: Secondary | ICD-10-CM

## 2018-09-05 LAB — CULTURE, OB URINE: Culture: <10000 — AB

## 2018-09-06 ENCOUNTER — Ambulatory Visit (HOSPITAL_COMMUNITY)
Admission: RE | Admit: 2018-09-06 | Discharge: 2018-09-06 | Disposition: A | Discharge status: Home / Self Care | Payer: 59 | Source: Ambulatory Visit | Attending: Obstetrics and Gynecology | Admitting: Obstetrics and Gynecology

## 2018-09-06 ENCOUNTER — Other Ambulatory Visit: Payer: Self-pay

## 2018-09-06 ENCOUNTER — Other Ambulatory Visit (HOSPITAL_COMMUNITY): Payer: Self-pay | Admitting: Student

## 2018-09-06 DIAGNOSIS — O210 Mild hyperemesis gravidarum: Secondary | ICD-10-CM

## 2018-09-06 MED ORDER — HEPARIN SOD (PORK) LOCK FLUSH 100 UNIT/ML IV SOLN
INTRAVENOUS | Status: AC
  Administered 2018-09-06: 500 [IU]
  Filled 2018-09-06: qty 5

## 2018-09-06 MED ORDER — LIDOCAINE HCL 1 % IJ SOLN
INTRAMUSCULAR | Status: AC | PRN
  Administered 2018-09-06: 10 mL

## 2018-09-06 MED ORDER — LIDOCAINE HCL 1 % IJ SOLN
INTRAMUSCULAR | Status: AC
  Filled 2018-09-06: qty 20

## 2018-09-06 NOTE — Procedures (Signed)
Successful placement of double lumen PICC line to left basilic vein. Length 37cm Tip at lower SVC/RA PICC capped No complications Ready for use.  EBL < 5 mL   Hoyt Koch PA-C 09/06/2018 11:40 AM

## 2018-09-10 MED FILL — TRUEPLUS SYR 1ML 30GX5/16": 30G X 5/16" | 25 days supply | Qty: 50 | Fill #0

## 2018-09-10 MED FILL — HEPARIN SODIUM (PORCINE) 50: 5000 | 25 days supply | Qty: 50 | Fill #0

## 2018-09-10 MED FILL — TRUEPLUS SYR 1ML 30GX5/16: 30G X 5/16" | 25 days supply | Qty: 50 | Fill #0

## 2018-10-03 MED FILL — HEPARIN SOD 5,000 UNIT/ML V: 5000 | 25 days supply | Qty: 50 | Fill #1

## 2018-10-04 MED FILL — TRUEPLUS SYR 1ML 30GX5/16: 30G X 5/16" | 25 days supply | Qty: 50 | Fill #1

## 2018-10-04 MED FILL — TRUEPLUS SYR 1ML 30GX5/16": 30G X 5/16" | 25 days supply | Qty: 50 | Fill #1

## 2018-10-21 MED FILL — HEPARIN SOD 5,000 UNIT/ 0.5: 5000 | 87 days supply | Qty: 88 | Fill #0

## 2018-10-21 MED FILL — HEPARIN SOD 5,000 UNIT/ 0.5: 5000 | 90 days supply | Qty: 90 | Fill #0

## 2018-10-23 MED FILL — TRUEPLUS SYR 1ML 30GX5/16": 30G X 5/16" | 90 days supply | Qty: 180 | Fill #0

## 2018-10-23 MED FILL — TRUEPLUS SYR 1ML 30GX5/16: 30G X 5/16" | 90 days supply | Qty: 180 | Fill #0

## 2018-11-20 ENCOUNTER — Ambulatory Visit (HOSPITAL_COMMUNITY): Admit: 2018-11-20 | Payer: 59 | Source: Ambulatory Visit | Admitting: Obstetrics and Gynecology

## 2018-11-20 ENCOUNTER — Ambulatory Visit (HOSPITAL_COMMUNITY)
Admission: RE | Admit: 2018-11-20 | Discharge: 2018-11-20 | Disposition: A | Discharge status: Home / Self Care | Payer: 59 | Source: Ambulatory Visit | Attending: Obstetrics and Gynecology | Admitting: Obstetrics and Gynecology

## 2018-11-20 ENCOUNTER — Other Ambulatory Visit (HOSPITAL_COMMUNITY): Payer: Self-pay | Admitting: Obstetrics and Gynecology

## 2018-11-20 DIAGNOSIS — Z95828 Presence of other vascular implants and grafts: Secondary | ICD-10-CM | POA: Insufficient documentation

## 2018-11-21 MED FILL — ONDANSETRON ODT 8 MG TABLET: 8 | 20 days supply | Qty: 60 | Fill #0

## 2018-11-21 MED FILL — PROMETHAZINE 25 MG TABLET: 25 | 15 days supply | Qty: 60 | Fill #0

## 2018-11-21 MED FILL — BONJESTA 20-20 MG TAB: 20-20 | 30 days supply | Qty: 60 | Fill #0

## 2018-12-30 ENCOUNTER — Other Ambulatory Visit: Payer: Self-pay

## 2018-12-30 ENCOUNTER — Emergency Department (HOSPITAL_COMMUNITY): Payer: 59

## 2018-12-30 ENCOUNTER — Emergency Department (HOSPITAL_BASED_OUTPATIENT_CLINIC_OR_DEPARTMENT_OTHER): Payer: 59

## 2018-12-30 ENCOUNTER — Emergency Department (HOSPITAL_COMMUNITY)
Admission: AD | Admit: 2018-12-30 | Discharge: 2018-12-30 | Disposition: A | Discharge status: Home / Self Care | Payer: 59 | Attending: Emergency Medicine | Admitting: Emergency Medicine

## 2018-12-30 DIAGNOSIS — O99412 Diseases of the circulatory system complicating pregnancy, second trimester: Secondary | ICD-10-CM | POA: Insufficient documentation

## 2018-12-30 DIAGNOSIS — Z03818 Encounter for observation for suspected exposure to other biological agents ruled out: Secondary | ICD-10-CM | POA: Diagnosis not present

## 2018-12-30 DIAGNOSIS — M542 Cervicalgia: Secondary | ICD-10-CM | POA: Diagnosis not present

## 2018-12-30 DIAGNOSIS — O210 Mild hyperemesis gravidarum: Secondary | ICD-10-CM

## 2018-12-30 DIAGNOSIS — R52 Pain, unspecified: Secondary | ICD-10-CM

## 2018-12-30 DIAGNOSIS — Z79899 Other long term (current) drug therapy: Secondary | ICD-10-CM | POA: Insufficient documentation

## 2018-12-30 DIAGNOSIS — E039 Hypothyroidism, unspecified: Secondary | ICD-10-CM | POA: Insufficient documentation

## 2018-12-30 DIAGNOSIS — R0781 Pleurodynia: Secondary | ICD-10-CM | POA: Insufficient documentation

## 2018-12-30 DIAGNOSIS — R079 Chest pain, unspecified: Secondary | ICD-10-CM

## 2018-12-30 DIAGNOSIS — R0789 Other chest pain: Secondary | ICD-10-CM | POA: Insufficient documentation

## 2018-12-30 DIAGNOSIS — O99282 Endocrine, nutritional and metabolic diseases complicating pregnancy, second trimester: Secondary | ICD-10-CM | POA: Diagnosis not present

## 2018-12-30 DIAGNOSIS — O9989 Other specified diseases and conditions complicating pregnancy, childbirth and the puerperium: Secondary | ICD-10-CM | POA: Diagnosis not present

## 2018-12-30 DIAGNOSIS — R000 Tachycardia, unspecified: Secondary | ICD-10-CM | POA: Diagnosis not present

## 2018-12-30 DIAGNOSIS — Z3A23 23 weeks gestation of pregnancy: Secondary | ICD-10-CM | POA: Diagnosis not present

## 2018-12-30 LAB — BASIC METABOLIC PANEL
Anion gap: 10 (ref 5–15)
BUN: 7 mg/dL (ref 6–20)
CO2: 21 mmol/L — ABNORMAL LOW (ref 22–32)
Calcium: 8.9 mg/dL (ref 8.9–10.3)
Chloride: 105 mmol/L (ref 98–111)
Creatinine, Ser: 0.61 mg/dL (ref 0.44–1.00)
GFR calc Af Amer: >60 mL/min (ref >60)
GFR calc non Af Amer: >60 mL/min (ref >60)
Glucose, Bld: 102 mg/dL — ABNORMAL HIGH (ref 70–99)
Potassium: 3.7 mmol/L (ref 3.5–5.1)
Sodium: 136 mmol/L (ref 135–145)

## 2018-12-30 LAB — I-STAT BETA HCG BLOOD, ED (MC, WL, AP ONLY): I-stat hCG, quantitative: >2000 m[IU]/mL — ABNORMAL HIGH (ref <5)

## 2018-12-30 LAB — CBC
HCT: 33.8 % — ABNORMAL LOW (ref 36.0–46.0)
Hemoglobin: 11.8 g/dL — ABNORMAL LOW (ref 12.0–15.0)
MCH: 31.8 pg (ref 26.0–34.0)
MCHC: 34.9 g/dL (ref 30.0–36.0)
MCV: 91.1 fL (ref 80.0–100.0)
Platelets: 186 10*3/uL (ref 150–400)
RBC: 3.71 MIL/uL — ABNORMAL LOW (ref 3.87–5.11)
RDW: 13.7 % (ref 11.5–15.5)
WBC: 12.1 10*3/uL — ABNORMAL HIGH (ref 4.0–10.5)
nRBC: 0 % (ref 0.0–0.2)

## 2018-12-30 LAB — SARS CORONAVIRUS 2 BY RT PCR (HOSPITAL ORDER, PERFORMED IN ~~LOC~~ HOSPITAL LAB): SARS Coronavirus 2: NEGATIVE

## 2018-12-30 LAB — TROPONIN I (HIGH SENSITIVITY)
Troponin I (High Sensitivity): <2 ng/L (ref <18)
Troponin I (High Sensitivity): <2 ng/L (ref <18)

## 2018-12-30 MED ORDER — HEPARIN SOD (PORK) LOCK FLUSH 10 UNIT/ML IV SOLN: 5000.0000 [IU] | Freq: Two times a day (BID) | INTRAVENOUS | Status: DC

## 2018-12-30 MED ORDER — SODIUM CHLORIDE 0.9 % IV BOLUS: 1000.0000 mL | Freq: Once | INTRAVENOUS | Status: DC

## 2018-12-30 MED ORDER — HEPARIN SODIUM (PORCINE) 5000 UNIT/ML IJ SOLN
5000.0000 [IU] | Freq: Once | INTRAMUSCULAR | Status: AC
  Administered 2018-12-30: 13:00:00 5000 [IU] via SUBCUTANEOUS
  Filled 2018-12-30: qty 1

## 2018-12-30 MED ORDER — LEVOTHYROXINE SODIUM 75 MCG PO TABS
175.0000 ug | ORAL_TABLET | Freq: Every day | ORAL | Status: DC
  Administered 2018-12-30: 175 ug via ORAL
  Filled 2018-12-30: qty 1

## 2018-12-30 MED ORDER — DOCUSATE SODIUM 100 MG PO CAPS
100.0000 mg | ORAL_CAPSULE | Freq: Two times a day (BID) | ORAL | Status: DC
  Administered 2018-12-30: 08:00:00 100 mg via ORAL
  Filled 2018-12-30: qty 1

## 2018-12-30 MED ORDER — PROMETHAZINE HCL 25 MG PO TABS
25.0000 mg | ORAL_TABLET | Freq: Every day | ORAL | Status: DC
  Administered 2018-12-30 (×3): 25 mg via ORAL
  Filled 2018-12-30 (×3): qty 1

## 2018-12-30 MED ORDER — DOXYLAMINE-PYRIDOXINE ER 20-20 MG PO TBCR: 1.0000 | EXTENDED_RELEASE_TABLET | Freq: Two times a day (BID) | ORAL | Status: DC

## 2018-12-30 MED ORDER — IOHEXOL 350 MG/ML SOLN
100.0000 mL | Freq: Once | INTRAVENOUS | Status: AC | PRN
  Administered 2018-12-30: 18:00:00 100 mL via INTRAVENOUS

## 2018-12-30 MED ORDER — LEVOTHYROXINE SODIUM 25 MCG PO TABS
12.5000 ug | ORAL_TABLET | Freq: Every day | ORAL | Status: DC
  Administered 2018-12-30: 08:00:00 12.5 ug via ORAL
  Filled 2018-12-30: qty 1

## 2018-12-30 MED ORDER — LIDOCAINE VISCOUS HCL 2 % MT SOLN
15.0000 mL | Freq: Once | OROMUCOSAL | Status: AC
  Administered 2018-12-30: 15 mL via ORAL
  Filled 2018-12-30: qty 15

## 2018-12-30 MED ORDER — ACETAMINOPHEN 500 MG PO TABS: 1000.0000 mg | ORAL_TABLET | Freq: Once | ORAL | Status: DC

## 2018-12-30 MED ORDER — SODIUM CHLORIDE 0.9% FLUSH: 3.0000 mL | Freq: Once | INTRAVENOUS | Status: DC

## 2018-12-30 MED ORDER — ONDANSETRON HCL 4 MG/2ML IJ SOLN
4.0000 mg | Freq: Once | INTRAMUSCULAR | Status: AC
  Administered 2018-12-30: 4 mg via INTRAVENOUS
  Filled 2018-12-30: qty 2

## 2018-12-30 MED ORDER — ACETAMINOPHEN 500 MG PO TABS
1000.0000 mg | ORAL_TABLET | Freq: Once | ORAL | Status: AC
  Administered 2018-12-30: 08:00:00 1000 mg via ORAL
  Filled 2018-12-30: qty 2

## 2018-12-30 MED ORDER — FAMOTIDINE 20 MG PO TABS
10.0000 mg | ORAL_TABLET | Freq: Every day | ORAL | Status: DC
  Administered 2018-12-30: 08:00:00 10 mg via ORAL
  Filled 2018-12-30: qty 1

## 2018-12-30 MED ORDER — ACETAMINOPHEN 325 MG PO TABS
650.0000 mg | ORAL_TABLET | Freq: Once | ORAL | Status: AC
  Administered 2018-12-30: 650 mg via ORAL
  Filled 2018-12-30: qty 2

## 2018-12-30 MED ORDER — ALUM & MAG HYDROXIDE-SIMETH 200-200-20 MG/5ML PO SUSP
30.0000 mL | Freq: Once | ORAL | Status: AC
  Administered 2018-12-30: 30 mL via ORAL
  Filled 2018-12-30: qty 30

## 2018-12-30 MED ORDER — PANTOPRAZOLE SODIUM 40 MG PO TBEC
40.0000 mg | DELAYED_RELEASE_TABLET | Freq: Every day | ORAL | Status: DC
  Administered 2018-12-30: 40 mg via ORAL
  Filled 2018-12-30: qty 1

## 2018-12-30 MED ORDER — ONDANSETRON 4 MG PO TBDP
8.0000 mg | ORAL_TABLET | Freq: Three times a day (TID) | ORAL | Status: DC | PRN
  Administered 2018-12-30: 8 mg via ORAL
  Filled 2018-12-30: qty 2

## 2018-12-30 NOTE — Discharge Instructions (Signed)
Continue your IVF and nausea medicines as prescribed.   You can take tylenol as needed for fever. Your COVID is negative and you have no pneumonia currently. You likely have a viral illness   See your OB doctor in 2-3 days   Return to ER if you have worse neck pain, neck stiffness, headaches, vomiting, dehydration, abdominal pain, urinary symptoms

## 2018-12-30 NOTE — MAU Note (Signed)
Pt transported via wheelchair to ED by RN. Report given to ED charge RN, Santiago Glad

## 2018-12-30 NOTE — ED Notes (Signed)
All appropriate discharge materials reviewed at length with patient. Time for questions provided. Pt has no other questions at this time and verbalizes understanding of all provided materials.  

## 2018-12-30 NOTE — MAU Note (Addendum)
Called patient to triage and pt cluching chest. Pt reports chest pain x3 hours. Pt states that it hurts when she breathes. Denies SOB. 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 No history of cardiac issues, PE. Pt denies LOF, vaginal bleeding or contractions. Reports good fetal movement.

## 2018-12-30 NOTE — ED Notes (Signed)
Patient transported to CT 

## 2018-12-30 NOTE — MAU Provider Note (Signed)
None     S [name] is a 33 y.o. G43P1021 non-pregnant female who presents to MAU today with complaint of Chest and Neck Pain. She states she has been experiencing this pain for the past 3 hours.  Patient states it started in her neck and has radiated down to her chest.  She denies vaginal concerns including bleeding, leaking, and discharge.  She also endorses fetal movement.    O BP 102/67 (BP Location: Right Arm)   Pulse (!) 106   Resp 18   Ht 5\' 9"  (1.753 m)   Wt 75.4 kg   SpO2 100%   BMI 24.56 kg/m  Physical Exam  Constitutional: She is oriented to person, place, and time. She appears well-developed and well-nourished. She appears distressed.  HENT:  Head: Normocephalic and atraumatic.  Eyes: Conjunctivae are normal.  Cardiovascular: Normal rate.  Respiratory: Effort normal.  Musculoskeletal: Normal range of motion.  Neurological: She is alert and oriented to person, place, and time.  Skin: There is pallor.  Psychiatric: She has a normal mood and affect. Her behavior is normal.    A 33 year old G57P1021  Medical screening exam complete Chest Pain   P -Patient informed that she will be transferred to main ER for assessment. -Patient questions if Dr. Lynnette Caffey will be notified and informed she would if necessary.  -ED provider/MD informed of patient status and need for transfer. -Patient may return to MAU as needed for pregnancy related complaints.  Heather [name], CNM 12/30/2018 2:48 AM

## 2018-12-30 NOTE — ED Notes (Signed)
Reject x1,  I will have another phleb collect labs

## 2018-12-30 NOTE — ED Triage Notes (Signed)
Pt reports pain started in right side of next 4 hours ago and radiates down into chest. Denies fever or cough. Pain increases with movement, breathing, talking. Pt is approx [redacted] weeks pregnant.

## 2018-12-30 NOTE — ED Provider Notes (Addendum)
Physical Exam  BP 95/83 (BP Location: Right Arm)   Pulse (!) 113   Temp (!) 100.8 F (38.2 C) (Oral)   Resp 16   Ht 5\' 9"  (1.753 m)   Wt 75.4 kg   SpO2 100%   BMI 24.56 kg/m   Physical Exam  ED Course/Procedures     Procedures  Results for orders placed or performed during the hospital encounter of 12/30/18  SARS Coronavirus 2 (CEPHEID - Performed in [facility]), [facility]   Specimen: Nasopharyngeal Swab  Result Value Ref Range   SARS Coronavirus 2 NEGATIVE NEGATIVE  Basic metabolic panel  Result Value Ref Range   Sodium 136 135 - 145 mmol/L   Potassium 3.7 3.5 - 5.1 mmol/L   Chloride 105 98 - 111 mmol/L   CO2 21 (L) 22 - 32 mmol/L   Glucose, Bld 102 (H) 70 - 99 mg/dL   BUN 7 6 - 20 mg/dL   Creatinine, Ser 6.570.61 0.44 - 1.00 mg/dL   Calcium 8.9 8.9 - 84.610.3 mg/dL   GFR calc non Af Amer >60 >60 mL/min   GFR calc Af Amer >60 >60 mL/min   Anion gap 10 5 - 15  CBC  Result Value Ref Range   WBC 12.1 (H) 4.0 - 10.5 K/uL   RBC 3.71 (L) 3.87 - 5.11 MIL/uL   Hemoglobin 11.8 (L) 12.0 - 15.0 g/dL   HCT 96.233.8 (L) 95.236.0 - 84.146.0 %   MCV 91.1 80.0 - 100.0 fL   MCH 31.8 26.0 - 34.0 pg   MCHC 34.9 30.0 - 36.0 g/dL   RDW 32.413.7 40.111.5 - 02.715.5 %   Platelets 186 150 - 400 K/uL   nRBC 0.0 0.0 - 0.2 %  I-Stat beta hCG blood, ED  Result Value Ref Range   I-stat hCG, quantitative >2,000.0 (H) <5 mIU/mL   Comment 3          Troponin I (High Sensitivity)  Result Value Ref Range   Troponin I (High Sensitivity) <2 <18 ng/L  Troponin I (High Sensitivity)  Result Value Ref Range   Troponin I (High Sensitivity) <2 <18 ng/L   Dg Chest 2 View  Result Date: 12/30/2018 CLINICAL DATA:  [age] y/o F; pregnant patient with pain on the right-sided face radiating to the chest. EXAM: CHEST - 2 VIEW COMPARISON:  11/20/2018 chest radiograph FINDINGS: The heart size and mediastinal contours are within normal limits. Stable position of left PICC line with tip projecting over the SVC/brachiocephalic  junction. Both lungs are clear. The visualized skeletal structures are unremarkable. IMPRESSION: No acute pulmonary process identified. Electronically Signed   By: Mitzi HansenLance  Furusawa-Stratton M.D.   On: 12/30/2018 03:47   Ct Angio Neck W And/or Wo Contrast  Result Date: 12/30/2018 CLINICAL DATA:  Thrombus left internal jugular vein noted on bedside ultrasound. Pregnant and hypercoagulable condition. On heparin. EXAM: CT ANGIOGRAPHY NECK TECHNIQUE: Multidetector CT imaging of the neck was performed using the standard protocol during bolus administration of intravenous contrast. Multiplanar CT image reconstructions and MIPs were obtained to evaluate the vascular anatomy. Carotid stenosis measurements (when applicable) are obtained utilizing NASCET criteria, using the distal internal carotid diameter as the denominator. CONTRAST:  100mL OMNIPAQUE IOHEXOL 350 MG/ML SOLN COMPARISON:  None. FINDINGS: Aortic arch: Standard branching. Imaged portion shows no evidence of aneurysm or dissection. No significant stenosis of the major arch vessel origins. Left arm PICC tip enters the left innominate vein. Right carotid system: Right carotid system widely patent without  stenosis or irregularity Left carotid system: Left carotid system widely patent without stenosis or irregularity Vertebral arteries: Both vertebral arteries patent to the basilar without stenosis. Skeleton: 2 negative Other neck: Limited venous evaluation. Arterial phase scanning. Small amount of contrast is seen in the proximal jugular veins which are patent bilaterally. There is minimal contrast in the mid and distal jugular vein bilaterally with limited evaluation. I cannot exclude jugular vein thrombosis. Upper chest: Lung apices clear bilaterally. IMPRESSION: Negative CT angiography of the neck. Carotid and vertebral arteries are normal Limited venous evaluation due to arterial phase scanning. Unfortunately, delayed venous phase imaging was not obtained in  this study and are not able to evaluate for jugular venous thrombosis. Electronically Signed   By: Franchot Gallo M.D.   On: 12/30/2018 18:00   Ct Angio Chest Pe W And/or Wo Contrast  Result Date: 12/30/2018 CLINICAL DATA:  4 hours of right-sided chest pain, no fever or cough. High probability of ACS/PE/AS EXAM: CT ANGIOGRAPHY CHEST WITH CONTRAST TECHNIQUE: Multidetector CT imaging of the chest was performed using the standard protocol during bolus administration of intravenous contrast. Multiplanar CT image reconstructions and MIPs were obtained to evaluate the vascular anatomy. CONTRAST:  149mL OMNIPAQUE IOHEXOL 350 MG/ML SOLN COMPARISON:  CTA chest 01/10/2018 FINDINGS: Cardiovascular: Satisfactory opacification of the pulmonary arteries to the segmental level. No evidence of pulmonary embolism. Normal heart size. No pericardial effusion. Normal 3 vessel aortic arch. Cardiac pulsation artifact limits evaluation of the aortic root. No definite acute aortic abnormality. No periaortic inflammation. Mediastinum/Nodes: No enlarged mediastinal, hilar, or axillary lymph nodes. Thyroid gland, trachea, and esophagus demonstrate no significant findings. Lungs/Pleura: Patchy mosaic attenuation likely the result of imaging during exhalation with bowing of the posterior trachea. No consolidative process. No pneumothorax or effusion. No suspicious nodules or masses. Dependent atelectasis posteriorly. Upper Abdomen: No acute abnormality. Excretion of contrast medium within both kidneys likely related to separate bolus from CT angiography of the neck. Musculoskeletal: No chest wall abnormality. No acute or significant osseous findings. Review of the MIP images confirms the above findings. IMPRESSION: Normal CT angiography of the chest. No evidence of pulmonary embolism. Electronically Signed   By: Heather [name] Lovena Le   On: 12/30/2018 17:57   Ue Venous Duplex ([facility] And [facility] Only)  Result Date: 12/30/2018 UPPER VENOUS STUDY   Indications: Pain, and neck pain radiating to center chest Other Indications: [redacted] weeks pregnant. Comparison Study: No prior study on file for comparison Performing Technologist: Sharion Dove RVS  Examination Guidelines: A complete evaluation includes B-mode imaging, spectral Doppler, color Doppler, and power Doppler as needed of all accessible portions of each vessel. Bilateral testing is considered an integral part of a complete examination. Limited examinations for reoccurring indications may be performed as noted.  Right Findings: +----------+------------+---------+-----------+----------+--------------------+ RIGHT     CompressiblePhasicitySpontaneousProperties      Summary        +----------+------------+---------+-----------+----------+--------------------+ IJV           Full       Yes       Yes                                   +----------+------------+---------+-----------+----------+--------------------+ Subclavian               Yes       Yes                                   +----------+------------+---------+-----------+----------+--------------------+  Axillary                 Yes       Yes                                   +----------+------------+---------+-----------+----------+--------------------+ Brachial      Full                                                       +----------+------------+---------+-----------+----------+--------------------+ Radial                                                patent by color                                                              Doppler        +----------+------------+---------+-----------+----------+--------------------+ Ulnar                                                 patent by color                                                              Doppler        +----------+------------+---------+-----------+----------+--------------------+ Cephalic      Full                                                        +----------+------------+---------+-----------+----------+--------------------+ Basilic       Full                                                       +----------+------------+---------+-----------+----------+--------------------+  Left Findings: +----------+------------+---------+-----------+----------+-------+ LEFT      CompressiblePhasicitySpontaneousPropertiesSummary +----------+------------+---------+-----------+----------+-------+ Subclavian               Yes       Yes                      +----------+------------+---------+-----------+----------+-------+  Summary:  Right: No evidence of deep vein thrombosis in the upper extremity. No evidence of superficial vein thrombosis in the upper extremity.  Left: No evidence of thrombosis in the subclavian.  *See table(s) above for measurements and observations.  Diagnosing physician: Sherald Hesshristopher Clark Heather [name] Electronically signed by Sherald Hesshristopher Clark  Heather [name] on 12/30/2018 at 5:13:21 PM.    Final     MDM  Care assumed at 4 PM from Dr. Juleen ChinaKohut.  Patient actually came in last night for neck pain and shortness of breath and was noted to be tachycardic.  On the bedside ultrasound per Dr. Harland Dingwallardama's note, there may be a possible IJ clot.  However patient is already anticoagulated with heparin.  Vascular ultrasound was ordered and vascular tech was able to get the upper extremity ultrasound but not a carotid duplex.  Consequently, CTA of the neck as well as CT angios chest was ordered and results were pending at that time.  Also around the time of signout, patient developed a fever of 100.8 that was not present on initial evaluation.  Patient denies any abdominal pain or urinary symptoms.  Patient states that she has a PICC line that is not red or infected and does get daily IV fluids through that.  7:18 PM Patient's HR down to 113, which is similar to previous. No vomiting while in the ED. CTA chest unremarkable. CTA neck  unremarkable.  However since it is an arterial study unable to evaluate for IJ clot.  However my suspicion is very low since she is on anticoagulation already.  I discussed case with Dr. Rana SnareLowe, who knows her. He states that as long as she is clinically well and doesn't appear septic and has no obvious PICC line infection, she can follow-up outpatient.  Of note, patient does not have any meningeal signs on my exam and lungs appear clear and her abdomen is soft and nontender. I reviewed her results with her and felt that she is stable for discharge.  Patient already has a PICC line and gets daily IV fluids.  I told her to follow-up with her OB doctor in 2 to 3 days.       Heather [name], Heather Murcia Hsienta, Heather [name] 12/30/18 Jerene Bears1920    Heather [name], Heather Brill Hsienta, Heather [name] 12/30/18 [phone]

## 2018-12-30 NOTE — Progress Notes (Signed)
VASCULAR LAB PRELIMINARY  PRELIMINARY  PRELIMINARY  PRELIMINARY  Right upper extremity venous duplexcompleted.    Preliminary report:  See CV proc for preliminary results.   Gave report to Dr. Wilson Singer.  Talayla Doyel, RVT 12/30/2018, 9:54 AM

## 2018-12-30 NOTE — ED Notes (Signed)
Ultrasound at bedside

## 2018-12-30 NOTE — ED Provider Notes (Addendum)
[facility] EMERGENCY DEPARTMENT Provider Note  CSN: [id number] Arrival date & time: 12/30/18 0230  Chief Complaint(s) Chest Pain and Neck Pain  HPI Heather [name] is a 33 y.o. 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  Chest Pain Pain location:  R chest Pain quality: sharp and stabbing   Radiates to: radiates from neck down to chest. 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 Anginal pain ([place])    cardiac workup normal  . Anxiety   . Chlamydia    in college  . Chronic kidney disease    hx kidney stone  . Depression   . GERD (gastroesophageal reflux disease)    with pregnancy  . Hashimoto's disease   . Headache    migraines  . History of ovarian cyst    ruptured  . Hx of varicella   . Hypothyroidism    borderline  . PONV (postoperative nausea and vomiting)    Patient Active Problem List   Diagnosis Date Noted  . Pelvic pain 04/09/2017  . Candida vaginitis 04/09/2017  . Pregnancy 12/28/2015  . Hydronephrosis determined by ultrasound 07/06/2015  . Hyperemesis arising during pregnancy 08/19/2014  . Hyperemesis affecting pregnancy, antepartum 08/12/2014  . Appendicitis, acute 01/12/2014   Home Medication(s) Prior to Admission medications   Medication Sig Start Date  End Date Taking? Authorizing Provider  HEPARIN LOCK FLUSH IV Inject 1 each into the vein daily.    [provider]  levothyroxine (SYNTHROID, LEVOTHROID) 25 MCG tablet Take 12.5 mcg by mouth daily before breakfast. 162.5 mcg daily    [provider]  pantoprazole (PROTONIX) 40 MG tablet Take 1 tablet by mouth daily. 01/15/18   [provider]  SYNTHROID 150 MCG tablet Take 150 mcg by mouth daily before breakfast. 162.5 mcg daily 12/31/17   [provider]                                                                                                                                    Past Surgical History Past Surgical History:  Procedure Laterality Date  . DILATION AND EVACUATION N/A 08/27/2014   Procedure: DILATATION AND EVACUATION WITH CHROMOSOMES STUDES, ULTRASOUND;  Surgeon: Everlene Farrier, MD;  Location: [facility];  Service: Gynecology;  Laterality: N/A;  . DILATION AND EVACUATION N/A 01/30/2018   Procedure: DILATATION AND EVACUATION WITH GENETICS, ULTRASOUND GUIDANCE;  Surgeon: Harold Hedgeomblin, James, MD;  Location: [facility];  Service: Gynecology;  Laterality: N/A;  GENETICS, ULTRASOUND GUIDANCE, 12 WEEK DEMISE  . LAPAROSCOPIC APPENDECTOMY N/A 01/13/2014   Procedure: APPENDECTOMY LAPAROSCOPIC;  Surgeon: Cherylynn RidgesJames O Wyatt, MD;  [facility];  Service: General;  Laterality: N/A;  . TOOTH EXTRACTION     Family History Family History  Problem Relation Age of Onset  . Hypertension Mother   . Diabetes Mother   . Cancer Maternal Grandmother   . Cancer Maternal Grandfather   . Rheum arthritis Maternal Grandfather     Social History Social History   Tobacco Use  . Smoking status: Never Smoker  . Smokeless tobacco: Never Used  Substance Use Topics  . Alcohol use: Not Currently  . Drug use: No   Allergies Penicillins and Monistat 1 [tioconazole]  Review of Systems Review of Systems  Constitutional: Negative for fever.  Respiratory: Negative for cough and  shortness of breath.   Cardiovascular: Positive for chest pain.  Gastrointestinal: Positive for nausea and vomiting (2/2 hyperemesis gravidum).  Musculoskeletal: Positive for neck pain.   All other systems are reviewed and are negative for acute change except as noted in the HPI  Physical Exam Vital Signs  I have reviewed the triage vital signs BP 104/78 (BP Location: Right Arm)   Pulse (!) 101   Temp 98.9 F (37.2 C) (Oral)   Resp 18   Ht 5\' 9"  (1.753 m)   Wt 75.4 kg   SpO2 100%   BMI 24.56 kg/m   Physical Exam Vitals signs reviewed.  Constitutional:      General: She is not in acute distress.    Appearance: She is well-developed. She is not diaphoretic.  HENT:     Head: Normocephalic and atraumatic.     Nose: Nose normal.  Eyes:     General: No scleral icterus.       Right eye: No discharge.        Left eye: No discharge.     Conjunctiva/sclera: Conjunctivae normal.     Pupils: Pupils are equal, round, and reactive to light.  Neck:     Musculoskeletal: Normal range of motion and neck supple. Muscular tenderness present.   Cardiovascular:     Rate and Rhythm: Regular rhythm. Tachycardia present.     Heart sounds: No murmur. No friction rub. No gallop.   Pulmonary:     Effort: Pulmonary effort is normal. No respiratory distress.     Breath sounds: Normal breath sounds. No stridor. No rales.  Chest:     Chest wall: No tenderness.  Abdominal:     General: There is no distension.     Palpations: Abdomen is soft.     Tenderness: There is no abdominal tenderness.  Musculoskeletal:        General: No tenderness.  Skin:    General: Skin is warm and dry.     Findings: No erythema or rash.  Neurological:     Mental Status: She is alert and oriented to person, place, and time.     ED Results and Treatments Labs (all labs ordered are listed, but only abnormal results are displayed) Labs Reviewed  BASIC METABOLIC PANEL - Abnormal; Notable for the following  components:      Result Value   CO2 21 (*)    Glucose, Bld 102 (*)  All other components within normal limits  CBC - Abnormal; Notable for the following components:   WBC 12.1 (*)    RBC 3.71 (*)    Hemoglobin 11.8 (*)    HCT 33.8 (*)    All other components within normal limits  I-STAT BETA HCG BLOOD, ED ([facility], [facility], [facility] ONLY) - Abnormal; Notable for the following components:   I-stat hCG, quantitative >2,000.0 (*)    All other components within normal limits  TROPONIN I (HIGH SENSITIVITY)  TROPONIN I (HIGH SENSITIVITY)                                                                                                                         EKG did not cross from MUSE     Radiology Dg Chest 2 View  Result Date: 12/30/2018 CLINICAL DATA:  33 y/o F; pregnant patient with pain on the right-sided face radiating to the chest. EXAM: CHEST - 2 VIEW COMPARISON:  11/20/2018 chest radiograph FINDINGS: The heart size and mediastinal contours are within normal limits. Stable position of left PICC line with tip projecting over the SVC/brachiocephalic junction. Both lungs are clear. The visualized skeletal structures are unremarkable. IMPRESSION: No acute pulmonary process identified. Electronically Signed   By: Mitzi HansenLance  Furusawa-Stratton M.D.   On: 12/30/2018 03:47    Pertinent labs & imaging results that were available during my care of the patient were reviewed by me and considered in my medical decision making (see chart for details).  Medications Ordered in ED Medications  sodium chloride flush (NS) 0.9 % injection 3 mL (has no administration in time range)  ondansetron (ZOFRAN) injection 4 mg (has no administration in time range)  alum & mag hydroxide-simeth (MAALOX/MYLANTA) 200-200-20 MG/5ML suspension 30 mL (has no administration in time range)    And  lidocaine (XYLOCAINE) 2 % viscous mouth solution 15 mL (has no administration in time range)                                                                                                                                     Procedures Ultrasound ED Thoracic  Date/Time: 12/30/2018 6:41 AM Performed by: Nira Connardama, Nickisha Hum Eduardo, MD Authorized by: Nira Connardama, Keelin Sheridan Eduardo, MD   Procedure details:    Indications: chest pain     Scope: IJ thrombus.   Images: archived   Impression:    Impression comment:  Hyperechoic structure w/in  IJ    (including critical care time)  Medical Decision Making / ED Course I have reviewed the nursing notes for this encounter and the patient's prior records (if available in EHR or on provided paperwork).   Heather [name] was evaluated in Emergency Department on 12/30/2018 for the symptoms described in the history of present illness. She was evaluated in the context of the global COVID-19 pandemic, which necessitated consideration that the patient might be at risk for infection with the SARS-CoV-2 virus that causes COVID-19. Institutional protocols and algorithms that pertain to the evaluation of patients at risk for COVID-19 are in a state of rapid change based on information released by regulatory bodies including the CDC and federal and state organizations. These policies and algorithms were followed during the patient's care in the ED.  Patient presents with right-sided neck and chest pain that is pleuritic.EKG without acute ischemic changes or evidence of pericarditis..  Presentation is not suspicious for ACS.  Chest x-ray without evidence suggestive of pneumonia, pneumothorax, pneumomediastinum.  No abnormal contour of the mediastinum to suggest dissection.   Patient has no tenderness to palpation of the chest.  She is got mild tenderness to the right neck consistent with muscular pain however I have low suspicion that this is causing the patient's pleuritic chest pain.  Also considered esophagitis given her hyperemesis gravidarum.  She was given GI cocktail which provided no relief.  Given patient's  hypercoagulable state, I have a suspicion for thrombus.  Bedside ultrasound of the IJ was suspicious for thrombus.  Will obtain a formal vascular study to confirm.  I consulted Dr. Langston MaskerMorris [place] [facility] [place] (who is Dr. Renaldo FiddlerAdkins' partner), who agreed with the vascular study.  She requested that we consult Dr. Renaldo FiddlerAdkins again in the morning once the study is complete.  She believes that regardless the patient will be admitted for further management or work-up.  Patient care turned over to Dr Juleen ChinaKohut. Patient case and results discussed in detail; please see their note for further ED managment.        Final Clinical Impression(s) / ED Diagnoses Final diagnoses:  Pleuritic chest pain      This chart was dictated using voice recognition software.  Despite best efforts to proofread,  errors can occur which can change the documentation meaning.     Nira Connardama, Gabryel Files Eduardo, MD 12/30/18 [phone]

## 2019-01-03 ENCOUNTER — Emergency Department (HOSPITAL_COMMUNITY)
Admission: EM | Admit: 2019-01-03 | Discharge: 2019-01-03 | Disposition: A | Discharge status: Home / Self Care | Payer: 59 | Attending: Emergency Medicine | Admitting: Emergency Medicine

## 2019-01-03 ENCOUNTER — Other Ambulatory Visit: Payer: Self-pay

## 2019-01-03 ENCOUNTER — Encounter (HOSPITAL_COMMUNITY): Payer: Self-pay | Admitting: Emergency Medicine

## 2019-01-03 DIAGNOSIS — Z3A24 24 weeks gestation of pregnancy: Secondary | ICD-10-CM | POA: Diagnosis not present

## 2019-01-03 DIAGNOSIS — O9989 Other specified diseases and conditions complicating pregnancy, childbirth and the puerperium: Secondary | ICD-10-CM | POA: Insufficient documentation

## 2019-01-03 DIAGNOSIS — Z79899 Other long term (current) drug therapy: Secondary | ICD-10-CM | POA: Insufficient documentation

## 2019-01-03 DIAGNOSIS — R0789 Other chest pain: Secondary | ICD-10-CM | POA: Diagnosis not present

## 2019-01-03 LAB — CBC
HCT: 28.2 % — ABNORMAL LOW (ref 36.0–46.0)
Hemoglobin: 9.8 g/dL — ABNORMAL LOW (ref 12.0–15.0)
MCH: 31.8 pg (ref 26.0–34.0)
MCHC: 34.8 g/dL (ref 30.0–36.0)
MCV: 91.6 fL (ref 80.0–100.0)
Platelets: 166 10*3/uL (ref 150–400)
RBC: 3.08 MIL/uL — ABNORMAL LOW (ref 3.87–5.11)
RDW: 13.6 % (ref 11.5–15.5)
WBC: 7.1 10*3/uL (ref 4.0–10.5)
nRBC: 0 % (ref 0.0–0.2)

## 2019-01-03 LAB — I-STAT BETA HCG BLOOD, ED (MC, WL, AP ONLY): I-stat hCG, quantitative: >2000 m[IU]/mL — ABNORMAL HIGH (ref <5)

## 2019-01-03 LAB — BASIC METABOLIC PANEL
Anion gap: 10 (ref 5–15)
BUN: 6 mg/dL (ref 6–20)
CO2: 21 mmol/L — ABNORMAL LOW (ref 22–32)
Calcium: 8.7 mg/dL — ABNORMAL LOW (ref 8.9–10.3)
Chloride: 106 mmol/L (ref 98–111)
Creatinine, Ser: 0.58 mg/dL (ref 0.44–1.00)
GFR calc Af Amer: >60 mL/min (ref >60)
GFR calc non Af Amer: >60 mL/min (ref >60)
Glucose, Bld: 148 mg/dL — ABNORMAL HIGH (ref 70–99)
Potassium: 3.3 mmol/L — ABNORMAL LOW (ref 3.5–5.1)
Sodium: 137 mmol/L (ref 135–145)

## 2019-01-03 LAB — TROPONIN I (HIGH SENSITIVITY)
Troponin I (High Sensitivity): 4 ng/L (ref <18)
Troponin I (High Sensitivity): 4 ng/L (ref <18)

## 2019-01-03 MED ORDER — MORPHINE SULFATE (PF) 4 MG/ML IV SOLN
4.0000 mg | Freq: Once | INTRAVENOUS | Status: AC
  Administered 2019-01-03: 16:00:00 4 mg via INTRAVENOUS
  Filled 2019-01-03: qty 1

## 2019-01-03 MED ORDER — SODIUM CHLORIDE 0.9% FLUSH
3.0000 mL | Freq: Once | INTRAVENOUS | Status: AC
  Administered 2019-01-03: 16:00:00 3 mL via INTRAVENOUS

## 2019-01-03 MED ORDER — HYDROCODONE-ACETAMINOPHEN 5-325 MG PO TABS: 1.0000 | ORAL_TABLET | Freq: Four times a day (QID) | ORAL | 0 refills | Status: DC | PRN

## 2019-01-03 MED ORDER — ONDANSETRON HCL 4 MG/2ML IJ SOLN
4.0000 mg | Freq: Once | INTRAMUSCULAR | Status: AC
  Administered 2019-01-03: 4 mg via INTRAVENOUS
  Filled 2019-01-03: qty 2

## 2019-01-03 NOTE — ED Triage Notes (Signed)
Pt here for eval of chest pain that started Sunday, was seen Monday for same. Pt states pain has been constant since then but got worse today. Pt is [redacted] weeks pregnant.

## 2019-01-03 NOTE — Progress Notes (Signed)
Dr Gaetano Net notified of fhr baseline at 145bpm, moderate variability, 10X10 accels and 2 quick variable decels.  MD also made aware that ED doctor plans on attempting to get pt more comfortable and then sending her home.  Dr Gaetano Net made aware of troponin results and WBC as he was concerned about PICC line being infected.  Dr Gaetano Net says pt may be cleared obstetrically at this time.

## 2019-01-03 NOTE — Progress Notes (Signed)
ED called saying pt was had reported to them with chest pain.  Pt was seen for this same symptom on 12/30/18 and had chest xrays and cts done of chest and neck.  Her Covid test was also negative.  Pt is a G5P1 at 24w 2d and has no pregnancy complaints at this time.  Consulted with Dr Gaetano Net who only wants FHT dopplered. ED notified of this.  They will doppler FHT and are instructed to call [facility] nurse back if any pregnancy issues arise.

## 2019-01-03 NOTE — Progress Notes (Signed)
[facility] received call back from Dr Gaetano Net who after reviewing pts chart further would now like an NST.  [facility] nurse to go to ED for fetal monitoring.

## 2019-01-03 NOTE — Discharge Instructions (Addendum)
Hydrocodone as prescribed as needed for pain.  Continue other medications as previously prescribed.  Follow-up with your OB/GYN, and return to the emergency department if you develop worsening pain, worsening breathing, high fevers, or other new and concerning symptoms.

## 2019-01-03 NOTE — ED Notes (Signed)
Called a MAU- will have rapid follow and after chest pain is evaluated, if needed then send to MAU

## 2019-01-03 NOTE — ED Notes (Signed)
Patient verbalizes understanding of discharge instructions. Opportunity for questioning and answers were provided. Armband removed by staff, pt discharged from ED.  

## 2019-01-03 NOTE — ED Notes (Signed)
OB rapid response called. 

## 2019-01-03 NOTE — ED Provider Notes (Signed)
[facility] EMERGENCY DEPARTMENT Provider Note   CSN: [id number] Arrival date & time: 01/03/19  1258     History   Chief Complaint Chief Complaint  Patient presents with  . Chest Pain    HPI Heather [name] is a 33 y.o. female.     Patient is a 33 year old female at [redacted] weeks gestation with past medical history of recurrent miscarriage.  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  Patient currently receiving Lovenox injections.  She presents today with complaints of chest pain.  She describes pain to her right upper chest that radiates into her neck.  This is worse with certain positions and movement.  She denies to me she is short of breath, but the pain occasionally worsens when she breathes and moves.  She denies any fevers, chills, or cough.  This began in the absence of any injury or trauma.  Patient was seen here several days ago with similar complaints.  She underwent extensive work-up including CT angiography of the neck and chest.  This showed no evidence for any acute abnormality.  There was no pulmonary embolism and no issues with the circulation in her neck.  She returns today with ongoing pain.  The history is provided by the patient.  Chest Pain Pain location:  R chest Pain quality: radiating and sharp   Pain radiates to:  Neck and R jaw Pain severity:  Moderate Duration:  5 days Timing:  Constant Progression:  Worsening Chronicity:  New Relieved by:  Rest Worsened by:  Certain positions and movement Ineffective treatments: Acetaminophen.   Past Medical History:  Diagnosis Date  . Anginal pain ([place])    cardiac workup normal  . Anxiety   . Chlamydia    in college  . Chronic kidney disease    hx kidney stone  . Depression   . GERD (gastroesophageal reflux disease)    with pregnancy  . Hashimoto's disease   . Headache    migraines  . History of ovarian cyst    ruptured  . Hx of varicella   . Hypothyroidism    borderline  . PONV (postoperative nausea and vomiting)     Patient Active Problem List   Diagnosis Date Noted  . Pelvic pain 04/09/2017  . Candida vaginitis 04/09/2017  . Pregnancy 12/28/2015  . Hydronephrosis determined by ultrasound 07/06/2015  . Hyperemesis arising during pregnancy 08/19/2014  . Hyperemesis affecting pregnancy, antepartum 08/12/2014  . Appendicitis, acute 01/12/2014    Past Surgical History:  Procedure Laterality Date  . DILATION AND EVACUATION N/A 08/27/2014   Procedure: DILATATION AND EVACUATION WITH CHROMOSOMES STUDES, ULTRASOUND;  Surgeon: Everlene Farrier, MD;  Location: [facility];  Service: Gynecology;  Laterality: N/A;  . DILATION AND EVACUATION N/A 01/30/2018   Procedure: DILATATION AND EVACUATION WITH GENETICS, ULTRASOUND GUIDANCE;  Surgeon: Everlene Farrier, MD;  Location: [facility];  Service: Gynecology;  Laterality: N/A;  GENETICS, ULTRASOUND GUIDANCE, 12 WEEK DEMISE  . LAPAROSCOPIC APPENDECTOMY N/A 01/13/2014   Procedure: APPENDECTOMY LAPAROSCOPIC;  Surgeon: Gwenyth Ober, MD;  Location: [facility];  Service: General;  Laterality: N/A;  . TOOTH EXTRACTION       OB History    Gravida  5   Para  1   Term  1   Preterm      AB  2   Living  1     SAB  2   TAB  Ectopic      Multiple  0   Live Births  1            Home Medications    Prior to Admission medications   Medication Sig Start Date End Date Taking? Authorizing Provider  BONJESTA 20-20 MG TBCR Take 1 tablet by mouth 2 (two) times a day. 11/23/18  Yes [provider]  docusate sodium (COLACE) 100 MG capsule Take 100 mg by mouth 2 (two) times a day.   Yes [provider]  famotidine (PEPCID) 10 MG tablet Take 10 mg by mouth daily.   Yes [provider]  heparin flush 10 UNIT/ML SOLN injection Inject 5,000 Units into the vein 2 (two) times a day. Uses to keep Picc Line open   Yes [provider]   levothyroxine (SYNTHROID, LEVOTHROID) 25 MCG tablet Take 12.5 mcg by mouth daily before breakfast. Take with 1 tablet of 175 mcg to equal 187.5 mcg   Yes [provider]  ondansetron (ZOFRAN-ODT) 8 MG disintegrating tablet Take 8 mg by mouth every 8 (eight) hours as needed for nausea.  12/24/18  Yes [provider]  pantoprazole (PROTONIX) 40 MG tablet Take 1 tablet by mouth daily. 01/15/18  Yes [provider]  promethazine (PHENERGAN) 25 MG tablet Take 25 mg by mouth 6 (six) times daily. 11/23/18  Yes [provider]  SYNTHROID 175 MCG tablet Take 175 mcg by mouth daily. Take with 1/2 tablet of 25mcg to equal 187.5 mcg 12/17/18  Yes [provider]    Family History Family History  Problem Relation Age of Onset  . Hypertension Mother   . Diabetes Mother   . Cancer Maternal Grandmother   . Cancer Maternal Grandfather   . Rheum arthritis Maternal Grandfather     Social History Social History   Tobacco Use  . Smoking status: Never Smoker  . Smokeless tobacco: Never Used  Substance Use Topics  . Alcohol use: Not Currently  . Drug use: No     Allergies   Penicillins and Monistat 1 [tioconazole]   Review of Systems Review of Systems  Cardiovascular: Positive for chest pain.  All other systems reviewed and are negative.    Physical Exam Updated Vital Signs BP 104/71 (BP Location: Right Arm)   Pulse (!) 108   Temp 98.1 F (36.7 C) (Oral)   Resp (!) 24   Ht 5\' 9"  (1.753 m)   Wt 74.4 kg   SpO2 99%   BMI 24.22 kg/m   Physical Exam Vitals signs and nursing note reviewed.  Constitutional:      General: She is not in acute distress.    Appearance: She is well-developed. She is not diaphoretic.  HENT:     Head: Normocephalic and atraumatic.  Neck:     Musculoskeletal: Normal range of motion and neck supple.  Cardiovascular:     Rate and Rhythm: Normal rate and regular rhythm.     Heart sounds: No murmur. No friction rub. No  gallop.   Pulmonary:     Effort: Pulmonary effort is normal. No respiratory distress.     Breath sounds: Normal breath sounds. No wheezing.  Abdominal:     General: Bowel sounds are normal. There is no distension.     Palpations: Abdomen is soft.     Tenderness: There is no abdominal tenderness.     Comments: Patient has a gravid uterus consistent with stated gestational age.  Musculoskeletal: Normal range of motion.  Skin:  General: Skin is warm and dry.  Neurological:     Mental Status: She is alert and oriented to person, place, and time.      ED Treatments / Results  Labs (all labs ordered are listed, but only abnormal results are displayed) Labs Reviewed  BASIC METABOLIC PANEL - Abnormal; Notable for the following components:      Result Value   Potassium 3.3 (*)    CO2 21 (*)    Glucose, Bld 148 (*)    Calcium 8.7 (*)    All other components within normal limits  CBC - Abnormal; Notable for the following components:   RBC 3.08 (*)    Hemoglobin 9.8 (*)    HCT 28.2 (*)    All other components within normal limits  I-STAT BETA HCG BLOOD, ED ([facility], [facility], [facility] ONLY) - Abnormal; Notable for the following components:   I-stat hCG, quantitative >2,000.0 (*)    All other components within normal limits  TROPONIN I (HIGH SENSITIVITY)  TROPONIN I (HIGH SENSITIVITY)    EKG EKG Interpretation  Date/Time:  Friday January 03 2019 13:13:34 EDT Ventricular Rate:  117 PR Interval:  128 QRS Duration: 70 QT Interval:  302 QTC Calculation: 421 R Axis:   56 Text Interpretation:  Sinus tachycardia Nonspecific T wave abnormality Abnormal ECG Confirmed by Geoffery LyonseLo, Emmitt Matthews ([id number]) on 01/03/2019 3:09:14 PM   Radiology No results found.  Procedures Procedures (including critical care time)  Medications Ordered in ED Medications  sodium chloride flush (NS) 0.9 % injection 3 mL (has no administration in time range)     Initial Impression / Assessment and Plan / ED Course  I have  reviewed the triage vital signs and the nursing notes.  Pertinent labs & imaging results that were available during my care of the patient were reviewed by me and considered in my medical decision making (see chart for details).  Patient presenting here with complaints of chest discomfort.  She is [redacted] weeks pregnant.  She was seen with similar complaints 5 days ago and underwent extensive work-up including CT angios of the chest, neck, and laboratory studies which were all unremarkable.  She returns with ongoing discomfort.  Her work-up today shows no change in her EKG and negative troponin.  Remainder of laboratory studies are unremarkable.  Patient has been seen by rapid response OB who do not feel as though there are any pregnancy related issues.  At this point, patient is feeling better after medications given in the ER.  I feel as though her work-up several days ago has excluded an emergent cause.  This is worse with movement and palpation and may well be musculoskeletal in nature.  Patient will be discharged to home with pain medication and is to follow-up as needed.  Final Clinical Impressions(s) / ED Diagnoses   Final diagnoses:  None    ED Discharge Orders    None       Geoffery Lyonselo, Skarlett Sedlacek, MD 01/03/19 1749

## 2019-01-08 MED FILL — HEPARIN SOD 5,000 UNIT/ML V: 5000 | 86 days supply | Qty: 175 | Fill #0

## 2019-01-16 MED FILL — HEPARIN SOD 5,000 UNIT/ML V: 5000 | 86 days supply | Qty: 175 | Fill #0

## 2019-01-16 MED FILL — TRUEPLUS SYR 1ML 30GX5/16": 30G X 5/16" | 10 days supply | Qty: 20 | Fill #1

## 2019-01-16 MED FILL — TRUEPLUS SYR 1ML 30GX5/16: 30G X 5/16" | 10 days supply | Qty: 20 | Fill #1

## 2019-02-01 ENCOUNTER — Other Ambulatory Visit: Payer: Self-pay

## 2019-02-01 ENCOUNTER — Encounter (HOSPITAL_COMMUNITY): Payer: Self-pay | Admitting: *Deleted

## 2019-02-01 ENCOUNTER — Inpatient Hospital Stay (EMERGENCY_DEPARTMENT_HOSPITAL)
Admission: AD | Admit: 2019-02-01 | Discharge: 2019-02-01 | Disposition: A | Discharge status: Home / Self Care | Payer: 59 | Source: Home / Self Care | Attending: Obstetrics & Gynecology | Admitting: Obstetrics & Gynecology

## 2019-02-01 DIAGNOSIS — O368330 Maternal care for abnormalities of the fetal heart rate or rhythm, third trimester, not applicable or unspecified: Secondary | ICD-10-CM | POA: Diagnosis not present

## 2019-02-01 DIAGNOSIS — Z20828 Contact with and (suspected) exposure to other viral communicable diseases: Secondary | ICD-10-CM | POA: Insufficient documentation

## 2019-02-01 DIAGNOSIS — O4703 False labor before 37 completed weeks of gestation, third trimester: Secondary | ICD-10-CM | POA: Diagnosis not present

## 2019-02-01 DIAGNOSIS — O42913 Preterm premature rupture of membranes, unspecified as to length of time between rupture and onset of labor, third trimester: Principal | ICD-10-CM

## 2019-02-01 DIAGNOSIS — Z3A28 28 weeks gestation of pregnancy: Secondary | ICD-10-CM

## 2019-02-01 DIAGNOSIS — O471 False labor at or after 37 completed weeks of gestation: Secondary | ICD-10-CM | POA: Insufficient documentation

## 2019-02-01 LAB — URINALYSIS, ROUTINE W REFLEX MICROSCOPIC
Bilirubin Urine: NEGATIVE
Glucose, UA: NEGATIVE mg/dL
Hgb urine dipstick: NEGATIVE
Ketones, ur: NEGATIVE mg/dL
Nitrite: NEGATIVE
Protein, ur: NEGATIVE mg/dL
Specific Gravity, Urine: 1.019 (ref 1.005–1.030)
pH: 8 (ref 5.0–8.0)

## 2019-02-01 LAB — FETAL FIBRONECTIN: Fetal Fibronectin: NEGATIVE

## 2019-02-01 LAB — WET PREP, GENITAL
Clue Cells Wet Prep HPF POC: NONE SEEN
Sperm: NONE SEEN
Trich, Wet Prep: NONE SEEN
WBC, Wet Prep HPF POC: NONE SEEN
Yeast Wet Prep HPF POC: NONE SEEN

## 2019-02-01 LAB — SARS CORONAVIRUS 2 BY RT PCR (HOSPITAL ORDER, PERFORMED IN ~~LOC~~ HOSPITAL LAB): SARS Coronavirus 2: NEGATIVE

## 2019-02-01 MED ORDER — HEPARIN SOD (PORK) LOCK FLUSH 10 UNIT/ML IV SOLN
10.0000 [IU] | Freq: Once | INTRAVENOUS | Status: DC
  Filled 2019-02-01 (×2): qty 1

## 2019-02-01 MED ORDER — NIFEDIPINE ER OSMOTIC RELEASE 30 MG PO TB24: 30.0000 mg | ORAL_TABLET | Freq: Every day | ORAL | 3 refills | Status: DC

## 2019-02-01 MED ORDER — NIFEDIPINE 10 MG PO CAPS
10.0000 mg | ORAL_CAPSULE | ORAL | Status: DC | PRN
  Administered 2019-02-01 (×2): 10 mg via ORAL
  Filled 2019-02-01 (×3): qty 1

## 2019-02-01 MED ORDER — LACTATED RINGERS IV BOLUS
1000.0000 mL | Freq: Once | INTRAVENOUS | Status: AC
  Administered 2019-02-01: 1000 mL via INTRAVENOUS

## 2019-02-01 MED ORDER — ONDANSETRON 4 MG PO TBDP
4.0000 mg | ORAL_TABLET | ORAL | Status: DC
  Administered 2019-02-01: 4 mg via ORAL
  Filled 2019-02-01: qty 1

## 2019-02-01 NOTE — MAU Provider Note (Addendum)
Patient Heather [name] is a 33 y.o. [id number] At [redacted]w[redacted]d here with complaints of contractions. She denies bleeding, LOF, decreased fetal movements, pain with urination, recent intercourse, NV. She has a  PICC line for her IV fluids, which she takes daily (Husband administers). She denies decreased fetal movements or other ob-gyn complaints.    History     CSN: [id number]  Arrival date and time: 02/01/19 1459   First Provider Initiated Contact with Patient 02/01/19 1600      Chief Complaint  Patient presents with  . Contractions   Abdominal Pain This is a new problem. The current episode started yesterday. The problem occurs constantly. The pain is at a severity of 6/10. The quality of the pain is cramping. Associated symptoms include vomiting and weight loss. Pertinent negatives include no diarrhea. Nothing aggravates the pain.   She states that she started feeling pain yesterday and then again today. She was unable to take a nap today; stating that they are getting stronger and closer together.   OB History    Gravida  5   Para  1   Term  1   Preterm      AB  2   Living  1     SAB  2   TAB      Ectopic      Multiple  0   Live Births  1           Past Medical History:  Diagnosis Date  . Anginal pain ([place])    cardiac workup normal  . Anxiety   . Chlamydia    in college  . Chronic kidney disease    hx kidney stone  . Depression   . GERD (gastroesophageal reflux disease)    with pregnancy  . Hashimoto's disease   . Headache    migraines  . History of ovarian cyst    ruptured  . Hx of varicella   . Hypothyroidism    borderline  . PONV (postoperative nausea and vomiting)     Past Surgical History:  Procedure Laterality Date  . DILATION AND EVACUATION N/A 08/27/2014   Procedure: DILATATION AND EVACUATION WITH CHROMOSOMES STUDES, ULTRASOUND;  Surgeon: Everlene Farrier, MD;  Location: [facility];  Service: Gynecology;  Laterality: N/A;  . DILATION AND  EVACUATION N/A 01/30/2018   Procedure: DILATATION AND EVACUATION WITH GENETICS, ULTRASOUND GUIDANCE;  Surgeon: Everlene Farrier, MD;  Location: [facility];  Service: Gynecology;  Laterality: N/A;  GENETICS, ULTRASOUND GUIDANCE, 12 WEEK DEMISE  . LAPAROSCOPIC APPENDECTOMY N/A 01/13/2014   Procedure: APPENDECTOMY LAPAROSCOPIC;  Surgeon: Gwenyth Ober, MD;  Location: [facility];  Service: General;  Laterality: N/A;  . TOOTH EXTRACTION      Family History  Problem Relation Age of Onset  . Hypertension Mother   . Diabetes Mother   . Cancer Maternal Grandmother   . Cancer Maternal Grandfather   . Rheum arthritis Maternal Grandfather     Social History   Tobacco Use  . Smoking status: Never Smoker  . Smokeless tobacco: Never Used  Substance Use Topics  . Alcohol use: Not Currently  . Drug use: No    Allergies:  Allergies  Allergen Reactions  . Penicillins Other (See Comments)    Reaction:  Unknown  Has patient had a PCN reaction causing immediate rash, facial/tongue/throat swelling, SOB or lightheadedness with hypotension: Unsure Has patient had a PCN reaction causing severe rash involving mucus membranes or skin necrosis: Unsure Has patient had a PCN reaction  that required hospitalization Unsure Has patient had a PCN reaction occurring within the last 10 years: No If all of the above answers are "NO", then may proceed with Cephalosporin use.  . Chlorhexidine Gluconate Rash  . Monistat 1 [Tioconazole] Rash and Other (See Comments)    Yeast infection cream caused pain.    Medications Prior to Admission  Medication Sig Dispense Refill Last Dose  . BONJESTA 20-20 MG TBCR Take 1 tablet by mouth 2 (two) times a day.     . docusate sodium (COLACE) 100 MG capsule Take 100 mg by mouth 2 (two) times a day.     . famotidine (PEPCID) 10 MG tablet Take 10 mg by mouth daily.     . heparin flush 10 UNIT/ML SOLN injection Inject 5,000 Units into the vein 2 (two) times a day. Uses to keep Picc Line open      . HYDROcodone-acetaminophen (NORCO) 5-325 MG tablet Take 1-2 tablets by mouth every 6 (six) hours as needed. 15 tablet 0   . levothyroxine (SYNTHROID, LEVOTHROID) 25 MCG tablet Take 12.5 mcg by mouth daily before breakfast. Take with 1 tablet of 175 mcg to equal 187.5 mcg     . ondansetron (ZOFRAN-ODT) 8 MG disintegrating tablet Take 8 mg by mouth every 8 (eight) hours as needed for nausea.      . pantoprazole (PROTONIX) 40 MG tablet Take 1 tablet by mouth daily.  12   . promethazine (PHENERGAN) 25 MG tablet Take 25 mg by mouth 6 (six) times daily.     Marland Kitchen. SYNTHROID 175 MCG tablet Take 175 mcg by mouth daily. Take with 1/2 tablet of 25mcg to equal 187.5 mcg       Review of Systems  Constitutional: Positive for weight loss.  Eyes: Negative.   Respiratory: Negative.   Gastrointestinal: Positive for abdominal pain and vomiting. Negative for diarrhea.  Genitourinary: Negative.   Neurological: Negative.   Psychiatric/Behavioral: Negative.    Physical Exam   Blood pressure 101/66, pulse (!) 117, temperature 100 F (37.8 C), temperature source Oral, resp. rate 20, SpO2 100 %, unknown if currently breastfeeding.  Physical Exam  Constitutional: She appears well-developed.  HENT:  Head: Normocephalic.  Neck: Normal range of motion.  Respiratory: Effort normal.  GI: Soft.  Genitourinary:    Vagina normal.     Genitourinary Comments: NEFG; cervix is middle position, thick, internal os is 0.5 but external os is 1 cm.    Musculoskeletal: Normal range of motion.  Neurological: She is alert.    MAU Course  Procedures  MDM -NST: 160 bpm, mod var, present acel, neg decels, contractions q1 min; patient had PO and IV 1 LR (her normal). UA showed no signs of dehyration.  -Delay in starting fluids due to need to obtain heparin flush and hang fluids through picc line -Will plan to give 4 mg ODT Zofran at 6 pm (patient's normal dose) -One dose of procardia given and patient feels contractions are  lessening, although still present.  -FFN is negative -wet prep negative -GC chlamydia pending.  -1800 Patient now reporting she feels hot; temperature is 100.0. She is tachycardic; will draw rapid Covd-19 test.   1900: fever now resolved; 98.6 temp.  Patient received two doses of procardia; then BP was too low to given 3rd dose.  Discussed patient with Dr. Jolayne Pantheronstant, who recommends discharge with 30 XL of procardia to take daily.  Assessment and Plan   1. Preterm labor without delivery    -Patient stable  for discharge with rapid Covid-19 test pending. I will call her with results later tonight.  -Discharge home with RX for XL procardia to help with preterm labor -Strict return precautions given; explained warning signs and when to return.  -Patient to keep follow up OB appt on Monday and discuss possibility of getting another bag of LR to administer at home.    Charlesetta GaribaldiKathryn Lorraine Sagan Wurzel 02/01/2019, 6:36 PM

## 2019-02-01 NOTE — MAU Note (Signed)
Feeling ctx for past few days, getting more regular today.  Now about 5-15 min apart.  Feeling stronger than previous braxton hicks contractions.  Reports good fetal movement.  Denies vaginal bleeding or discharge.

## 2019-02-01 NOTE — Progress Notes (Signed)
Pt states she is feeling flushed and would like RN to check a temp

## 2019-02-01 NOTE — Discharge Instructions (Signed)
Preterm Labor and Birth Information °Pregnancy normally lasts 39-41 weeks. Preterm labor is when labor starts early. It starts before you have been pregnant for 37 whole weeks. °What are the risk factors for preterm labor? °Preterm labor is more likely to occur in women who: °· Have an infection while pregnant. °· Have a cervix that is short. °· Have gone into preterm labor before. °· Have had surgery on their cervix. °· Are younger than age 31. °· Are older than age 35. °· Are African American. °· Are pregnant with two or more babies. °· Take street drugs while pregnant. °· Smoke while pregnant. °· Do not gain enough weight while pregnant. °· Got pregnant right after another pregnancy. °What are the symptoms of preterm labor? °Symptoms of preterm labor include: °· Cramps. The cramps may feel like the cramps some women get during their period. The cramps may happen with watery poop (diarrhea). °· Pain in the belly (abdomen). °· Pain in the lower back. °· Regular contractions or tightening. It may feel like your belly is getting tighter. °· Pressure in the lower belly that seems to get stronger. °· More fluid (discharge) leaking from the vagina. The fluid may be watery or bloody. °· Water breaking. °Why is it important to notice signs of preterm labor? °Babies who are born early may not be fully developed. They have a higher chance for: °· Long-term heart problems. °· Long-term lung problems. °· Trouble controlling body systems, like breathing. °· Bleeding in the brain. °· A condition called cerebral palsy. °· Learning difficulties. °· Death. °These risks are highest for babies who are born before 34 weeks of pregnancy. °How is preterm labor treated? °Treatment depends on: °· How long you were pregnant. °· Your condition. °· The health of your baby. °Treatment may involve: °· Having a stitch (suture) placed in your cervix. When you give birth, your cervix opens so the baby can come out. The stitch keeps the cervix  from opening too soon. °· Staying at the hospital. °· Taking or getting medicines, such as: °? Hormone medicines. °? Medicines to stop contractions. °? Medicines to help the baby’s lungs develop. °? Medicines to prevent your baby from having cerebral palsy. °What should I do if I am in preterm labor? °If you think you are going into labor too soon, call your doctor right away. °How can I prevent preterm labor? °· Do not use any tobacco products. °? Examples of these are cigarettes, chewing tobacco, and e-cigarettes. °? If you need help quitting, ask your doctor. °· Do not use street drugs. °· Do not use any medicines unless you ask your doctor if they are safe for you. °· Talk with your doctor before taking any herbal supplements. °· Make sure you gain enough weight. °· Watch for infection. If you think you might have an infection, get it checked right away. °· If you have gone into preterm labor before, tell your doctor. °This information is not intended to replace advice given to you by your health care provider. Make sure you discuss any questions you have with your health care provider. °Document Released: 09/01/2008 Document Revised: 09/27/2018 Document Reviewed: 10/27/2015 °Elsevier Patient Education © 2020 [employer] ° °

## 2019-02-02 ENCOUNTER — Encounter (HOSPITAL_COMMUNITY): Payer: Self-pay

## 2019-02-02 ENCOUNTER — Inpatient Hospital Stay (HOSPITAL_COMMUNITY)
Admission: AD | Admit: 2019-02-02 | Discharge: 2019-02-07 | DRG: 786 | Disposition: A | Discharge status: Home / Self Care | Payer: 59 | Attending: Obstetrics & Gynecology | Admitting: Obstetrics & Gynecology

## 2019-02-02 ENCOUNTER — Inpatient Hospital Stay (HOSPITAL_COMMUNITY): Payer: 59 | Admitting: Anesthesiology

## 2019-02-02 ENCOUNTER — Inpatient Hospital Stay (HOSPITAL_COMMUNITY): Payer: 59

## 2019-02-02 ENCOUNTER — Encounter (HOSPITAL_COMMUNITY)
Admission: AD | Disposition: A | Discharge status: Home / Self Care | Payer: Self-pay | Source: Ambulatory Visit | Attending: Obstetrics & Gynecology

## 2019-02-02 DIAGNOSIS — Z3A28 28 weeks gestation of pregnancy: Secondary | ICD-10-CM

## 2019-02-02 DIAGNOSIS — O321XX0 Maternal care for breech presentation, not applicable or unspecified: Secondary | ICD-10-CM | POA: Diagnosis present

## 2019-02-02 DIAGNOSIS — E039 Hypothyroidism, unspecified: Secondary | ICD-10-CM | POA: Diagnosis present

## 2019-02-02 DIAGNOSIS — O9902 Anemia complicating childbirth: Secondary | ICD-10-CM | POA: Diagnosis present

## 2019-02-02 DIAGNOSIS — Z880 Allergy status to penicillin: Secondary | ICD-10-CM | POA: Diagnosis not present

## 2019-02-02 DIAGNOSIS — R000 Tachycardia, unspecified: Secondary | ICD-10-CM

## 2019-02-02 DIAGNOSIS — Z888 Allergy status to other drugs, medicaments and biological substances status: Secondary | ICD-10-CM | POA: Diagnosis not present

## 2019-02-02 DIAGNOSIS — Z20828 Contact with and (suspected) exposure to other viral communicable diseases: Secondary | ICD-10-CM | POA: Diagnosis present

## 2019-02-02 DIAGNOSIS — R509 Fever, unspecified: Secondary | ICD-10-CM | POA: Diagnosis not present

## 2019-02-02 DIAGNOSIS — O99284 Endocrine, nutritional and metabolic diseases complicating childbirth: Secondary | ICD-10-CM | POA: Diagnosis present

## 2019-02-02 DIAGNOSIS — L258 Unspecified contact dermatitis due to other agents: Secondary | ICD-10-CM | POA: Diagnosis not present

## 2019-02-02 DIAGNOSIS — O42913 Preterm premature rupture of membranes, unspecified as to length of time between rupture and onset of labor, third trimester: Secondary | ICD-10-CM | POA: Diagnosis present

## 2019-02-02 DIAGNOSIS — O368390 Maternal care for abnormalities of the fetal heart rate or rhythm, unspecified trimester, not applicable or unspecified: Secondary | ICD-10-CM

## 2019-02-02 DIAGNOSIS — R7881 Bacteremia: Secondary | ICD-10-CM | POA: Diagnosis not present

## 2019-02-02 DIAGNOSIS — O98813 Other maternal infectious and parasitic diseases complicating pregnancy, third trimester: Secondary | ICD-10-CM | POA: Diagnosis not present

## 2019-02-02 DIAGNOSIS — D649 Anemia, unspecified: Secondary | ICD-10-CM | POA: Diagnosis present

## 2019-02-02 DIAGNOSIS — B9561 Methicillin susceptible Staphylococcus aureus infection as the cause of diseases classified elsewhere: Secondary | ICD-10-CM | POA: Diagnosis not present

## 2019-02-02 DIAGNOSIS — I313 Pericardial effusion (noninflammatory): Secondary | ICD-10-CM | POA: Diagnosis not present

## 2019-02-02 DIAGNOSIS — A4101 Sepsis due to Methicillin susceptible Staphylococcus aureus: Secondary | ICD-10-CM

## 2019-02-02 DIAGNOSIS — O479 False labor, unspecified: Secondary | ICD-10-CM

## 2019-02-02 DIAGNOSIS — I361 Nonrheumatic tricuspid (valve) insufficiency: Secondary | ICD-10-CM | POA: Diagnosis not present

## 2019-02-02 DIAGNOSIS — T80219A Unspecified infection due to central venous catheter, initial encounter: Secondary | ICD-10-CM

## 2019-02-02 DIAGNOSIS — O411230 Chorioamnionitis, third trimester, not applicable or unspecified: Secondary | ICD-10-CM | POA: Diagnosis present

## 2019-02-02 DIAGNOSIS — O42919 Preterm premature rupture of membranes, unspecified as to length of time between rupture and onset of labor, unspecified trimester: Secondary | ICD-10-CM | POA: Diagnosis present

## 2019-02-02 DIAGNOSIS — O411290 Chorioamnionitis, unspecified trimester, not applicable or unspecified: Secondary | ICD-10-CM | POA: Diagnosis present

## 2019-02-02 DIAGNOSIS — A419 Sepsis, unspecified organism: Secondary | ICD-10-CM | POA: Diagnosis present

## 2019-02-02 DIAGNOSIS — O4700 False labor before 37 completed weeks of gestation, unspecified trimester: Secondary | ICD-10-CM

## 2019-02-02 LAB — BLOOD CULTURE ID PANEL (REFLEXED)

## 2019-02-02 LAB — CBC WITH DIFFERENTIAL/PLATELET
Abs Immature Granulocytes: 0.14 10*3/uL — ABNORMAL HIGH (ref 0.00–0.07)
Basophils Absolute: 0 10*3/uL (ref 0.0–0.1)
Basophils Relative: 0 %
Eosinophils Absolute: 0 10*3/uL (ref 0.0–0.5)
Eosinophils Relative: 0 %
HCT: 30.6 % — ABNORMAL LOW (ref 36.0–46.0)
Hemoglobin: 10.6 g/dL — ABNORMAL LOW (ref 12.0–15.0)
Immature Granulocytes: 1 %
Lymphocytes Relative: 4 %
Lymphs Abs: 0.6 10*3/uL — ABNORMAL LOW (ref 0.7–4.0)
MCH: 29.9 pg (ref 26.0–34.0)
MCHC: 34.6 g/dL (ref 30.0–36.0)
MCV: 86.4 fL (ref 80.0–100.0)
Monocytes Absolute: 0.2 10*3/uL (ref 0.1–1.0)
Monocytes Relative: 2 %
Neutro Abs: 13.1 10*3/uL — ABNORMAL HIGH (ref 1.7–7.7)
Neutrophils Relative %: 93 %
Platelets: 193 10*3/uL (ref 150–400)
RBC: 3.54 MIL/uL — ABNORMAL LOW (ref 3.87–5.11)
RDW: 13.5 % (ref 11.5–15.5)
WBC: 14.1 10*3/uL — ABNORMAL HIGH (ref 4.0–10.5)
nRBC: 0 % (ref 0.0–0.2)

## 2019-02-02 LAB — COMPREHENSIVE METABOLIC PANEL
ALT: 15 U/L (ref 0–44)
AST: 30 U/L (ref 15–41)
Albumin: 2.6 g/dL — ABNORMAL LOW (ref 3.5–5.0)
Alkaline Phosphatase: 115 U/L (ref 38–126)
Anion gap: 15 (ref 5–15)
BUN: 5 mg/dL — ABNORMAL LOW (ref 6–20)
CO2: 15 mmol/L — ABNORMAL LOW (ref 22–32)
Calcium: 8.9 mg/dL (ref 8.9–10.3)
Chloride: 103 mmol/L (ref 98–111)
Creatinine, Ser: 0.71 mg/dL (ref 0.44–1.00)
GFR calc Af Amer: >60 mL/min (ref >60)
GFR calc non Af Amer: >60 mL/min (ref >60)
Glucose, Bld: 126 mg/dL — ABNORMAL HIGH (ref 70–99)
Potassium: 3.1 mmol/L — ABNORMAL LOW (ref 3.5–5.1)
Sodium: 133 mmol/L — ABNORMAL LOW (ref 135–145)
Total Bilirubin: 1.7 mg/dL — ABNORMAL HIGH (ref 0.3–1.2)
Total Protein: 5.9 g/dL — ABNORMAL LOW (ref 6.5–8.1)

## 2019-02-02 LAB — LACTIC ACID, PLASMA
Lactic Acid, Venous: 2.2 mmol/L (ref 0.5–1.9)
Lactic Acid, Venous: 2.2 mmol/L (ref 0.5–1.9)
Lactic Acid, Venous: 2.1 mmol/L (ref 0.5–1.9)

## 2019-02-02 LAB — TYPE AND SCREEN
ABO/RH(D): B POS
Antibody Screen: NEGATIVE

## 2019-02-02 LAB — LACTATE DEHYDROGENASE: LDH: 124 U/L (ref 98–192)

## 2019-02-02 LAB — APTT: aPTT: 32 seconds (ref 24–36)

## 2019-02-02 LAB — PROTIME-INR
INR: 1.1 (ref 0.8–1.2)
Prothrombin Time: 14.4 seconds (ref 11.4–15.2)

## 2019-02-02 LAB — POCT FERN TEST: POCT Fern Test: POSITIVE

## 2019-02-02 LAB — GROUP B STREP BY PCR: Group B strep by PCR: NEGATIVE

## 2019-02-02 LAB — ABO/RH: ABO/RH(D): B POS

## 2019-02-02 SURGERY — Surgical Case
Anesthesia: Spinal | Site: Abdomen | Wound class: Clean Contaminated

## 2019-02-02 MED ORDER — FENTANYL CITRATE (PF) 100 MCG/2ML IJ SOLN
INTRAMUSCULAR | Status: AC
  Filled 2019-02-02: qty 2

## 2019-02-02 MED ORDER — DIBUCAINE (PERIANAL) 1 % EX OINT: 1.0000 "application " | TOPICAL_OINTMENT | CUTANEOUS | Status: DC | PRN

## 2019-02-02 MED ORDER — HEPARIN SOD (PORK) LOCK FLUSH 100 UNIT/ML IV SOLN: 250.0000 [IU] | INTRAVENOUS | Status: DC | PRN

## 2019-02-02 MED ORDER — MORPHINE SULFATE (PF) 0.5 MG/ML IJ SOLN
INTRAMUSCULAR | Status: DC | PRN
  Administered 2019-02-02: .15 mg via INTRATHECAL

## 2019-02-02 MED ORDER — LEVOTHYROXINE SODIUM 25 MCG PO TABS
12.5000 ug | ORAL_TABLET | Freq: Every day | ORAL | Status: DC
  Administered 2019-02-03 – 2019-02-06 (×4): 12.5 ug via ORAL
  Filled 2019-02-02 (×7): qty 1

## 2019-02-02 MED ORDER — CEFAZOLIN SODIUM-DEXTROSE 2-4 GM/100ML-% IV SOLN
2.0000 g | Freq: Once | INTRAVENOUS | Status: AC
  Administered 2019-02-02: 2 g via INTRAVENOUS
  Filled 2019-02-02: qty 100

## 2019-02-02 MED ORDER — FENTANYL CITRATE (PF) 100 MCG/2ML IJ SOLN
50.0000 ug | Freq: Once | INTRAMUSCULAR | Status: AC
  Administered 2019-02-02: 50 ug via INTRAVENOUS

## 2019-02-02 MED ORDER — LACTATED RINGERS IV SOLN
INTRAVENOUS | Status: DC
  Administered 2019-02-07: 06:00:00 via INTRAVENOUS

## 2019-02-02 MED ORDER — AZITHROMYCIN 500 MG PO TABS
1000.0000 mg | ORAL_TABLET | Freq: Once | ORAL | Status: AC
  Administered 2019-02-02: 1000 mg via ORAL
  Filled 2019-02-02: qty 2

## 2019-02-02 MED ORDER — ONDANSETRON 4 MG PO TBDP
8.0000 mg | ORAL_TABLET | Freq: Three times a day (TID) | ORAL | Status: DC | PRN
  Administered 2019-02-02: 8 mg via ORAL
  Filled 2019-02-02: qty 2

## 2019-02-02 MED ORDER — LEVOTHYROXINE SODIUM 25 MCG PO TABS
187.5000 ug | ORAL_TABLET | Freq: Every day | ORAL | Status: DC
  Filled 2019-02-02: qty 1.5

## 2019-02-02 MED ORDER — NALOXONE HCL 0.4 MG/ML IJ SOLN: 0.4000 mg | INTRAMUSCULAR | Status: DC | PRN

## 2019-02-02 MED ORDER — OXYCODONE-ACETAMINOPHEN 5-325 MG PO TABS: 2.0000 | ORAL_TABLET | ORAL | Status: DC | PRN

## 2019-02-02 MED ORDER — SODIUM CHLORIDE 0.9% FLUSH: 3.0000 mL | INTRAVENOUS | Status: DC | PRN

## 2019-02-02 MED ORDER — KETOROLAC TROMETHAMINE 30 MG/ML IJ SOLN
INTRAMUSCULAR | Status: AC
  Filled 2019-02-02: qty 1

## 2019-02-02 MED ORDER — LACTATED RINGERS IV BOLUS (SEPSIS)
1000.0000 mL | Freq: Once | INTRAVENOUS | Status: AC
  Administered 2019-02-02: 1000 mL via INTRAVENOUS

## 2019-02-02 MED ORDER — MAGNESIUM SULFATE BOLUS VIA INFUSION
4.0000 g | Freq: Once | INTRAVENOUS | Status: AC
  Administered 2019-02-02: 4 g via INTRAVENOUS
  Filled 2019-02-02: qty 500

## 2019-02-02 MED ORDER — WITCH HAZEL-GLYCERIN EX PADS: 1.0000 "application " | MEDICATED_PAD | CUTANEOUS | Status: DC | PRN

## 2019-02-02 MED ORDER — LEVOTHYROXINE SODIUM 25 MCG PO TABS
175.0000 ug | ORAL_TABLET | Freq: Every day | ORAL | Status: DC
  Administered 2019-02-03 – 2019-02-06 (×4): 175 ug via ORAL
  Filled 2019-02-02 (×3): qty 7

## 2019-02-02 MED ORDER — OXYCODONE-ACETAMINOPHEN 5-325 MG PO TABS: 1.0000 | ORAL_TABLET | ORAL | Status: DC | PRN

## 2019-02-02 MED ORDER — DIPHENHYDRAMINE HCL 25 MG PO CAPS: 25.0000 mg | ORAL_CAPSULE | ORAL | Status: DC | PRN

## 2019-02-02 MED ORDER — SODIUM CHLORIDE 0.9% FLUSH: 10.0000 mL | Freq: Two times a day (BID) | INTRAVENOUS | Status: DC

## 2019-02-02 MED ORDER — SIMETHICONE 80 MG PO CHEW
80.0000 mg | CHEWABLE_TABLET | ORAL | Status: DC | PRN
  Administered 2019-02-05: 80 mg via ORAL
  Filled 2019-02-02: qty 1

## 2019-02-02 MED ORDER — NALBUPHINE HCL 10 MG/ML IJ SOLN: 5.0000 mg | Freq: Once | INTRAMUSCULAR | Status: AC | PRN

## 2019-02-02 MED ORDER — AZITHROMYCIN 250 MG PO TABS
1000.0000 mg | ORAL_TABLET | Freq: Once | ORAL | Status: AC
  Administered 2019-02-03: 01:00:00 1000 mg via ORAL
  Filled 2019-02-02: qty 4

## 2019-02-02 MED ORDER — PROMETHAZINE HCL 25 MG PO TABS: 25.0000 mg | ORAL_TABLET | ORAL | Status: DC | PRN

## 2019-02-02 MED ORDER — ONDANSETRON HCL 4 MG/2ML IJ SOLN
4.0000 mg | Freq: Three times a day (TID) | INTRAMUSCULAR | Status: DC | PRN
  Filled 2019-02-02 (×2): qty 2

## 2019-02-02 MED ORDER — SODIUM CHLORIDE 0.9 % IV SOLN
2.0000 g | Freq: Four times a day (QID) | INTRAVENOUS | Status: DC
  Administered 2019-02-02: 2 g via INTRAVENOUS
  Filled 2019-02-02: qty 2000

## 2019-02-02 MED ORDER — PHENYLEPHRINE 40 MCG/ML (10ML) SYRINGE FOR IV PUSH (FOR BLOOD PRESSURE SUPPORT)
PREFILLED_SYRINGE | INTRAVENOUS | Status: DC | PRN
  Administered 2019-02-02 (×8): 80 ug via INTRAVENOUS

## 2019-02-02 MED ORDER — PROMETHAZINE HCL 25 MG PO TABS: 25.0000 mg | ORAL_TABLET | Freq: Every day | ORAL | Status: DC

## 2019-02-02 MED ORDER — PANTOPRAZOLE SODIUM 40 MG PO TBEC
40.0000 mg | DELAYED_RELEASE_TABLET | Freq: Every day | ORAL | Status: DC
  Administered 2019-02-03 – 2019-02-07 (×4): 40 mg via ORAL
  Filled 2019-02-02 (×4): qty 1

## 2019-02-02 MED ORDER — PHENYLEPHRINE 40 MCG/ML (10ML) SYRINGE FOR IV PUSH (FOR BLOOD PRESSURE SUPPORT)
PREFILLED_SYRINGE | INTRAVENOUS | Status: AC
  Filled 2019-02-02: qty 30

## 2019-02-02 MED ORDER — FENTANYL CITRATE (PF) 100 MCG/2ML IJ SOLN
25.0000 ug | INTRAMUSCULAR | Status: DC | PRN
  Administered 2019-02-02 (×2): 50 ug via INTRAVENOUS

## 2019-02-02 MED ORDER — OXYTOCIN BOLUS FROM INFUSION: 500.0000 mL | Freq: Once | INTRAVENOUS | Status: DC

## 2019-02-02 MED ORDER — PANTOPRAZOLE SODIUM 40 MG PO TBEC
40.0000 mg | DELAYED_RELEASE_TABLET | Freq: Every day | ORAL | Status: DC
  Administered 2019-02-02: 40 mg via ORAL
  Filled 2019-02-02: qty 1

## 2019-02-02 MED ORDER — MEPERIDINE HCL 25 MG/ML IJ SOLN: 6.2500 mg | INTRAMUSCULAR | Status: DC | PRN

## 2019-02-02 MED ORDER — SODIUM CHLORIDE 0.9 % IV SOLN
INTRAVENOUS | Status: DC | PRN
  Administered 2019-02-02: 40 [IU] via INTRAVENOUS

## 2019-02-02 MED ORDER — PHENYLEPHRINE 40 MCG/ML (10ML) SYRINGE FOR IV PUSH (FOR BLOOD PRESSURE SUPPORT)
PREFILLED_SYRINGE | INTRAVENOUS | Status: AC
  Filled 2019-02-02: qty 60

## 2019-02-02 MED ORDER — ONDANSETRON HCL 4 MG/2ML IJ SOLN
INTRAMUSCULAR | Status: DC | PRN
  Administered 2019-02-02: 4 mg via INTRAVENOUS

## 2019-02-02 MED ORDER — MORPHINE SULFATE (PF) 0.5 MG/ML IJ SOLN
INTRAMUSCULAR | Status: AC
  Filled 2019-02-02: qty 10

## 2019-02-02 MED ORDER — LIDOCAINE HCL (PF) 1 % IJ SOLN: 30.0000 mL | INTRAMUSCULAR | Status: DC | PRN

## 2019-02-02 MED ORDER — DIPHENHYDRAMINE HCL 50 MG/ML IJ SOLN: 12.5000 mg | INTRAMUSCULAR | Status: DC | PRN

## 2019-02-02 MED ORDER — PHENYLEPHRINE HCL-NACL 20-0.9 MG/250ML-% IV SOLN
INTRAVENOUS | Status: AC
  Filled 2019-02-02: qty 250

## 2019-02-02 MED ORDER — SODIUM CHLORIDE 0.9 % IV SOLN
INTRAVENOUS | Status: AC
  Filled 2019-02-02: qty 2000

## 2019-02-02 MED ORDER — HEPARIN SOD (PORK) LOCK FLUSH 100 UNIT/ML IV SOLN: 250.0000 [IU] | Freq: Every day | INTRAVENOUS | Status: DC

## 2019-02-02 MED ORDER — KETOROLAC TROMETHAMINE 30 MG/ML IJ SOLN
30.0000 mg | Freq: Four times a day (QID) | INTRAMUSCULAR | Status: DC | PRN
  Administered 2019-02-02: 30 mg via INTRAVENOUS

## 2019-02-02 MED ORDER — SODIUM CHLORIDE 0.9 % IV SOLN
INTRAVENOUS | Status: DC | PRN
  Administered 2019-02-02: 60 ug/min via INTRAVENOUS

## 2019-02-02 MED ORDER — STERILE WATER FOR IRRIGATION IR SOLN
Status: DC | PRN
  Administered 2019-02-02: 1000 mL

## 2019-02-02 MED ORDER — LACTATED RINGERS IV SOLN: 500.0000 mL | INTRAVENOUS | Status: DC | PRN

## 2019-02-02 MED ORDER — NALBUPHINE HCL 10 MG/ML IJ SOLN
5.0000 mg | Freq: Once | INTRAMUSCULAR | Status: AC | PRN
  Administered 2019-02-02: 19:00:00 5 mg via SUBCUTANEOUS
  Filled 2019-02-02: qty 1

## 2019-02-02 MED ORDER — NALBUPHINE HCL 10 MG/ML IJ SOLN: 5.0000 mg | INTRAMUSCULAR | Status: DC | PRN

## 2019-02-02 MED ORDER — DEXAMETHASONE SODIUM PHOSPHATE 4 MG/ML IJ SOLN
INTRAMUSCULAR | Status: DC | PRN
  Administered 2019-02-02: 8 mg via INTRAVENOUS

## 2019-02-02 MED ORDER — PHENYLEPHRINE 40 MCG/ML (10ML) SYRINGE FOR IV PUSH (FOR BLOOD PRESSURE SUPPORT)
PREFILLED_SYRINGE | INTRAVENOUS | Status: AC
  Filled 2019-02-02: qty 10

## 2019-02-02 MED ORDER — OXYCODONE-ACETAMINOPHEN 5-325 MG PO TABS
1.0000 | ORAL_TABLET | ORAL | Status: DC | PRN
  Administered 2019-02-02 – 2019-02-06 (×18): 1 via ORAL
  Filled 2019-02-02 (×12): qty 1
  Filled 2019-02-02: qty 2
  Filled 2019-02-02 (×5): qty 1

## 2019-02-02 MED ORDER — TETANUS-DIPHTH-ACELL PERTUSSIS 5-2.5-18.5 LF-MCG/0.5 IM SUSP: 0.5000 mL | Freq: Once | INTRAMUSCULAR | Status: DC

## 2019-02-02 MED ORDER — SENNOSIDES-DOCUSATE SODIUM 8.6-50 MG PO TABS
2.0000 | ORAL_TABLET | ORAL | Status: DC
  Administered 2019-02-02 – 2019-02-06 (×5): 2 via ORAL
  Filled 2019-02-02 (×5): qty 2

## 2019-02-02 MED ORDER — SIMETHICONE 80 MG PO CHEW
80.0000 mg | CHEWABLE_TABLET | Freq: Three times a day (TID) | ORAL | Status: DC
  Administered 2019-02-02 – 2019-02-07 (×13): 80 mg via ORAL
  Filled 2019-02-02 (×13): qty 1

## 2019-02-02 MED ORDER — BUPIVACAINE IN DEXTROSE 0.75-8.25 % IT SOLN
INTRATHECAL | Status: DC | PRN
  Administered 2019-02-02: 1.8 mg via INTRATHECAL

## 2019-02-02 MED ORDER — SCOPOLAMINE 1 MG/3DAYS TD PT72
MEDICATED_PATCH | TRANSDERMAL | Status: DC | PRN
  Administered 2019-02-02: 1 via TRANSDERMAL

## 2019-02-02 MED ORDER — DIPHENHYDRAMINE HCL 25 MG PO CAPS
25.0000 mg | ORAL_CAPSULE | Freq: Four times a day (QID) | ORAL | Status: DC | PRN
  Administered 2019-02-03: 25 mg via ORAL
  Filled 2019-02-02: qty 1

## 2019-02-02 MED ORDER — MENTHOL 3 MG MT LOZG
1.0000 | LOZENGE | OROMUCOSAL | Status: DC | PRN
  Administered 2019-02-05: 3 mg via ORAL
  Filled 2019-02-02: qty 9

## 2019-02-02 MED ORDER — SIMETHICONE 80 MG PO CHEW
80.0000 mg | CHEWABLE_TABLET | ORAL | Status: DC
  Administered 2019-02-02 – 2019-02-06 (×5): 80 mg via ORAL
  Filled 2019-02-02 (×5): qty 1

## 2019-02-02 MED ORDER — FENTANYL CITRATE (PF) 100 MCG/2ML IJ SOLN
50.0000 ug | Freq: Once | INTRAMUSCULAR | Status: AC
  Administered 2019-02-02: 50 ug via INTRAVENOUS
  Filled 2019-02-02: qty 2

## 2019-02-02 MED ORDER — SODIUM CHLORIDE 0.9 % IV SOLN
2.0000 g | Freq: Four times a day (QID) | INTRAVENOUS | Status: DC
  Administered 2019-02-02 (×3): 2 g via INTRAVENOUS
  Filled 2019-02-02 (×2): qty 2000

## 2019-02-02 MED ORDER — OXYTOCIN 40 UNITS IN NORMAL SALINE INFUSION - SIMPLE MED: 2.5000 [IU]/h | INTRAVENOUS | Status: AC

## 2019-02-02 MED ORDER — LACTATED RINGERS IV SOLN
INTRAVENOUS | Status: DC | PRN
  Administered 2019-02-02 (×3): via INTRAVENOUS

## 2019-02-02 MED ORDER — PROMETHAZINE HCL 25 MG/ML IJ SOLN: 6.2500 mg | INTRAMUSCULAR | Status: DC | PRN

## 2019-02-02 MED ORDER — COCONUT OIL OIL
1.0000 "application " | TOPICAL_OIL | Status: DC | PRN
  Administered 2019-02-03: 1 via TOPICAL

## 2019-02-02 MED ORDER — ACETAMINOPHEN 325 MG PO TABS: 650.0000 mg | ORAL_TABLET | ORAL | Status: DC | PRN

## 2019-02-02 MED ORDER — SOD CITRATE-CITRIC ACID 500-334 MG/5ML PO SOLN: 30.0000 mL | ORAL | Status: DC | PRN

## 2019-02-02 MED ORDER — SCOPOLAMINE 1 MG/3DAYS TD PT72
MEDICATED_PATCH | TRANSDERMAL | Status: AC
  Filled 2019-02-02: qty 1

## 2019-02-02 MED ORDER — SODIUM CHLORIDE 0.9 % IR SOLN
Status: DC | PRN
  Administered 2019-02-02: 1000 mL

## 2019-02-02 MED ORDER — FAMOTIDINE 20 MG PO TABS
10.0000 mg | ORAL_TABLET | Freq: Every day | ORAL | Status: DC
  Administered 2019-02-02: 10 mg via ORAL
  Filled 2019-02-02: qty 1

## 2019-02-02 MED ORDER — KETOROLAC TROMETHAMINE 30 MG/ML IJ SOLN: 30.0000 mg | Freq: Four times a day (QID) | INTRAMUSCULAR | Status: DC | PRN

## 2019-02-02 MED ORDER — PRENATAL MULTIVITAMIN CH
1.0000 | ORAL_TABLET | Freq: Every day | ORAL | Status: DC
  Administered 2019-02-05 – 2019-02-07 (×3): 1 via ORAL
  Filled 2019-02-02 (×3): qty 1

## 2019-02-02 MED ORDER — FENTANYL CITRATE (PF) 100 MCG/2ML IJ SOLN
INTRAMUSCULAR | Status: DC | PRN
  Administered 2019-02-02: 25 ug via INTRAVENOUS
  Administered 2019-02-02: 35 ug via INTRAVENOUS
  Administered 2019-02-02: 15 ug via INTRATHECAL
  Administered 2019-02-02: 25 ug via INTRAVENOUS

## 2019-02-02 MED ORDER — AMOXICILLIN 500 MG PO CAPS: 500.0000 mg | ORAL_CAPSULE | Freq: Three times a day (TID) | ORAL | Status: DC

## 2019-02-02 MED ORDER — SODIUM CHLORIDE 0.9% FLUSH
10.0000 mL | INTRAVENOUS | Status: DC | PRN
  Administered 2019-02-02: 10 mL
  Filled 2019-02-02: qty 40

## 2019-02-02 MED ORDER — BETAMETHASONE SOD PHOS & ACET 6 (3-3) MG/ML IJ SUSP
12.0000 mg | INTRAMUSCULAR | Status: DC
  Administered 2019-02-02: 12 mg via INTRAMUSCULAR
  Filled 2019-02-02 (×2): qty 2

## 2019-02-02 MED ORDER — GENTAMICIN SULFATE 40 MG/ML IJ SOLN
5.0000 mg/kg | INTRAVENOUS | Status: DC
  Administered 2019-02-02: 330 mg via INTRAVENOUS
  Filled 2019-02-02 (×2): qty 8.25

## 2019-02-02 MED ORDER — LACTATED RINGERS IV BOLUS (SEPSIS)
250.0000 mL | Freq: Once | INTRAVENOUS | Status: AC
  Administered 2019-02-02: 250 mL via INTRAVENOUS

## 2019-02-02 MED ORDER — ZOLPIDEM TARTRATE 5 MG PO TABS: 5.0000 mg | ORAL_TABLET | Freq: Every evening | ORAL | Status: DC | PRN

## 2019-02-02 MED ORDER — ACETAMINOPHEN 325 MG PO TABS
650.0000 mg | ORAL_TABLET | ORAL | Status: DC | PRN
  Administered 2019-02-02: 650 mg via ORAL
  Filled 2019-02-02: qty 2

## 2019-02-02 MED ORDER — OXYTOCIN 40 UNITS IN NORMAL SALINE INFUSION - SIMPLE MED: 2.5000 [IU]/h | INTRAVENOUS | Status: DC

## 2019-02-02 MED ORDER — ONDANSETRON HCL 4 MG/2ML IJ SOLN
4.0000 mg | Freq: Four times a day (QID) | INTRAMUSCULAR | Status: DC | PRN
  Administered 2019-02-02: 08:00:00 4 mg via INTRAVENOUS
  Filled 2019-02-02: qty 2

## 2019-02-02 MED ORDER — NALOXONE HCL 4 MG/10ML IJ SOLN
1.0000 ug/kg/h | INTRAVENOUS | Status: DC | PRN
  Filled 2019-02-02: qty 5

## 2019-02-02 MED ORDER — MAGNESIUM SULFATE 40 G IN LACTATED RINGERS - SIMPLE
2.0000 g/h | INTRAVENOUS | Status: DC
  Administered 2019-02-02: 2 g/h via INTRAVENOUS
  Filled 2019-02-02: qty 500

## 2019-02-02 MED ORDER — SODIUM CHLORIDE 0.9 % IV SOLN
INTRAVENOUS | Status: DC | PRN
  Administered 2019-02-02: 12:00:00 via INTRAVENOUS

## 2019-02-02 MED ORDER — LACTATED RINGERS IV SOLN
INTRAVENOUS | Status: DC
  Administered 2019-02-02: 09:00:00 via INTRAVENOUS

## 2019-02-02 SURGICAL SUPPLY — 33 items
BENZOIN TINCTURE PRP APPL 2/3 (GAUZE/BANDAGES/DRESSINGS) ×3 IMPLANT
CHLORAPREP W/TINT 26ML (MISCELLANEOUS) ×3 IMPLANT
CLAMP CORD UMBIL (MISCELLANEOUS) IMPLANT
CLOSURE WOUND 1/2 X4 (GAUZE/BANDAGES/DRESSINGS) ×1
CLOTH BEACON ORANGE TIMEOUT ST (SAFETY) ×3 IMPLANT
DERMABOND ADVANCED (GAUZE/BANDAGES/DRESSINGS)
DERMABOND ADVANCED .7 DNX12 (GAUZE/BANDAGES/DRESSINGS) IMPLANT
DRSG OPSITE POSTOP 4X10 (GAUZE/BANDAGES/DRESSINGS) ×3 IMPLANT
ELECT REM PT RETURN 9FT ADLT (ELECTROSURGICAL) ×3
ELECTRODE REM PT RTRN 9FT ADLT (ELECTROSURGICAL) ×1 IMPLANT
EXTRACTOR VACUUM KIWI (MISCELLANEOUS) IMPLANT
GLOVE BIO SURGEON STRL SZ 6 (GLOVE) ×3 IMPLANT
GLOVE BIOGEL PI IND STRL 6 (GLOVE) ×2 IMPLANT
GLOVE BIOGEL PI IND STRL 7.0 (GLOVE) ×1 IMPLANT
GLOVE BIOGEL PI INDICATOR 6 (GLOVE) ×4
GLOVE BIOGEL PI INDICATOR 7.0 (GLOVE) ×2
GOWN STRL REUS W/TWL LRG LVL3 (GOWN DISPOSABLE) ×6 IMPLANT
KIT ABG SYR 3ML LUER SLIP (SYRINGE) ×3 IMPLANT
NEEDLE HYPO 25X5/8 SAFETYGLIDE (NEEDLE) ×3 IMPLANT
NS IRRIG 1000ML POUR BTL (IV SOLUTION) ×3 IMPLANT
PACK C SECTION WH (CUSTOM PROCEDURE TRAY) ×3 IMPLANT
PAD OB MATERNITY 4.3X12.25 (PERSONAL CARE ITEMS) ×3 IMPLANT
PENCIL SMOKE EVAC W/HOLSTER (ELECTROSURGICAL) ×3 IMPLANT
STRIP CLOSURE SKIN 1/2X4 (GAUZE/BANDAGES/DRESSINGS) ×2 IMPLANT
SUT CHROMIC 0 CTX 36 (SUTURE) ×9 IMPLANT
SUT MON AB 2-0 CT1 27 (SUTURE) ×3 IMPLANT
SUT PDS AB 0 CT1 27 (SUTURE) IMPLANT
SUT PLAIN 0 NONE (SUTURE) IMPLANT
SUT VIC AB 0 CT1 36 (SUTURE) IMPLANT
SUT VIC AB 4-0 KS 27 (SUTURE) IMPLANT
TOWEL OR 17X24 6PK STRL BLUE (TOWEL DISPOSABLE) ×3 IMPLANT
TRAY FOLEY W/BAG SLVR 14FR LF (SET/KITS/TRAYS/PACK) IMPLANT
WATER STERILE IRR 1000ML POUR (IV SOLUTION) ×3 IMPLANT

## 2019-02-02 NOTE — Consult Note (Signed)
Telemedicine MFM Consult  Requesting Provider: Linda Hedges, DO Date of request 0816/20 Reason for request: [facility] with fever  Heather [name] is a 33 yo G5P1 at 3 w 4 d she is consultation today at the request of Dr. Linda Hedges for evaluation and management of PPPROM with fever.  Heather [name] has been to the MAU recently with complaints of abdominal pain and uterine contractions. Her pregnancy is complicated by hyperemesis in which she has a persistent PICC line in place since 08/2018. She has also been treated with prophylactic heparin.  She has been seen multiple time in the ED or MAU since July with various complaints of chest pain, nausea and vomiting and Neck pain. The general work up has including upper neck and chest vascular studies, labs, and CXR which have returned negative.  She denies abdominal pain or chest pain.   Vitals:   02/02/19 0801 02/02/19 0806 02/02/19 0811 02/02/19 0832  BP: (!) 95/57 (!) 88/53 (!) 90/53 (!) 97/54  Pulse: (!) 123 (!) 124 (!) 124 (!) 118  Resp: 20     Temp:      TempSrc:      SpO2: 98% 100% 99%   Weight:       CMP Latest Ref Rng & Units 02/02/2019 01/03/2019 12/30/2018  Glucose 70 - 99 mg/dL 126(H) 148(H) 102(H)  BUN 6 - 20 mg/dL 5(L) 6 7  Creatinine 0.44 - 1.00 mg/dL 0.71 0.58 0.61  Sodium 135 - 145 mmol/L 133(L) 137 136  Potassium 3.5 - 5.1 mmol/L 3.1(L) 3.3(L) 3.7  Chloride 98 - 111 mmol/L 103 106 105  CO2 22 - 32 mmol/L 15(L) 21(L) 21(L)  Calcium 8.9 - 10.3 mg/dL 8.9 8.7(L) 8.9  Total Protein 6.5 - 8.1 g/dL 5.9(L) - -  Total Bilirubin 0.3 - 1.2 mg/dL 1.7(H) - -  Alkaline Phos 38 - 126 U/L 115 - -  AST 15 - 41 U/L 30 - -  ALT 0 - 44 U/L 15 - -   CBC Latest Ref Rng & Units 02/02/2019 01/03/2019 12/30/2018  WBC 4.0 - 10.5 K/uL 14.1(H) 7.1 12.1(H)  Hemoglobin 12.0 - 15.0 g/dL 10.6(L) 9.8(L) 11.8(L)  Hematocrit 36.0 - 46.0 % 30.6(L) 28.2(L) 33.8(L)  Platelets 150 - 400 K/uL 193 166 186      Past Medical History:  Diagnosis Date  . Anginal  pain ([place])    cardiac workup normal  . Anxiety   . Chlamydia    in college  . Chronic kidney disease    hx kidney stone  . Depression   . GERD (gastroesophageal reflux disease)    with pregnancy  . Hashimoto's disease   . Headache    migraines  . History of ovarian cyst    ruptured  . Hx of varicella   . Hypothyroidism    borderline  . PONV (postoperative nausea and vomiting)    Past Surgical History:  Procedure Laterality Date  . DILATION AND EVACUATION N/A 08/27/2014   Procedure: DILATATION AND EVACUATION WITH CHROMOSOMES STUDES, ULTRASOUND;  Surgeon: Everlene Farrier, MD;  Location: [facility];  Service: Gynecology;  Laterality: N/A;  . DILATION AND EVACUATION N/A 01/30/2018   Procedure: DILATATION AND EVACUATION WITH GENETICS, ULTRASOUND GUIDANCE;  Surgeon: Everlene Farrier, MD;  Location: [facility];  Service: Gynecology;  Laterality: N/A;  GENETICS, ULTRASOUND GUIDANCE, 12 WEEK DEMISE  . LAPAROSCOPIC APPENDECTOMY N/A 01/13/2014   Procedure: APPENDECTOMY LAPAROSCOPIC;  Surgeon: Gwenyth Ober, MD;  Location: [facility];  Service: General;  Laterality: N/A;  .  TOOTH EXTRACTION     Social History   Socioeconomic History  . Marital status: Married    Spouse name: Not on file  . Number of children: Not on file  . Years of education: Not on file  . Highest education level: Not on file  Occupational History  . Not on file  Social Needs  . Financial resource strain: Not on file  . Food insecurity    Worry: Not on file    Inability: Not on file  . Transportation needs    Medical: Not on file    Non-medical: Not on file  Tobacco Use  . Smoking status: Never Smoker  . Smokeless tobacco: Never Used  Substance and Sexual Activity  . Alcohol use: Not Currently  . Drug use: No  . Sexual activity: Yes  Lifestyle  . Physical activity    Days per week: Not on file    Minutes per session: Not on file  . Stress: Not on file  Relationships  . Social Musicianconnections    Talks on phone: Not on file     Gets together: Not on file    Attends religious service: Not on file    Active member of club or organization: Not on file    Attends meetings of clubs or organizations: Not on file    Relationship status: Not on file  . Intimate partner violence    Fear of current or ex partner: Not on file    Emotionally abused: Not on file    Physically abused: Not on file    Forced sexual activity: Not on file  Other Topics Concern  . Not on file  Social History Narrative  . Not on file   No current facility-administered medications on file prior to encounter.    Current Outpatient Medications on File Prior to Encounter  Medication Sig Dispense Refill  . acetaminophen (TYLENOL) 500 MG tablet Take 500 mg by mouth every 6 (six) hours as needed.    Marland Kitchen. BONJESTA 20-20 MG TBCR Take 1 tablet by mouth 2 (two) times a day.    . docusate sodium (COLACE) 100 MG capsule Take 100 mg by mouth 2 (two) times a day.    . famotidine (PEPCID) 10 MG tablet Take 10 mg by mouth daily.    Marland Kitchen. levothyroxine (SYNTHROID, LEVOTHROID) 25 MCG tablet Take 12.5 mcg by mouth daily before breakfast. Take with 1 tablet of 175 mcg to equal 187.5 mcg    . NIFEdipine (PROCARDIA XL) 30 MG 24 hr tablet Take 1 tablet (30 mg total) by mouth daily. 30 tablet 3  . pantoprazole (PROTONIX) 40 MG tablet Take 1 tablet by mouth daily.  12  . promethazine (PHENERGAN) 25 MG tablet Take 25 mg by mouth 6 (six) times daily.    Marland Kitchen. SYNTHROID 175 MCG tablet Take 175 mcg by mouth daily. Take with 1/2 tablet of 25mcg to equal 187.5 mcg    . heparin flush 10 UNIT/ML SOLN injection Inject 5,000 Units into the vein 2 (two) times a day. Uses to keep Picc Line open    . HYDROcodone-acetaminophen (NORCO) 5-325 MG tablet Take 1-2 tablets by mouth every 6 (six) hours as needed. 15 tablet 0  . ondansetron (ZOFRAN-ODT) 8 MG disintegrating tablet Take 8 mg by mouth every 8 (eight) hours as needed for nausea.      OB History  Gravida Para Term Preterm AB Living  5  1 1  0 3 1  SAB TAB Ectopic Multiple Live  Births  3 0 0 0 1    # Outcome Date GA Lbr Len/2nd Weight Sex Delivery Anes PTL Lv  5 Current           4 Term 12/28/15 6053w3d / 02:34 3940 g F Vag-Spont EPI, Local  LIV     Name: Teel,GIRL Adalia     Apgar1: 7  Apgar5: 9  3 SAB           2 SAB           1 SAB            Family History  Problem Relation Age of Onset  . Hypertension Mother   . Diabetes Mother   . Cancer Maternal Grandmother   . Cancer Maternal Grandfather   . Rheum arthritis Maternal Grandfather     Covid neg test  Imaging: -pending  Impression/Plan:  PPROM with possible chorioamnionitis.   Recommendations: 1) Initiate PPPROM protocol- antibiotics, BMZ, Magnesium 2) Complete Sepsis work up including medicine consultation -CXR ,blood cultures, urine culture 3) R/o out PICC line infection and possible UE DVT 4) Consider delivery after BMZ if Sepsis work up negative 5) Fetal ultrasound for position, anatomy and amniotic fluid assessment 6) Continuous monitoring.  I spent 15  With  >50% in non face to face consultation and care coordination   Prior to delivery I did not have a chance to speak with Ms. Brita Rompdlin but spoke with Dr. Langston MaskerMorris who ultimately decided to deliver due to non reassuring fetal status  Lenae Wherley Unk LightningJ. Davarius Ridener, MD

## 2019-02-02 NOTE — Plan of Care (Signed)
  Problem: Clinical Measurements: Goal: Ability to maintain clinical measurements within normal limits will improve Outcome: Progressing  Will closely monitor temp, work towards ambulation. Pt. To challenge diet with appropriate food choices this PM.

## 2019-02-02 NOTE — H&P (Addendum)
Heather [name] is a 33 y.o. female [phone] at 27w4dwith pregnancy complicated by hyperemesis (PICC in place since 7w GA) and PAG/4G (heparin 5000 u BID) presenting with PPROM around 0430 today.  Patient presented to MAU yesterday afternoon with c/o of PTC.  At that time, her cervix was FT and thick; negative FFN.  Patient was discharged with Procardia.  CTX persisted and on re-presentation to MAU with PPROM (cvx 1.5 cm, thick), patient was noted to have fever 103.  Sepsis work-up was initiated.  Patient has chronic right neck pain which was evaluated with CT one month ago to rule out thrombus; negative for PE.  RUE study, although incomplete, no clot was visualized and patient has improved with time and Flexeril for presumed musculoskeletal etiology.  Patient denies cough, CP/SOB, abdominal pain (aside from CTX), leg pain or swelling.  She has no sick contacts.  COVID testing in MAU yesterday was negative.  Last heparin dose 2100 yesterday.  PCN "allergy" is family hx of this; no personal h/o PCN allergy.  Patient also has hypothyroidism managed by Dr. AElyse Hsuwith Synthroid.     OB History    Gravida  5   Para  1   Term  1   Preterm      AB  3   Living  1     SAB  3   TAB      Ectopic      Multiple  0   Live Births  1          Past Medical History:  Diagnosis Date  . [place]    cardiac workup normal  . Anxiety   . Chlamydia    in college  . Chronic kidney disease    hx kidney stone  . Depression   . GERD (gastroesophageal reflux disease)    with pregnancy  . Hashimoto's disease   . Headache    migraines  . History of ovarian cyst    ruptured  . Hx of varicella   . Hypothyroidism    borderline  . PONV (postoperative nausea and vomiting)    Past Surgical History:  Procedure Laterality Date  . DILATION AND EVACUATION N/A 08/27/2014   Procedure: DILATATION AND EVACUATION WITH CHROMOSOMES STUDES, ULTRASOUND;  Surgeon: JEverlene Farrier MD;  Location[facility];  Service: Gynecology;  Laterality: N/A;  . DILATION AND EVACUATION N/A 01/30/2018   Procedure: DILATATION AND EVACUATION WITH GENETICS, ULTRASOUND GUIDANCE;  Surgeon: TEverlene Farrier MD;  [facility];  Service: Gynecology;  Laterality: N/A;  GENETICS, ULTRASOUND GUIDANCE, 12 WEEK DEMISE  . LAPAROSCOPIC APPENDECTOMY N/A 01/13/2014   Procedure: APPENDECTOMY LAPAROSCOPIC;  Surgeon: JGwenyth Ober MD;  Location: [facility]  Service: General;  Laterality: N/A;  . TOOTH EXTRACTION     Family History: family history includes Cancer in her maternal grandfather and maternal grandmother; Diabetes in her mother; Hypertension in her mother; Rheum arthritis in her maternal grandfather. Social History:  reports that she has never smoked. She has never used smokeless tobacco. She reports previous alcohol use. She reports that she does not use drugs.     Maternal Diabetes: No Genetic Screening: Normal Maternal Ultrasounds/Referrals: Normal Fetal Ultrasounds or other Referrals:  None Maternal Substance Abuse:  No Significant Maternal Medications:  Meds include: Syntroid Significant Maternal Lab Results:  None Other Comments:  None  Review of Systems  Constitutional: Positive for chills, fever and weight loss.  HENT: Negative.   Eyes: Negative.   Respiratory:  Negative for cough, hemoptysis, sputum production and shortness of breath.   Cardiovascular: Negative.        Improved right musculoskeletal neck pain; occasionally causes referred chest pain  Gastrointestinal: Positive for nausea and vomiting.  Genitourinary: Negative.   Musculoskeletal: Positive for neck pain.  Skin:       RUE mild erythema, PICC LUE  Neurological: Negative.   Endo/Heme/Allergies: Negative.   Psychiatric/Behavioral: Negative.    Maternal Medical History:  Reason for admission: Rupture of membranes and nausea.   Contractions: Onset was 13-24 hours ago.   Frequency: regular.   Perceived severity is moderate.     Fetal activity: Perceived fetal activity is normal.   Last perceived fetal movement was within the past hour.    Prenatal complications: Thrombophilia.   Prenatal Complications - Diabetes: none.    Dilation: 1.5 Exam by:: Vernice Jefferson, NP Blood pressure (!) 110/53, pulse (!) 129, temperature (!) 102.3 F (39.1 C), temperature source Oral, resp. rate 20, weight 73.5 kg, SpO2 100 %, unknown if currently breastfeeding. Maternal Exam:  Uterine Assessment: Contraction strength is mild.  Contraction frequency is irregular.   Abdomen: Patient reports no abdominal tenderness. Fundal height is c/w dates.    Introitus: Ferning test: positive.      Fetal Exam Fetal Monitor Review: Baseline rate: 150.  Variability: moderate (6-25 bpm).   Pattern: accelerations present and variable decelerations.       Physical Exam  Constitutional: She is oriented to person, place, and time. She appears well-developed and well-nourished.  Respiratory: Effort normal.  GI: Soft. There is no abdominal tenderness. There is no rebound and no guarding.  Neurological: She is alert and oriented to person, place, and time.  Skin: Skin is warm and dry.  Psychiatric: She has a normal mood and affect. Her behavior is normal.  PICC site without erythema, induration or drainage. RUE with minimal erythema; inner upper arm; no pain Bilateral LE without erythema, edema   Prenatal labs: ABO, Rh:  B pos Antibody:  negative Rubella:  Immune RPR:   NR HBsAg:  NR  HIV:   NR GBS:  unk  Assessment/Plan: 32yo [id number] at 19w4dwith PPROM, presumed chorioamnionitis -Sepsis w/u (blood cx were drawn prior to initiation of abx) -Latency abx -BMZ -Magnesium for neuroprotection -MFM u/s and consult (spoke directly with Dr. BGertie Exon -NICU consult -GBS collected -RUE Doppler  -SCDs -Hold heparin for now   MLinda Hedges8/16/2020, 7:30 AM

## 2019-02-02 NOTE — Progress Notes (Signed)
Bedside u/s breech presentation.  Patient counseled that she will require C/S for delivery given breech.  Patient understands.    Linda Hedges, DO

## 2019-02-02 NOTE — Addendum Note (Signed)
Addendum  created 02/02/19 1453 by Josephine Igo, MD   Child order released for a procedure order, Clinical Note Signed, Intraprocedure Blocks edited

## 2019-02-02 NOTE — Transfer of Care (Signed)
Immediate Anesthesia Transfer of Care Note  Patient: Heather [name]  Procedure(s) Performed: CESAREAN SECTION (N/A Abdomen)  Patient Location: PACU  Anesthesia Type:Spinal  Level of Consciousness: awake, alert , oriented and patient cooperative  Airway & Oxygen Therapy: Patient Spontanous Breathing  Post-op Assessment: Report given to RN and Post -op Vital signs reviewed and stable  Post vital signs: Reviewed and stable  Last Vitals:  Vitals Value Taken Time  BP    Temp    Pulse    Resp    SpO2      Last Pain:  Vitals:   02/02/19 1100  TempSrc: Oral  PainSc: 8          Complications: No apparent anesthesia complications

## 2019-02-02 NOTE — Progress Notes (Signed)
CRITICAL VALUE ALERT  Critical Value: Venous Lactic Acid - 2.2    Date & Time Notied: 02/02/19 @ 1912   Provider Notified: Dr. Lynnette Caffey  Orders Received/Actions taken: Cont. With POC, amp/gent for 24 hrs. No new at this time.

## 2019-02-02 NOTE — Op Note (Signed)
Heather [name] PROCEDURE DATE: 02/02/2019  PREOPERATIVE DIAGNOSIS: Intrauterine pregnancy at  [redacted]w[redacted]d weeks gestation, PPROM with chorioamnionitis, non-reassuring fetal status  POSTOPERATIVE DIAGNOSIS: The same  PROCEDURE: Primary Low Transverse Cesarean Section  SURGEON:  Dr. Linda Hedges  INDICATIONS: Heather [name] is a 33 y.o. [id number] at [redacted]w[redacted]d scheduled for cesarean section secondary to non-reassuring fetal status, PPROM with chorioamnionitis.  The risks of cesarean section discussed with the patient included but were not limited to: bleeding which may require transfusion or reoperation; infection which may require antibiotics; injury to bowel, bladder, ureters or other surrounding organs; injury to the fetus; need for additional procedures including hysterectomy in the event of a life-threatening hemorrhage; placental abnormalities wth subsequent pregnancies, incisional problems, thromboembolic phenomenon and other postoperative/anesthesia complications. The patient concurred with the proposed plan, giving informed written consent for the procedure.    FINDINGS:  Viable female infant in breech presentation, APGARs 2,7: weight 2#12  Scant, clear amniotic fluid.  Intact placenta, three vessel cord.  Grossly normal uterus, ovaries and fallopian tubes. .   ANESTHESIA:  Spinal ESTIMATED BLOOD LOSS: 370 mL ml SPECIMENS: Placenta sent to pathology COMPLICATIONS: None immediate  PROCEDURE IN DETAIL:  The patient received intravenous antibiotics and had sequential compression devices applied to her lower extremities while in the preoperative area.  She was then taken to the operating room where spinal anesthesia was administered and was found to be adequate. She was then placed in a dorsal supine position with a leftward tilt, and prepped and draped in a sterile manner.  A foley catheter was placed into her bladder and attached to constant gravity.  After an adequate timeout was performed, a  Pfannenstiel skin incision was made with scalpel and carried through to the underlying layer of fascia. The fascia was incised in the midline and this incision was extended bilaterally using the Mayo scissors. Kocher clamps were applied to the superior aspect of the fascial incision and the underlying rectus muscles were dissected off bluntly. A similar process was carried out on the inferior aspect of the facial incision. The rectus muscles were separated in the midline bluntly and the peritoneum was entered bluntly.   A transverse hysterotomy was made with a scalpel and extended bilaterally bluntly. The bladder blade was then removed. The infant was successfully delivered using typical breech maneuvers, and cord was clamped and cut and infant was handed over to awaiting neonatology team. Uterine massage was then administered and the placenta delivered intact with three-vessel cord. The uterus was cleared of clot and debris.  The hysterotomy was closed with 0 chromic.  A second imbricating suture of 0-chromic was used to reinforce the incision and aid in hemostasis.  The peritoneum and rectus muscles were noted to be hemostatic and were reapproximated using 3-0 monocryl in a running fashion.  The fascia was closed with 0-Vicryl in a running fashion with good restoration of anatomy.  The subcutaneus tissue was copiously irrigated.  The skin was closed with 4-0 vicryl in a subcuticular fashion.  Pt tolerated the procedure will.  All counts were correct x2.  Pt went to the recovery room in stable condition.

## 2019-02-02 NOTE — Plan of Care (Signed)
Bedside vascular US being done [phone]

## 2019-02-02 NOTE — Progress Notes (Signed)
Patient is s/p fentanyl x 2.  Reports minimal improvement in pain with CTX.   CVX unchanged at 1.5/25/-3.  Clear amniotic fluid; no blood Hypotension suspected to be related to baseline low BP lowered with medications (magnesium and fentanyl) but given chorio, will consult hospitalist to assist with sepsis evaluation and management.    Linda Hedges, DO

## 2019-02-02 NOTE — MAU Note (Signed)
Pt here for SROM. Reports she had some trickling about an hour prior to arrival and then a gush about 30 mins prior to arrival. Pt here earlier for PTL and given procardia. Reports contractions every 5 mins. Reports decreased fetal movement.

## 2019-02-02 NOTE — Anesthesia Postprocedure Evaluation (Signed)
Anesthesia Post Note  Patient: Heather [name]  Procedure(s) Performed: CESAREAN SECTION (N/A Abdomen)     Patient location during evaluation: PACU Anesthesia Type: Spinal Level of consciousness: oriented and awake and alert Pain management: pain level controlled Vital Signs Assessment: post-procedure vital signs reviewed and stable Respiratory status: spontaneous breathing, respiratory function stable and nonlabored ventilation Cardiovascular status: blood pressure returned to baseline and stable Postop Assessment: no headache, no backache, spinal receding and patient able to bend at knees Anesthetic complications: no    Last Vitals:  Vitals:   02/02/19 1345 02/02/19 1354  BP: 97/61   Pulse: (!) 108   Resp: 18   Temp:    SpO2: 98% 99%    Last Pain:  Vitals:   02/02/19 1354  TempSrc:   PainSc: 4    Pain Goal:                Epidural/Spinal Function Cutaneous sensation: Able to Wiggle Toes (02/02/19 1315), Patient able to flex knees: Yes (02/02/19 1315), Patient able to lift hips off bed: No (02/02/19 1315), Back pain beyond tenderness at insertion site: No (02/02/19 1315), Progressively worsening motor and/or sensory loss: No (02/02/19 1315), Bowel and/or bladder incontinence post epidural: No (02/02/19 1315)  Antoria Lanza A.

## 2019-02-02 NOTE — Progress Notes (Signed)
Notified bedside nurse of need to draw lactic acid @ 0822, check vitals more frequently and antibiotics asap.

## 2019-02-02 NOTE — Progress Notes (Signed)
Patient feeling much better.  AF and tachycardia resolving.   Continue Amp/Gent x 24 hours of afebrile status.  Linda Hedges, DO

## 2019-02-02 NOTE — Anesthesia Preprocedure Evaluation (Signed)
Anesthesia Evaluation   Patient awake    Reviewed: Allergy & Precautions, NPO status , Patient's Chart, lab work & pertinent test results  History of Anesthesia Complications (+) PONV and history of anesthetic complications  Airway Mallampati: II  TM Distance: >3 FB Neck ROM: Full    Dental no notable dental hx. (+) Teeth Intact   Pulmonary neg pulmonary ROS,    Pulmonary exam normal breath sounds clear to auscultation       Cardiovascular + angina Normal cardiovascular exam Rhythm:Regular Rate:Normal  PICC line in place   Neuro/Psych  Headaches, PSYCHIATRIC DISORDERS Anxiety Depression    GI/Hepatic GERD  Medicated and Poorly Controlled,Hyperemesis gravidarum    Endo/Other  Hypothyroidism Hx/o Hashimoto's thyroiditis  Renal/GU Renal diseaseHx/o renal calculi  negative genitourinary   Musculoskeletal negative musculoskeletal ROS (+)   Abdominal   Peds  Hematology  (+) anemia ,   Anesthesia Other Findings   Reproductive/Obstetrics (+) Pregnancy PPROM 28 4/7 weeks                             Anesthesia Physical Anesthesia Plan  ASA: II and emergent  Anesthesia Plan: Spinal   Post-op Pain Management:    Induction:   PONV Risk Score and Plan: 4 or greater and Scopolamine patch - Pre-op, Ondansetron, Dexamethasone and Treatment may vary due to age or medical condition  Airway Management Planned: Natural Airway  Additional Equipment:   Intra-op Plan:   Post-operative Plan:   Informed Consent: I have reviewed the patients History and Physical, chart, labs and discussed the procedure including the risks, benefits and alternatives for the proposed anesthesia with the patient or authorized representative who has indicated his/her understanding and acceptance.     Dental advisory given  Plan Discussed with: CRNA and Surgeon  Anesthesia Plan Comments:         Anesthesia  Quick Evaluation

## 2019-02-02 NOTE — Progress Notes (Signed)
I was informed in OR that no T&S was drawn this admission.  Verbal orders were given to MAU provider on admission for T&S when admission labs were drawn but did not get ordered in Epic.  Ordered when this oversight was noticed in OR.    Linda Hedges, DO

## 2019-02-02 NOTE — Addendum Note (Signed)
Addendum  created 02/02/19 1449 by Josephine Igo, MD   Intraprocedure Meds edited

## 2019-02-02 NOTE — Anesthesia Procedure Notes (Signed)
Spinal  Patient location during procedure: OR Start time: 02/02/2019 11:40 AM End time: 02/02/2019 11:44 AM Staffing Anesthesiologist: Josephine Igo, MD Performed: anesthesiologist  Preanesthetic Checklist Completed: patient identified, site marked, surgical consent, pre-op evaluation, timeout performed, IV checked, risks and benefits discussed and monitors and equipment checked Spinal Block Patient position: sitting Prep: site prepped and draped and DuraPrep Patient monitoring: heart rate, cardiac monitor, continuous pulse ox and blood pressure Approach: midline Location: L3-4 Injection technique: single-shot Needle Needle type: Pencan  Needle gauge: 24 G Needle length: 9 cm Needle insertion depth: 6 cm Assessment Sensory level: T4 Additional Notes Patient tolerated procedure well. Adequate sensory level.

## 2019-02-02 NOTE — Plan of Care (Signed)
Ampicillin IV started at 0756 per order, blood cultures drawn by lab at [phone] with other admission lab work.

## 2019-02-02 NOTE — Progress Notes (Signed)
Patient continues to report worsening pain with CTX.  Intermittent late deceleration and loss of variability despite fluid boluses and repositioning.  Given suspected chorio diagnosis and non-reassuring fetal monitoring with breech presentation, I recommend proceeding to C/S.  Patient is counseled re: risk of bleeding, infection, scarring, and damage to surrounding structures.  She understands 1% risk of uterine rupture in subsequent pregnancies.  All questions were answered and we will proceed.    Linda Hedges, DO

## 2019-02-02 NOTE — Consult Note (Signed)
Progress Note   Heather [name]  JJO:[id number]  DOB: [birth year]  DOA: 02/02/2019  PCP: Heather Aly, FNP   Requesting physician: Lynnette Caffey - OB/GYN  Reason for consultation: Sepsis - called for sepsis evaluation, but now patient is having LTCS for breech presentation with chorioamnionitis, which is likely the source of sepsis.  Consultation was canceled.   History of Present Illness: Heather [name] is an 33 y.o. female with h/o hypothyroidism presenting this AM with SROM at 28+4 ([id number]).   She had been seen in MAU yesterday due to [facility] and was given IVF and Procardia x 2.     Past Medical History: Past Medical History:  Diagnosis Date  . Anginal pain ([place])    cardiac workup normal  . Anxiety   . Chlamydia    in college  . Chronic kidney disease    hx kidney stone  . Depression   . GERD (gastroesophageal reflux disease)    with pregnancy  . Hashimoto's disease   . Headache    migraines  . History of ovarian cyst    ruptured  . Hx of varicella   . Hypothyroidism    borderline  . PONV (postoperative nausea and vomiting)     Past Surgical History: Past Surgical History:  Procedure Laterality Date  . DILATION AND EVACUATION N/A 08/27/2014   Procedure: DILATATION AND EVACUATION WITH CHROMOSOMES STUDES, ULTRASOUND;  Surgeon: Everlene Farrier, MD;  Location: [facility];  Service: Gynecology;  Laterality: N/A;  . DILATION AND EVACUATION N/A 01/30/2018   Procedure: DILATATION AND EVACUATION WITH GENETICS, ULTRASOUND GUIDANCE;  Surgeon: Everlene Farrier, MD;  Location: [facility];  Service: Gynecology;  Laterality: N/A;  GENETICS, ULTRASOUND GUIDANCE, 12 WEEK DEMISE  . LAPAROSCOPIC APPENDECTOMY N/A 01/13/2014   Procedure: APPENDECTOMY LAPAROSCOPIC;  Surgeon: Gwenyth Ober, MD;  Location: [place];  Service: General;  Laterality: N/A;  . TOOTH EXTRACTION       Allergies:   Allergies  Allergen Reactions  . Penicillins Other (See Comments)    Reaction:  Unknown  Has  patient had a PCN reaction causing immediate rash, facial/tongue/throat swelling, SOB or lightheadedness with hypotension: Unsure Has patient had a PCN reaction causing severe rash involving mucus membranes or skin necrosis: Unsure Has patient had a PCN reaction that required hospitalization Unsure Has patient had a PCN reaction occurring within the last 10 years: No If all of the above answers are "NO", then may proceed with Cephalosporin use.  . Chlorhexidine Gluconate Rash  . Monistat 1 [Tioconazole] Rash and Other (See Comments)    Yeast infection cream caused pain.     Social History:  reports that she has never smoked. She has never used smokeless tobacco. She reports previous alcohol use. She reports that she does not use drugs.   Family History: Family History  Problem Relation Age of Onset  . Hypertension Mother   . Diabetes Mother   . Cancer Maternal Grandmother   . Cancer Maternal Grandfather   . Rheum arthritis Maternal Grandfather       Physical Exam: Vitals:   02/02/19 1031 02/02/19 1100 02/02/19 1111 02/02/19 1116  BP: (!) 99/58 (!) 89/63    Pulse: (!) 111 (!) 120    Resp: 18 18    Temp:  98.3 F (36.8 C)    TempSrc:  Oral    SpO2:   100% 100%  Weight:        Data  reviewed:  I have personally reviewed the recent labs and imaging studies  Pertinent Labs:   Na++ 133 K+ 3.1 CO2 15 Glucose 126 Albumin 2.6 Bili 1.7 WBC 14.1 Hgb 10.6 Lactate 2.2 x 2 INR 1.1 Fern + 8/15 UA: trace LE, rare bacteria   Inpatient Medications:   Scheduled Meds: . [MAR Hold] betamethasone acetate-betamethasone sodium phosphate  12 mg Intramuscular Q24 Hr x 2  . [MAR Hold] famotidine  10 mg Oral Daily  . [MAR Hold] levothyroxine  187.5 mcg Oral Q0600  . [MAR Hold] oxytocin 40 units in LR 1000 mL  500 mL Intravenous Once  . [MAR Hold] pantoprazole  40 mg Oral Daily  . [MAR Hold] sodium chloride flush  10-40 mL Intracatheter Q12H   Continuous Infusions: . [MAR Hold]  lactated ringers    . lactated ringers 125 mL/hr at 02/02/19 0923  . [MAR Hold] magnesium sulfate 2 g/hr (02/02/19 0907)  . oxytocin       Radiological Exams on Admission: Dg Chest Port 1 View  Result Date: 02/02/2019 CLINICAL DATA:  Pregnant patient in labor. Fever. Possible sepsis. EXAM: PORTABLE CHEST 1 VIEW COMPARISON:  12/30/2018 FINDINGS: Left arm PICC line remains in stable position with tip overlying the distal left brachycephalic vein at the midline. Both lungs are well aerated and clear. Heart size is normal. IMPRESSION: No active disease. Electronically Signed   By: Marlaine Hind M.D.   On: 02/02/2019 10:15   Vas [place] Upper Extremity Venous Duplex  Result Date: 02/02/2019 UPPER VENOUS STUDY  Indications: neck pain radiating to the chest Other Indications: In active labor at 28 weeks. Comparison Study: Prior negative RUE venous duplex from 12/30/18 is available for                   comparison. Performing Technologist: Sharion Dove RVS  Examination Guidelines: A complete evaluation includes B-mode imaging, spectral Doppler, color Doppler, and power Doppler as needed of all accessible portions of each vessel. Bilateral testing is considered an integral part of a complete examination. Limited examinations for reoccurring indications may be performed as noted.  Right Findings: +----------+------------+---------+-----------+----------+---------------+ RIGHT     CompressiblePhasicitySpontaneousProperties    Summary     +----------+------------+---------+-----------+----------+---------------+ IJV           Full       Yes       Yes                              +----------+------------+---------+-----------+----------+---------------+ Subclavian               Yes       Yes                              +----------+------------+---------+-----------+----------+---------------+ Axillary      Full       Yes       Yes                               +----------+------------+---------+-----------+----------+---------------+ Brachial      Full       Yes       Yes                              +----------+------------+---------+-----------+----------+---------------+ Radial  Full                                                  +----------+------------+---------+-----------+----------+---------------+ Ulnar                                               patent by color +----------+------------+---------+-----------+----------+---------------+ Cephalic      Full                                                  +----------+------------+---------+-----------+----------+---------------+ Basilic       Full                                                  +----------+------------+---------+-----------+----------+---------------+  Left Findings: +----------+------------+---------+-----------+----------+-------+ LEFT      CompressiblePhasicitySpontaneousPropertiesSummary +----------+------------+---------+-----------+----------+-------+ Subclavian               Yes       Yes                      +----------+------------+---------+-----------+----------+-------+  *See table(s) above for measurements and observations.    Preliminary     Impression/Recommendations Active Problems:   Preterm premature rupture of membranes (PPROM) with unknown onset of labor   Sepsis ([place])   Acute chorioamnionitis  -Patient presented with PTC yesterday and PPROM overnight -Febrile with sepsis physiology today -Medicine consult was requested to evaluate for sepsis -After the call, I reviewed the patient's labs and studies prior to calling back Dr. Lynnette Caffey -When I called her back, she was unavailable and has since taken the patient to the OR for emergent LCTS due to breech presentation in the setting of sepsis due to chorioamnionitis -I was unable to evaluate the patient and the consultation has since been canceled. -I do  recommend blood/urine cultures and broad spectrum antibiotics with full IVF bolus due to sepsis with shock (hypotension) -I agree with the decision for emergent LTCS due to the need to obtain source control -Please reconsult TRH or PCCM if hemodynamic instability persists s/p LTCS   Thank you for this consultation. Please reconsult the [facility] team if additional assistance is needed.  Time Spent: 30 minutes  Karmen Bongo M.D. [facility] 02/02/2019, 12:20 PM

## 2019-02-02 NOTE — MAU Note (Signed)
FHR 180s. Pt difficult to monitor due to maternal anxiety and movement of u/s. Tracing maternal HR. U/s adjusted.

## 2019-02-02 NOTE — MAU Provider Note (Signed)
History     CSN: [id number]  Arrival date and time: 02/02/19 [id number]   First Provider Initiated Contact with Patient 02/02/19 [phone]      Chief Complaint  Patient presents with  . Rupture of Membranes   [name] is a 33 y.o. [id number] at 5581w4d who presents to MAU for LOF, ctx, fever, tachycardia. Provider was called the bedside by RN for the aforementioned symptoms. RN reports to provider that the patient is grossly ruptured and was able to collect a fern slide externally. Fern positive confirmed by provider prior to entering patient's room.  Of note, pt was seen in MAU yesterday afternoon 02/01/2019 for preterm contractions, and an fFN was performed, which was negative. Pt reports when she left MAU she was still having contractions, despite being given fluids and 2 doses of Procardia in MAU. Pt also reports while in MAU she spiked a temporary fever of 100. Pt reports she felt flushed, so they took her temperature and it was 100. Repeat temp, without intervention, was 98.6 and pt reports she felt normal.  Pt denies any exposure or any known source of infection. Pt reports husband works from home and has mild congestion at this time. Pt reports their 31yo does go to daycare, but pt denies any symptoms in her child. Pt denies any recent cuts, denies any areas of redness/pain on body, aside from contractions. Pt does have a PICC line for hyperemesis and ptyalism that she receives fluids through daily, but denies any s/sx of infection at that site.  Pt does report on-going neck pain on the right-side of her body that has been on-going for 5 weeks. Pt did inform her OB about this pain, and states that they believed it was musculoskeletal in nature.  No recorded weight in MAU, pt reports she takes her weight at home daily and it was 162lbs this morning.   OB History    Gravida  5   Para  1   Term  1   Preterm      AB  3   Living  1     SAB  3   TAB      Ectopic      Multiple   0   Live Births  1           Past Medical History:  Diagnosis Date  . Anginal pain ([facility])    cardiac workup normal  . Anxiety   . Chlamydia    in college  . Chronic kidney disease    hx kidney stone  . Depression   . GERD (gastroesophageal reflux disease)    with pregnancy  . Hashimoto's disease   . Headache    migraines  . History of ovarian cyst    ruptured  . Hx of varicella   . Hypothyroidism    borderline  . PONV (postoperative nausea and vomiting)     Past Surgical History:  Procedure Laterality Date  . DILATION AND EVACUATION N/A 08/27/2014   Procedure: DILATATION AND EVACUATION WITH CHROMOSOMES STUDES, ULTRASOUND;  Surgeon: Harold HedgeJames Tomblin, MD;  Location: [facility];  Service: Gynecology;  Laterality: N/A;  . DILATION AND EVACUATION N/A 01/30/2018   Procedure: DILATATION AND EVACUATION WITH GENETICS, ULTRASOUND GUIDANCE;  Surgeon: Harold Hedgeomblin, James, MD;  Location: [facility];  Service: Gynecology;  Laterality: N/A;  GENETICS, ULTRASOUND GUIDANCE, 12 WEEK DEMISE  . LAPAROSCOPIC APPENDECTOMY N/A 01/13/2014   Procedure: APPENDECTOMY LAPAROSCOPIC;  Surgeon: Cherylynn RidgesJames O Wyatt, MD;  Location: [facility];  Service: General;  Laterality: N/A;  . TOOTH EXTRACTION      Family History  Problem Relation Age of Onset  . Hypertension Mother   . Diabetes Mother   . Cancer Maternal Grandmother   . Cancer Maternal Grandfather   . Rheum arthritis Maternal Grandfather     Social History   Tobacco Use  . Smoking status: Never Smoker  . Smokeless tobacco: Never Used  Substance Use Topics  . Alcohol use: Not Currently  . Drug use: No    Allergies:  Allergies  Allergen Reactions  . Penicillins Other (See Comments)    Reaction:  Unknown  Has patient had a PCN reaction causing immediate rash, facial/tongue/throat swelling, SOB or lightheadedness with hypotension: Unsure Has patient had a PCN reaction causing severe rash involving mucus membranes or skin necrosis: Unsure Has patient had a  PCN reaction that required hospitalization Unsure Has patient had a PCN reaction occurring within the last 10 years: No If all of the above answers are "NO", then may proceed with Cephalosporin use.  . Chlorhexidine Gluconate Rash  . Monistat 1 [Tioconazole] Rash and Other (See Comments)    Yeast infection cream caused pain.    Medications Prior to Admission  Medication Sig Dispense Refill Last Dose  . acetaminophen (TYLENOL) 500 MG tablet Take 500 mg by mouth every 6 (six) hours as needed.   02/01/2019 at Unknown time  . BONJESTA 20-20 MG TBCR Take 1 tablet by mouth 2 (two) times a day.   02/01/2019 at Unknown time  . docusate sodium (COLACE) 100 MG capsule Take 100 mg by mouth 2 (two) times a day.   02/01/2019 at Unknown time  . famotidine (PEPCID) 10 MG tablet Take 10 mg by mouth daily.   02/01/2019 at Unknown time  . levothyroxine (SYNTHROID, LEVOTHROID) 25 MCG tablet Take 12.5 mcg by mouth daily before breakfast. Take with 1 tablet of 175 mcg to equal 187.5 mcg   02/01/2019 at Unknown time  . NIFEdipine (PROCARDIA XL) 30 MG 24 hr tablet Take 1 tablet (30 mg total) by mouth daily. 30 tablet 3 02/01/2019 at Unknown time  . pantoprazole (PROTONIX) 40 MG tablet Take 1 tablet by mouth daily.  12 02/01/2019 at Unknown time  . promethazine (PHENERGAN) 25 MG tablet Take 25 mg by mouth 6 (six) times daily.   Past Week at Unknown time  . SYNTHROID 175 MCG tablet Take 175 mcg by mouth daily. Take with 1/2 tablet of 25mcg to equal 187.5 mcg   02/01/2019 at Unknown time  . heparin flush 10 UNIT/ML SOLN injection Inject 5,000 Units into the vein 2 (two) times a day. Uses to keep Picc Line open     . HYDROcodone-acetaminophen (NORCO) 5-325 MG tablet Take 1-2 tablets by mouth every 6 (six) hours as needed. 15 tablet 0   . ondansetron (ZOFRAN-ODT) 8 MG disintegrating tablet Take 8 mg by mouth every 8 (eight) hours as needed for nausea.        Review of Systems  Constitutional: Positive for chills and fever.  Negative for diaphoresis and fatigue.  Respiratory: Positive for shortness of breath.   Cardiovascular: Positive for chest pain (pt reports mild chest pressure upon lying flat with associated difficulty breathing).  Gastrointestinal: Positive for abdominal pain, nausea and vomiting. Negative for constipation and diarrhea.  Genitourinary: Positive for pelvic pain, vaginal bleeding and vaginal discharge. Negative for dysuria, flank pain, frequency and urgency.  Musculoskeletal: Positive for back pain.  Neurological: Negative for dizziness, weakness,  light-headedness and headaches.   Physical Exam   Blood pressure 109/64, pulse (!) 142, temperature (!) 103 F (39.4 C), temperature source Oral, resp. rate (!) 24, weight 73.5 kg, SpO2 98 %, unknown if currently breastfeeding.  Patient Vitals for the past 24 hrs:  BP Temp Temp src Pulse Resp SpO2 Weight  02/02/19 0630 - - - (!) 142 - 98 % -  02/02/19 0615 - - - (!) 144 - 98 % -  02/02/19 0600 - - - (!) 162 - 98 % -  02/02/19 0555 - - - - - 98 % -  02/02/19 0550 - - - - - 99 % -  02/02/19 0545 - - - (!) 160 - 100 % -  02/02/19 0525 109/64 (!) 103 F (39.4 C) Oral (!) 157 (!) 24 98 % 73.5 kg   Physical Exam  Constitutional: She is oriented to person, place, and time. She appears well-developed and well-nourished. She appears distressed.  HENT:  Head: Normocephalic and atraumatic.  Respiratory: Effort normal.  GI: Soft. She exhibits no distension and no mass. There is no abdominal tenderness. There is no rebound and no guarding.  Genitourinary: There is no rash, tenderness or lesion on the right labia. There is no rash, tenderness or lesion on the left labia.    Genitourinary Comments: CE: 1.5/midline/long - bloody show   Musculoskeletal:     Comments: Right arm red and significantly warmer to touch than left. Right chest/breast examined, no areas of redness noted, but warmth continues to be elevated compared with left side.  Neurological:  She is alert and oriented to person, place, and time.  Skin: Skin is warm and dry. She is not diaphoretic.  Psychiatric: Her behavior is normal. Judgment and thought content normal. Her mood appears anxious.   Results for orders placed or performed during the hospital encounter of 02/02/19 (from the past 24 hour(s))  Fern Test     Status: None   Collection Time: 02/02/19  5:44 AM  Result Value Ref Range   POCT Fern Test Positive = ruptured amniotic membanes    MAU Course  Procedures  MDM -called and spoke with Dr. Langston MaskerMorris about 0530 to recommend admission. Dr. Langston MaskerMorris agrees, admit to L&D. Dr. Langston MaskerMorris requests that Sepsis protocol be entered along with betamethasone. Dr. Langston MaskerMorris also requests Vascular US of RUE to assess for DVT as a cause for fever of unknown origin. Dr. Langston MaskerMorris to enter orders for L&D admission and antibiotics/antipyretics. -EFM: reactive with tachycardia       -baseline: 175       -variability: moderate       -accels: present, 10x10       -decels: present, few variable       -TOCO: ctx q1-1.85min -CE: 1.5/midline/long -admit to L&D for delivery  Orders Placed This Encounter  Procedures  . Culture, blood (x 2)    INITIATE ANTIBIOTICS WITHIN 1 HOUR AFTER BLOOD CULTURES DRAWN.  If unable to obtain blood cultures, call MD immediately regarding antibiotic instructions.    Standing Status:   Standing    Number of Occurrences:   2  . CBC with Differential    Standing Status:   Standing    Number of Occurrences:   1  . Comprehensive metabolic panel    Standing Status:   Standing    Number of Occurrences:   1  . Lactic acid, plasma    Standing Status:   Standing    Number of Occurrences:  2  . Protime-INR    Standing Status:   Standing    Number of Occurrences:   1  . APTT    Standing Status:   Standing    Number of Occurrences:   1  . If lactate (lactic acid) >2, verify repeat lactic acid order has been placed to be drawn    Standing Status:   Standing    Number  of Occurrences:   1  . Document vital signs within 1-hour of fluid bolus completion and notify provider of bolus completion    Standing Status:   Standing    Number of Occurrences:   1  . Vital signs    Standing Status:   Standing    Number of Occurrences:   1  . Vital signs    Standing Status:   Standing    Number of Occurrences:   1  . RN to call RRT (rapid response team)    Standing Status:   Standing    Number of Occurrences:   1  . Refer to Sidebar Report: Sepsis Sidebar    Sepsis Sidebar    Standing Status:   Standing    Number of Occurrences:   1  . Apply neutral pressure cap    Standing Status:   Standing    Number of Occurrences:   20  . May draw labs from central line    Standing Status:   Standing    Number of Occurrences:   20  . For first catheter occlusion notify IV team ([facility], [facility]) or provider at all other sites.    Standing Status:   Standing    Number of Occurrences:   1  . Pharmacy Consult    Standing Status:   Standing    Number of Occurrences:   1    Order Specific Question:   Reason for Consult (*indicate specifics in comment field)    Answer:   Other (see comment)    Order Specific Question:   Comment:    Answer:   Treatment for SUSPECTED SEPSIS has been initiated. Follow patient to expedite delivery of core sepsis measures, specifically timely, antibiotic administration.  . Initiate Code Sepsis ([facility] [phone]) Consult Timeframe: STAT - requires a response within one hour; STAT timeframe requires provider to provider communication, has the provider to provider communication been completed: Yes; Reason for Cons...    Standing Status:   Standing    Number of Occurrences:   1    Order Specific Question:   Initiate "Code Sepsis" tracking    Answer:   yes    Order Specific Question:   Contact PCCM ([phone])    Answer:   Yes    Order Specific Question:   Consult Timeframe    Answer:   STAT - requires a response within one hour    Order Specific  Question:   STAT timeframe requires provider to provider communication, has the provider to provider communication been completed    Answer:   Yes    Order Specific Question:   Reason for Consult?    Answer:   fever of 103  . Fern Test    Standing Status:   Standing    Number of Occurrences:   1  . Routine line care    Standing Status:   Standing    Number of Occurrences:   17   Assessment and Plan   1. Fever of unknown origin   2. Preterm contractions   3.  Preterm premature rupture of membranes, unspecified duration to onset of labor   4. Tachycardia   5. Antepartum fetal tachycardia affecting care of mother   6. [redacted] weeks gestation of pregnancy    -admit to L&D for delivery  Heather [name] 02/02/2019, 6:43 AM

## 2019-02-02 NOTE — Progress Notes (Signed)
VASCULAR LAB PRELIMINARY  PRELIMINARY  PRELIMINARY  PRELIMINARY  Right upper extremity venous duplex completed.    Preliminary report: See CV proc for preliminary results.   Konstance Happel, RVT 02/02/2019, 8:14 AM

## 2019-02-03 ENCOUNTER — Inpatient Hospital Stay (HOSPITAL_COMMUNITY): Payer: 59

## 2019-02-03 DIAGNOSIS — B9561 Methicillin susceptible Staphylococcus aureus infection as the cause of diseases classified elsewhere: Secondary | ICD-10-CM

## 2019-02-03 DIAGNOSIS — I361 Nonrheumatic tricuspid (valve) insufficiency: Secondary | ICD-10-CM

## 2019-02-03 DIAGNOSIS — L258 Unspecified contact dermatitis due to other agents: Secondary | ICD-10-CM

## 2019-02-03 DIAGNOSIS — O98813 Other maternal infectious and parasitic diseases complicating pregnancy, third trimester: Secondary | ICD-10-CM

## 2019-02-03 DIAGNOSIS — Z880 Allergy status to penicillin: Secondary | ICD-10-CM

## 2019-02-03 DIAGNOSIS — R7881 Bacteremia: Secondary | ICD-10-CM

## 2019-02-03 DIAGNOSIS — Z888 Allergy status to other drugs, medicaments and biological substances status: Secondary | ICD-10-CM

## 2019-02-03 LAB — CBC
HCT: 24.1 % — ABNORMAL LOW (ref 36.0–46.0)
Hemoglobin: 8.3 g/dL — ABNORMAL LOW (ref 12.0–15.0)
MCH: 30.4 pg (ref 26.0–34.0)
MCHC: 34.4 g/dL (ref 30.0–36.0)
MCV: 88.3 fL (ref 80.0–100.0)
Platelets: 204 K/uL (ref 150–400)
RBC: 2.73 MIL/uL — ABNORMAL LOW (ref 3.87–5.11)
RDW: 13.7 % (ref 11.5–15.5)
WBC: 25.2 K/uL — ABNORMAL HIGH (ref 4.0–10.5)
nRBC: 0 % (ref 0.0–0.2)

## 2019-02-03 LAB — ECHOCARDIOGRAM COMPLETE
Height: 69 in
Weight: 2592.01 oz

## 2019-02-03 MED ORDER — CEFAZOLIN SODIUM-DEXTROSE 2-4 GM/100ML-% IV SOLN
2.0000 g | Freq: Three times a day (TID) | INTRAVENOUS | Status: DC
  Administered 2019-02-03 – 2019-02-07 (×15): 2 g via INTRAVENOUS
  Filled 2019-02-03 (×18): qty 100

## 2019-02-03 MED ORDER — IBUPROFEN 600 MG PO TABS
600.0000 mg | ORAL_TABLET | Freq: Four times a day (QID) | ORAL | Status: DC | PRN
  Administered 2019-02-03 – 2019-02-07 (×11): 600 mg via ORAL
  Filled 2019-02-03 (×14): qty 1

## 2019-02-03 NOTE — Accreditation Note (Signed)
[facility]  Total days of antibiotics 2 cefazolin        Reason for Consult: staph aureus bacteremia    Referring Physician:   Active Problems:   Preterm premature rupture of membranes (PPROM) with unknown onset of labor   Sepsis (HCC)   Acute chorioamnionitis   Preterm premature rupture of membranes (PPROM) delivered, current hospitalization    HPI: Heather [name] is a 33 y.o. female who is G5P1031 at 3156w4d with pregnancy complicated by hyperemesis necessitating a PICC line at 7w GA who presented with PPROM in addition to having fever of 103F with labs that showed WBC of 14K with 93% N.  patient denied having left arm pain, swelling or cough, chest pain or shortness of breath. She does have contact dermatitis to the tape used for dressing picc line. She reports cleaning her picc line according to instructions. She underwent sepsis work up that revealed MSSA bacteremia. CXR per my read does not show any evidence of infiltrates. She was initially started on amp/gent then narrowed to cefazolin. Due to concern for chorioamniontis and non-reassuring fetal tracings, she underwent c-section yesterday for which she tolerated well. She is sore from csection. Remains afebrile today.   Past Medical History:  Diagnosis Date  . Anginal pain (HCC)    cardiac workup normal  . Anxiety   . Chlamydia    in college  . Chronic kidney disease    hx kidney stone  . Depression   . GERD (gastroesophageal reflux disease)    with pregnancy  . Hashimoto's disease   . Headache    migraines  . History of ovarian cyst    ruptured  . Hx of varicella   . Hypothyroidism    borderline  . PONV (postoperative nausea and vomiting)     Allergies:  Allergies  Allergen Reactions  . Penicillins Other (See Comments)    Reaction:  Unknown  Has patient had a PCN reaction causing immediate rash, facial/tongue/throat swelling, SOB or lightheadedness with hypotension: Unsure Has  patient had a PCN reaction causing severe rash involving mucus membranes or skin necrosis: Unsure Has patient had a PCN reaction that required hospitalization Unsure Has patient had a PCN reaction occurring within the last 10 years: No If all of the above answers are "NO", then may proceed with Cephalosporin use.  . Chlorhexidine Gluconate Rash  . Monistat 1 [Tioconazole] Rash and Other (See Comments)    Yeast infection cream caused pain.     MEDICATIONS: . levothyroxine  12.5 mcg Oral QAC breakfast  . levothyroxine  175 mcg Oral Daily  . pantoprazole  40 mg Oral Daily  . prenatal multivitamin  1 tablet Oral Q1200  . senna-docusate  2 tablet Oral Q24H  . simethicone  80 mg Oral TID PC  . simethicone  80 mg Oral Q24H  . Tdap  0.5 mL Intramuscular Once    Social History   Tobacco Use  . Smoking status: Never Smoker  . Smokeless tobacco: Never Used  Substance Use Topics  . Alcohol use: Not Currently  . Drug use: No    Family History  Problem Relation Age of Onset  . Hypertension Mother   . Diabetes Mother   . Cancer Maternal Grandmother   . Cancer Maternal Grandfather   . Rheum arthritis Maternal Grandfather    Review of Systems  Constitutional: positive for fever, chills, diaphoresis, activity change, appetite change, fatigue and unexpected weight change.  HENT: Negative for congestion,  sore throat, rhinorrhea, sneezing, trouble swallowing and sinus pressure.  Eyes: Negative for photophobia and visual disturbance.  Respiratory: Negative for cough, chest tightness, shortness of breath, wheezing and stridor.  Cardiovascular: Negative for chest pain, palpitations and leg swelling.  Gastrointestinal: positive for abdominal pain associated with pregnancy. Negative for nausea, vomiting, abdominal pain, diarrhea, constipation, blood in stool, abdominal distention and anal bleeding.  Genitourinary: Negative for dysuria, hematuria, flank pain and difficulty urinating.   Musculoskeletal: Negative for myalgias, back pain, joint swelling, arthralgias and gait problem.  Skin: Negative for color change, pallor, rash and wound.  Neurological: Negative for dizziness, tremors, weakness and light-headedness.  Hematological: Negative for adenopathy. Does not bruise/bleed easily.  Psychiatric/Behavioral: Negative for behavioral problems, confusion, sleep disturbance, dysphoric mood, decreased concentration and agitation.     OBJECTIVE: Temp:  [97.9 F (36.6 C)-98.4 F (36.9 C)] 98.4 F (36.9 C) (08/17 1304) Pulse Rate:  [88-104] 97 (08/17 1304) Resp:  [16-18] 18 (08/17 1304) BP: (87-98)/(54-66) 91/64 (08/17 1304) SpO2:  [96 %-99 %] 99 % (08/17 1304) Physical Exam  Constitutional:  oriented to person, place, and time. appears well-developed and well-nourished. No distress.  HENT: [place]/AT, PERRLA, no scleral icterus Mouth/Throat: Oropharynx is clear and moist. No oropharyngeal exudate.  Cardiovascular: Normal rate, regular rhythm and normal heart sounds. Exam reveals no gallop and no friction rub.  No murmur heard.  Pulmonary/Chest: Effort normal and breath sounds normal. No respiratory distress.  has no wheezes.  Neck = supple, no nuchal rigidity Abdominal: Soft. Bowel sounds are normal.  exhibits no distension. There is no tenderness.  Lymphadenopathy: no cervical adenopathy. No axillary adenopathy Ext: left upper arm signs of contact dermatitis Neurological: alert and oriented to person, place, and time.  Skin: Skin is warm and dry. No rash noted. No erythema.  Psychiatric: a normal mood and affect.  behavior is normal.    LABS: Results for orders placed or performed during the hospital encounter of 02/02/19 (from the past 48 hour(s))  Fern Test     Status: None   Collection Time: 02/02/19  5:44 AM  Result Value Ref Range   POCT Fern Test Positive = ruptured amniotic membanes   CBC with Differential     Status: Abnormal   Collection Time: 02/02/19  6:39  AM  Result Value Ref Range   WBC 14.1 (H) 4.0 - 10.5 K/uL   RBC 3.54 (L) 3.87 - 5.11 MIL/uL   Hemoglobin 10.6 (L) 12.0 - 15.0 g/dL   HCT 16.130.6 (L) 09.636.0 - 04.546.0 %   MCV 86.4 80.0 - 100.0 fL   MCH 29.9 26.0 - 34.0 pg   MCHC 34.6 30.0 - 36.0 g/dL   RDW 40.913.5 81.111.5 - 91.415.5 %   Platelets 193 150 - 400 K/uL   nRBC 0.0 0.0 - 0.2 %   Neutrophils Relative % 93 %   Neutro Abs 13.1 (H) 1.7 - 7.7 K/uL   Lymphocytes Relative 4 %   Lymphs Abs 0.6 (L) 0.7 - 4.0 K/uL   Monocytes Relative 2 %   Monocytes Absolute 0.2 0.1 - 1.0 K/uL   Eosinophils Relative 0 %   Eosinophils Absolute 0.0 0.0 - 0.5 K/uL   Basophils Relative 0 %   Basophils Absolute 0.0 0.0 - 0.1 K/uL   Immature Granulocytes 1 %   Abs Immature Granulocytes 0.14 (H) 0.00 - 0.07 K/uL    Comment: Performed at [facility], [address] [place], [place] [address]  Comprehensive metabolic panel  Status: Abnormal   Collection Time: 02/02/19  6:39 AM  Result Value Ref Range   Sodium 133 (L) 135 - 145 mmol/L   Potassium 3.1 (L) 3.5 - 5.1 mmol/L   Chloride 103 98 - 111 mmol/L   CO2 15 (L) 22 - 32 mmol/L   Glucose, Bld 126 (H) 70 - 99 mg/dL   BUN 5 (L) 6 - 20 mg/dL   Creatinine, Ser 1.02 0.44 - 1.00 mg/dL   Calcium 8.9 8.9 - 72.5 mg/dL   Total Protein 5.9 (L) 6.5 - 8.1 g/dL   Albumin 2.6 (L) 3.5 - 5.0 g/dL   AST 30 15 - 41 U/L   ALT 15 0 - 44 U/L   Alkaline Phosphatase 115 38 - 126 U/L   Total Bilirubin 1.7 (H) 0.3 - 1.2 mg/dL   GFR calc non Af Amer >60 >60 mL/min   GFR calc Af Amer >60 >60 mL/min   Anion gap 15 5 - 15    Comment: Performed at [facility], [address] [place], [place] [address]  Lactic acid, plasma     Status: Abnormal   Collection Time: 02/02/19  6:39 AM  Result Value Ref Range   Lactic Acid, Venous 2.2 (HH) 0.5 - 1.9 mmol/L    Comment: CRITICAL RESULT CALLED TO, READ BACK BY AND VERIFIED WITHMariane Duval [facility] RN AT 0347 02/02/2019 BY Miquel Dunn Performed at [facility], [address], [place], [place] [address]   Protime-INR     Status: None   Collection Time: 02/02/19  6:39 AM  Result Value Ref Range   Prothrombin Time 14.4 11.4 - 15.2 seconds   INR 1.1 0.8 - 1.2    Comment: (NOTE) INR goal varies based on device and disease states. Performed at [facility], [address], [place], [place] [address]   APTT     Status: None   Collection Time: 02/02/19  6:39 AM  Result Value Ref Range   aPTT 32 24 - 36 seconds    Comment: Performed at [facility], [address], [place], [place] [address]  Culture, blood (x 2)     Status: Abnormal (Preliminary result)   Collection Time: 02/02/19  8:01 AM   Specimen: BLOOD  Result Value Ref Range   Specimen Description BLOOD    Special Requests      BOTTLES DRAWN AEROBIC AND ANAEROBIC Blood Culture results may not be optimal due to an inadequate volume of blood received in culture bottles   Culture  Setup Time      GRAM POSITIVE COCCI IN CLUSTERS IN BOTH AEROBIC AND ANAEROBIC BOTTLES CRITICAL RESULT CALLED TO, READ BACK BY AND VERIFIED WITH: T. MIDKISF,PHARMD 2336 02/02/2019 Girtha Hake Performed at [facility], [address], [place], [place] [address]    Culture STAPHYLOCOCCUS AUREUS (A)    Report Status PENDING   Culture, blood (x 2)     Status: Abnormal (Preliminary result)   Collection Time: 02/02/19  8:02 AM   Specimen: BLOOD  Result Value Ref Range   Specimen Description BLOOD    Special Requests      BOTTLES DRAWN AEROBIC AND ANAEROBIC Blood Culture results may not be optimal due to an inadequate volume of blood received in culture bottles   Culture  Setup Time      GRAM POSITIVE COCCI IN CLUSTERS IN BOTH AEROBIC AND ANAEROBIC BOTTLES CRITICAL RESULT CALLED TO, READ BACK BY AND VERIFIED WITH: T.  MIDKISF,PHARMD 2336 02/02/2019 T. TYSOR    Culture (A)     STAPHYLOCOCCUS AUREUS SUSCEPTIBILITIES TO FOLLOW Performed at [facility], [address], [place], [place] [address]    Report  Status PENDING   Blood Culture ID Panel (Reflexed)     Status: Abnormal   Collection Time: 02/02/19  8:02 AM  Result Value Ref Range   Enterococcus species NOT DETECTED NOT DETECTED   Listeria monocytogenes NOT DETECTED NOT DETECTED   Staphylococcus species DETECTED (A) NOT DETECTED    Comment: CRITICAL RESULT CALLED TO, READ BACK BY AND VERIFIED WITH: T. MIDKISF,PHARMD 2336 02/02/2019 T. TYSOR    Staphylococcus aureus (BCID) DETECTED (A) NOT DETECTED    Comment: Methicillin (oxacillin) susceptible Staphylococcus aureus (MSSA). Preferred therapy is anti staphylococcal beta lactam antibiotic (Cefazolin or Nafcillin), unless clinically contraindicated. CRITICAL RESULT CALLED TO, READ BACK BY AND VERIFIED WITH: T. MIDKISF,PHARMD 2336 02/02/2019 T. TYSOR    Methicillin resistance NOT DETECTED NOT DETECTED   Streptococcus species NOT DETECTED NOT DETECTED   Streptococcus agalactiae NOT DETECTED NOT DETECTED   Streptococcus pneumoniae NOT DETECTED NOT DETECTED   Streptococcus pyogenes NOT DETECTED NOT DETECTED   Acinetobacter baumannii NOT DETECTED NOT DETECTED   Enterobacteriaceae species NOT DETECTED NOT DETECTED   Enterobacter cloacae complex NOT DETECTED NOT DETECTED   Escherichia coli NOT DETECTED NOT DETECTED   Klebsiella oxytoca NOT DETECTED NOT DETECTED   Klebsiella pneumoniae NOT DETECTED NOT DETECTED   Proteus species NOT DETECTED NOT DETECTED   Serratia marcescens NOT DETECTED NOT DETECTED   Haemophilus influenzae NOT DETECTED NOT DETECTED   Neisseria meningitidis NOT DETECTED NOT DETECTED   Pseudomonas aeruginosa NOT DETECTED NOT DETECTED   Candida albicans NOT DETECTED NOT DETECTED   Candida glabrata NOT DETECTED NOT DETECTED   Candida krusei NOT DETECTED NOT DETECTED   Candida parapsilosis NOT DETECTED NOT DETECTED   Candida tropicalis NOT DETECTED NOT DETECTED    Comment: Performed at [facility], [address], [place], [place] [address]  Lactic acid, plasma      Status: Abnormal   Collection Time: 02/02/19  8:33 AM  Result Value Ref Range   Lactic Acid, Venous 2.2 (HH) 0.5 - 1.9 mmol/L    Comment: CRITICAL RESULT CALLED TO, READ BACK BY AND VERIFIED WITHMariane Duval [facility] RN AT 0981 02/02/2019 BY Miquel Dunn Performed at [facility], [address], [place], [place] [address]   Group B strep by PCR     Status: None   Collection Time: 02/02/19  9:51 AM   Specimen: Vaginal/Rectal; Genital  Result Value Ref Range   Group B strep by PCR NEGATIVE NEGATIVE    Comment: (NOTE) Intrapartum testing with Xpert GBS assay should be used as an adjunct to other methods available and not used to replace antepartum testing (at 35-[redacted] weeks gestation). Performed at [facility], [address], [place], [place] [address]   Lactate dehydrogenase     Status: None   Collection Time: 02/02/19  1:17 PM  Result Value Ref Range   LDH 124 98 - 192 U/L    Comment: Performed at [facility], [address], [place], [place] [address]  Type and screen [facility]     Status: None   Collection Time: 02/02/19  1:17 PM  Result Value Ref Range   ABO/RH(D) B POS    Antibody Screen NEG    Sample Expiration      02/05/2019,2359 Performed at  [facility], [address] [place], [place] [address]   ABO/Rh     Status: None   Collection Time: 02/02/19  1:17 PM  Result Value Ref Range   ABO/RH(D)      B POS Performed at [facility], [address] [place], [place] [address]   Lactic acid, plasma     Status: Abnormal   Collection Time: 02/02/19  6:29 PM  Result Value Ref Range   Lactic Acid, Venous 2.1 (HH) 0.5 - 1.9 mmol/L    Comment: CRITICAL RESULT CALLED TO, READ BACK BY AND VERIFIED WITH: CERVANTES, E RN @ 1910 ON 02/02/2019 BY TEMOCHE, H Performed at [facility], [address] [place], [place] [address]   CBC     Status: Abnormal   Collection Time: 02/03/19  6:44 AM  Result Value Ref Range   WBC 25.2 (H)  4.0 - 10.5 K/uL   RBC 2.73 (L) 3.87 - 5.11 MIL/uL   Hemoglobin 8.3 (L) 12.0 - 15.0 g/dL    Comment: REPEATED TO VERIFY   HCT 24.1 (L) 36.0 - 46.0 %   MCV 88.3 80.0 - 100.0 fL   MCH 30.4 26.0 - 34.0 pg   MCHC 34.4 30.0 - 36.0 g/dL   RDW 65.713.7 84.611.5 - 96.215.5 %   Platelets 204 150 - 400 K/uL   nRBC 0.0 0.0 - 0.2 %    Comment: Performed at [facility], [address] [place], [place] [address]    MICRO: 8/17 blood cx pending 8/16 blood cx MSSA IMAGING: Dg Chest Port 1 View  Result Date: 02/02/2019 CLINICAL DATA:  Pregnant patient in labor. Fever. Possible sepsis. EXAM: PORTABLE CHEST 1 VIEW COMPARISON:  12/30/2018 FINDINGS: Left arm PICC line remains in stable position with tip overlying the distal left brachycephalic vein at the midline. Both lungs are well aerated and clear. Heart size is normal. IMPRESSION: No active disease. Electronically Signed   By: Danae OrleansJohn A Stahl M.D.   On: 02/02/2019 10:15   Vas [place] Upper Extremity Venous Duplex  Result Date: 02/03/2019 UPPER VENOUS STUDY  Indications: neck pain radiating to the chest Other Indications: In active labor at 28 weeks. Comparison Study: Prior negative RUE venous duplex from 12/30/18 is available for                   comparison. Performing Technologist: Sherren Kernsandace Kanady RVS  Examination Guidelines: A complete evaluation includes B-mode imaging, spectral Doppler, color Doppler, and power Doppler as needed of all accessible portions of each vessel. Bilateral testing is considered an integral part of a complete examination. Limited examinations for reoccurring indications may be performed as noted.  Right Findings: +----------+------------+---------+-----------+----------+---------------+ RIGHT     CompressiblePhasicitySpontaneousProperties    Summary     +----------+------------+---------+-----------+----------+---------------+ IJV           Full       Yes       Yes                               +----------+------------+---------+-----------+----------+---------------+ Subclavian               Yes       Yes                              +----------+------------+---------+-----------+----------+---------------+ Axillary      Full  Yes       Yes                              +----------+------------+---------+-----------+----------+---------------+ Brachial      Full       Yes       Yes                              +----------+------------+---------+-----------+----------+---------------+ Radial        Full                                                  +----------+------------+---------+-----------+----------+---------------+ Ulnar                                               patent by color +----------+------------+---------+-----------+----------+---------------+ Cephalic      Full                                                  +----------+------------+---------+-----------+----------+---------------+ Basilic       Full                                                  +----------+------------+---------+-----------+----------+---------------+  Left Findings: +----------+------------+---------+-----------+----------+-------+ LEFT      CompressiblePhasicitySpontaneousPropertiesSummary +----------+------------+---------+-----------+----------+-------+ Subclavian               Yes       Yes                      +----------+------------+---------+-----------+----------+-------+  *See table(s) above for measurements and observations.  Diagnosing physician: Servando Snare MD Electronically signed by Servando Snare MD on 02/03/2019 at 8:08:57 AM.    Final     Assessment/Plan:  33yo F with MSSA bacteremia in the setting of having picc line placed roughly 21 weeks ago for hyperemesis gravida. Denies any recent tenderness or erythema to picc line site other than contact dermatitis.  - continue on cefazolin 2gm IV Q 8hr - await TTE results. - may  still need TEE for rule out endocarditis - will follow up on today's blood cx to ensure she is clearing bacteremia

## 2019-02-03 NOTE — Progress Notes (Signed)
  Echocardiogram 2D Echocardiogram has been performed.  Darlina Sicilian M 02/03/2019, 9:08 AM

## 2019-02-03 NOTE — Lactation Note (Signed)
This note was copied from a baby's chart. Lactation Consultation Note  Patient Name: Heather [name] HYIFO'Y Date: 02/03/2019 Reason for consult: Initial assessment;NICU baby;Preterm <34wks;Infant < 6lbs;Maternal endocrine disorder Type of Endocrine Disorder?: Thyroid(hypothyroid)  Visited with mom of a 38 hours old pre-term < 3 lbs NICU female who is currently on NPO but mom is already pumping and plans to provide her EBM (when available) and donor milk in the mean time. She's a P2 and experienced BF, she BF her first one for 3 months, the last month was exclusively pumping and bottle feeding. She had several issues with her first baby; she has hypothyroidism and she kept going to see her endocrinologist every 4 weeks to adjust the dose of her thyroid meds, at some point she even had hyperthyroidism. She has a Medela DEBP at home and plans to order a Spectra one though her insurance.  Mom has already started pumping but has seen no colostrum. She told LC that her undersupply with her first one was so severe that by the time her baby was 53 months old, she was power pumping, has seen a LC and done every trick in the book and she could only get 1 ounce combined per pumping session. Explained to mom that each pregnancy is different and that our expectation during the first few days is not really to get volume but mainly to stimulate the breast. LC provided empathy to mom and praised her for her efforts.  When reviewing hand expression with mom, no colostrum was available either; however she reported (+) breast changes during the pregnancy, her nipples are small and everted and her tissue is compressible. Reviewed pumping log, donor milk, benefits of premature milk and breastmilk storage guidelines for NICU babies.  Feeding plan:  1. Encouraged mom to pump every 3 hours, at least 8 pumping sessions in 24 hours 2. Mom will do lots of breast massage and practice on hand expression prior pumping. 3.  Once she starts getting drops, she'll label the bullets and take them to her baby in the NICU.  BF brochure, BF resources and NICU booklet were reviewed. Parents reported all questions and concerns were answered, they're both aware of [facility] services and will call PRN.  Maternal Data Formula Feeding for Exclusion: Yes Reason for exclusion: Mother's choice to formula and breast feed on admission Has patient been taught Hand Expression?: Yes Does the patient have breastfeeding experience prior to this delivery?: Yes  Feeding    Interventions Interventions: Breast feeding basics reviewed;Hand express;Breast massage;Breast compression;DEBP  Lactation Tools Discussed/Used Tools: Pump Breast pump type: Double-Electric Breast Pump WIC Program: No Pump Review: Setup, frequency, and cleaning;Milk Storage Initiated by:: RN Date initiated:: 02/02/19   Consult Status Consult Status: PRN Follow-up type: In-patient    [place] [name] 02/03/2019, 8:18 PM

## 2019-02-03 NOTE — Progress Notes (Signed)
PHARMACY - PHYSICIAN COMMUNICATION CRITICAL VALUE ALERT - BLOOD CULTURE IDENTIFICATION (BCID)  Heather [name] is an 33 y.o. female who presented to [facility] on [facility]/02/2019 with a chief complaint of SROM at [redacted]w[redacted]d.  Assessment:  Per Dr. Lynnette Caffey, pt has a PICC line and Staph aureus is likely from the line.  (include suspected source if known)  Name of physician (or Provider) Contacted: Linda Hedges, MD  Current antibiotics: received azithromycin 1000mg  8/16 @ 1000, additional 1000mg  ordered for 8/17 @0000  Received ampicillin 2 grams Q6 hours prior to delivery 1 dose, received 2 doses after delivery Received 2 grams of cefazolin for surgical prophylaxis Gentamicin 5mg /kg Q24 last dose at 1559 8/16  Changes to prescribed antibiotics recommended:   Cefazolin 2 grams Q8 Recommendations accepted by provider  Results for orders placed or performed during the hospital encounter of 02/02/19  Blood Culture ID Panel (Reflexed) (Collected: 02/02/2019  8:02 AM)  Result Value Ref Range   Enterococcus species NOT DETECTED NOT DETECTED   Listeria monocytogenes NOT DETECTED NOT DETECTED   Staphylococcus species DETECTED (A) NOT DETECTED   Staphylococcus aureus (BCID) DETECTED (A) NOT DETECTED   Methicillin resistance NOT DETECTED NOT DETECTED   Streptococcus species NOT DETECTED NOT DETECTED   Streptococcus agalactiae NOT DETECTED NOT DETECTED   Streptococcus pneumoniae NOT DETECTED NOT DETECTED   Streptococcus pyogenes NOT DETECTED NOT DETECTED   Acinetobacter baumannii NOT DETECTED NOT DETECTED   Enterobacteriaceae species NOT DETECTED NOT DETECTED   Enterobacter cloacae complex NOT DETECTED NOT DETECTED   Escherichia coli NOT DETECTED NOT DETECTED   Klebsiella oxytoca NOT DETECTED NOT DETECTED   Klebsiella pneumoniae NOT DETECTED NOT DETECTED   Proteus species NOT DETECTED NOT DETECTED   Serratia marcescens NOT DETECTED NOT DETECTED   Haemophilus influenzae NOT DETECTED NOT DETECTED   Neisseria  meningitidis NOT DETECTED NOT DETECTED   Pseudomonas aeruginosa NOT DETECTED NOT DETECTED   Candida albicans NOT DETECTED NOT DETECTED   Candida glabrata NOT DETECTED NOT DETECTED   Candida krusei NOT DETECTED NOT DETECTED   Candida parapsilosis NOT DETECTED NOT DETECTED   Candida tropicalis NOT DETECTED NOT DETECTED    Kooper Godshall Scarlett 02/03/2019  12:01 AM

## 2019-02-03 NOTE — Progress Notes (Signed)
Subjective: Postpartum Day 1: Cesarean Delivery Patient reports nausea, incisional pain, tolerating PO and no problems voiding.  Neck and shoulder pain resolved.  Nausea improving.  PICC line removed this morning.  No CP/SOB.  Incisional pain controlled with PO meds.   Baby girl "Matilda" in NICU.  Objective: Vital signs in last 24 hours: Temp:  [97.3 F (36.3 C)-98.5 F (36.9 C)] 98.3 F (36.8 C) (08/17 0757) Pulse Rate:  [88-124] 88 (08/17 0757) Resp:  [16-30] 18 (08/17 0757) BP: (77-105)/(38-85) 89/59 (08/17 0757) SpO2:  [96 %-100 %] 99 % (08/17 0757)  Physical Exam:  General: alert Lochia: appropriate Uterine Fundus: firm Incision: healing well, no significant drainage DVT Evaluation: No evidence of DVT seen on physical exam.  Recent Labs    02/02/19 0639 02/03/19 0644  HGB 10.6* 8.3*  HCT 30.6* 24.1*    Assessment/Plan: Status post Cesarean section. Doing well postoperatively.  Staph bacteremia - s/p Azithromycin x 1, continue IV ancef ECHO today   Marylynn Pearson 02/03/2019, 8:32 AM

## 2019-02-03 NOTE — Lactation Note (Signed)
This note was copied from a baby's chart. Lactation Consultation Note  Patient Name: Heather [name] Date: 02/03/2019   Attempted to visit with mom but she's asleep. Spoke to [employer], she told [place] that she gave mom her medicine and she just feel asleep. Mom is already pumping but not getting any colostrum yet. Baby is currently on parenteral nutrition through a CVC. RN hasn't had a change to show mom how to hand express yet, but LC will come back to complete LC assessment and will review hand expression with mom.  Maternal Data    Feeding    LATCH Score                   Interventions    Lactation Tools Discussed/Used     Consult Status      [name] [name] 02/03/2019, 2:17 PM

## 2019-02-03 NOTE — Progress Notes (Signed)
Gram pos cocci in clusters on blood cx.  Ancef q 8 started and will D/C PICC.  Linda Hedges, DO

## 2019-02-04 LAB — CULTURE, BLOOD (ROUTINE X 2)

## 2019-02-04 LAB — CBC
HCT: 23.9 % — ABNORMAL LOW (ref 36.0–46.0)
Hemoglobin: 7.8 g/dL — ABNORMAL LOW (ref 12.0–15.0)
MCH: 29.9 pg (ref 26.0–34.0)
MCHC: 32.6 g/dL (ref 30.0–36.0)
MCV: 91.6 fL (ref 80.0–100.0)
Platelets: 232 10*3/uL (ref 150–400)
RBC: 2.61 MIL/uL — ABNORMAL LOW (ref 3.87–5.11)
RDW: 13.7 % (ref 11.5–15.5)
WBC: 17 10*3/uL — ABNORMAL HIGH (ref 4.0–10.5)
nRBC: 0 % (ref 0.0–0.2)

## 2019-02-04 LAB — GC/CHLAMYDIA PROBE AMP (~~LOC~~) NOT AT ARMC
Chlamydia: NEGATIVE
Neisseria Gonorrhea: NEGATIVE

## 2019-02-04 NOTE — Lactation Note (Signed)
This note was copied from a baby's chart. Lactation Consultation Note  Patient Name: Heather [name] ERXVQ'M Date: 02/04/2019 Reason for consult: Preterm <34wks;Infant < 6lbs;NICU baby;Other (Comment) Type of Endocrine Disorder?: Thyroid  P2 mother whose infant is now 42 hours old.  This is a preterm baby at 28+4 weeks weighing < 6 lbs and in the NICU.  Mother exclusively breast fed her first child (now almost 31 years old) for 2 months and spent another month pumping and bottle feeding.    Mother "tried everything" possible to increase her milk supply with her first child.  She stated she did all the "right" things, pumped often, tried supplements and medications but her milk supply was still low.  After 3 months she provided formula.  I praised her for all of her continued efforts with her first child and acknowledged that she may or may not have a milk supply issue with this pregnancy.  She is going to try to do all she can to obtain a full milk supply but realizes she may have to supplement.  She will be allowing donor milk in the NICU.  Spent a lot of time reviewing milk supply, pumping, pump parts, cleaning, hand expression, "hands free" pumping, colostrum collection and storage and doing STS as much as possible in the NICU.  Reviewed pumping in the NICU at baby's bedside to help encourage milk supply. Mother demonstrated hand expression but was unable to obtain a drop at this time, although it appears like a drop will soon be coming to the left breast.   Offered to initiate the pump since it had been 3 1/2 hours since her last pumping session and mother agreeable.  #27 flange size is the appropriate size to use at this time.  Observed mother pumping and reviewed suction settings.    Suggested mother call her insurance company to see about obtaining her DEBP early due to baby admitted to the NICU.  Mother plans to do this and will call this afternoon.  Otherwise, she has a Medela pump currently  but would like to obtain a Spectra from her insurance company.    Mother had all handouts related to the NICU and will get breast milk labels after she starts getting drops for the NICU.  Mother aware to use EBM to rub into nipples/areolas for lubrication and extra comfort after pumping.  Coconut oil is at the bedside.  Parents very receptive to learning and ask many appropriate questions.  They are both very knowledgeable and willing to do what they can for their NICU infant.  Father supportive.  RN updated.   Maternal Data Formula Feeding for Exclusion: Yes Reason for exclusion: Mother's choice to formula and breast feed on admission Has patient been taught Hand Expression?: Yes Does the patient have breastfeeding experience prior to this delivery?: Yes  Feeding    LATCH Score                   Interventions    Lactation Tools Discussed/Used WIC Program: No Pump Review: Setup, frequency, and cleaning;Milk Storage(Review) Initiated by:: Heather [name] Date initiated:: 02/04/19   Consult Status Consult Status: Follow-up Date: 02/05/19 Follow-up type: In-patient    Heather [name] R Bell Cai 02/04/2019, 2:28 PM

## 2019-02-04 NOTE — Progress Notes (Addendum)
    [facility] for Infectious Disease    Date of Admission:  02/02/2019   Total days of antibiotics 3           ID: Heather [name] is a 33 y.o. female with  Complicated pregnancy required picc line for hyperemesis gravidum, admitted for fever, PPROM found to have staph aureus bacteremia Active Problems:   Preterm premature rupture of membranes (PPROM) with unknown onset of labor   Sepsis ([place])   Acute chorioamnionitis   Preterm premature rupture of membranes (PPROM) delivered, current hospitalization    Subjective: Afebrile, feeling better, TTE did not show any vegetation but did show thickened MV  Medications:  . levothyroxine  12.5 mcg Oral QAC breakfast  . levothyroxine  175 mcg Oral Daily  . pantoprazole  40 mg Oral Daily  . prenatal multivitamin  1 tablet Oral Q1200  . senna-docusate  2 tablet Oral Q24H  . simethicone  80 mg Oral TID PC  . simethicone  80 mg Oral Q24H  . Tdap  0.5 mL Intramuscular Once    Objective: Vital signs in last 24 hours: Temp:  [97.8 F (36.6 C)-98.5 F (36.9 C)] 98.4 F (36.9 C) (08/18 1155) Pulse Rate:  [77-86] 85 (08/18 1155) Resp:  [16-18] 18 (08/18 1155) BP: (80-107)/(48-76) 99/71 (08/18 1155) SpO2:  [96 %-99 %] 98 % (08/18 1155)     Lab Results Recent Labs    02/02/19 0639 02/03/19 0644 02/04/19 0559  WBC 14.1* 25.2* 17.0*  HGB 10.6* 8.3* 7.8*  HCT 30.6* 24.1* 23.9*  NA 133*  --   --   K 3.1*  --   --   CL 103  --   --   CO2 15*  --   --   BUN 5*  --   --   CREATININE 0.71  --   --    Liver Panel Recent Labs    02/02/19 0639  PROT 5.9*  ALBUMIN 2.6*  AST 30  ALT 15  ALKPHOS 115  BILITOT 1.7*    Microbiology: 8/17 blood cx NGTD 8/16 blood cx MSSA Studies/Results: No results found.   Assessment/Plan: Staphylococcus aureus bacteremia =  TTE reassuring, though showed mildly thickened MV. Since suspicion for native valve endocarditis remains, recommend to get TEE. Continue with cefazolin. If TEE is  negative, then she only need 14 day of therapy. I have contacted TEE coordinator, earliest that TEE can be performed is [facility] for [facility] Cell: [phone] Pager: [phone]  02/04/2019, 2:18 PM

## 2019-02-04 NOTE — Clinical Social Work Maternal (Signed)
CLINICAL SOCIAL WORK MATERNAL/CHILD NOTE  Patient Details  Name: Heather [name] MRN: [id number] Date of Birth: 05-Sep-1985  Date:  02/04/2019  Clinical Social Worker Initiating Note:  Abundio Miu, [place] Date/Time: Initiated:  02/04/19/1139     Child's Name:  Heather [name]   Biological Parents:  Mother, Father   Need for Interpreter:  None   Reason for Referral:  Parental Support of Premature Babies < 32 weeks/or Critically Ill babies   Address:  [place] [address]    Phone number:  [phone] (home)     Additional phone number:   Household Members/Support Persons (HM/SP):   Household Member/Support Person 1, Household Member/Support Person 2   HM/SP Name Relationship DOB or Age  HM/SP -1 Heather [name] Husband/FOB    HM/SP -2 Heather [name] daughter 10/10/5359  HM/SP -3        HM/SP -4        HM/SP -5        HM/SP -6        HM/SP -7        HM/SP -8          Natural Supports (not living in the home):  Parent, Friends   Professional Supports: Therapist(Melissa Nurse, adult at [employer] [facility] counseling)   Employment: Animator   Type of Work: Research officer, trade union and Production manager   Education:  Forensic psychologist   Homebound arranged:    Museum/gallery curator Resources:  Multimedia programmer   Other Resources:      Cultural/Religious Considerations Which May Impact Care:    Strengths:  Ability to meet basic needs , Engineer, materials, Home prepared for child , Understanding of illness   Psychotropic Medications:         Pediatrician:    Careers adviser area  Pediatrician List:   [employer] Other([facility] (Dr. Maisie Fus))  [place]      Pediatrician Fax Number:    Risk Factors/Current Problems:      Cognitive State:  Able to Concentrate , Alert , Goal Oriented , Insightful , Linear Thinking    Mood/Affect:  Calm , Interested    CSW Assessment: CSW met with MOB and  FOB at bedside to discuss infant's NICU admission. CSW introduced self and explained reason for consult. MOB and FOB were pleasant and engaged during assessment. MOB reported that she resides with Husband/FOB and their 5 year old daughter Heather Clap) that is eager to meet her new little sister. MOB reported that she works at [employer] and has started to shop for infant. MOB reported that she has items from when her older daughter was baby and does not anticipate needing assistance obtaining any items for infant. CSW inquired about MOB's support system, MOB reported that her mother and some local mom friends are her supports. MOB reported that her mother resides in [place], [place] and will be staying for a few weeks to help her.   CSW and MOB/FOB discussed infant's NICU admission. MOB reported that she is impressed so far and feels well informed. MOB reported that every time she visits the infant in the NICU the nurses give her an update. FOB reported that he appreciates how the medical team provided them with realistic expectations in terms of infant's care. CSW informed MOB and FOB about the NICU, what to expect and resources/supports available while infant is admitted to the NICU. MOB and  FOB denied any needs/concerns. CSW encouraged MOB to contact CSW if any needs/concerns arise.   CSW asked FOB to leave the room to speak with MOB privately, FOB left voluntarily. CSW inquired about MOB's mental health history, MOB reported that she has a history of anxiety and depression from her teenage years. MOB reported that she sees a counselor regularly and prior to pregnancy she was taking Zoloft. MOB reported that she consulted with her doctor and will restart medication for anxiety and depression if needed. MOB reported that medication and seeing her counselor are effective in treating her anxiety and depressive symptoms. MOB reported that she has some anxiety symptoms in regards to infant being in the NICU.  CSW acknowledged and validated MOB's anxiety surrounding infant's health and NICU admission. CSW normalized MOB's feelings and encouraged her to rely on her supports during this difficult time. MOB presented calm and had great insight about her mental health. MOB did not demonstrate any acute mental health signs/symptoms. CSW assessed for safety, MOB denied SI, HI and domestic violence.   CSW provided education regarding the baby blues period vs. perinatal mood disorders, discussed treatment and gave resources for mental health follow up if concerns arise.  CSW recommends self-evaluation during the postpartum time period using the New Mom Checklist from Postpartum Progress and encouraged MOB to contact a medical professional if symptoms are noted at any time.    CSW will continue to offer resources/supports while infant is admitted to the NICU.   CSW Plan/Description:  Perinatal Mood and Anxiety Disorder (PMADs) Education, Other Patient/Family Education    Burnis Medin, LCSW 02/04/2019, 11:51 AM

## 2019-02-04 NOTE — Progress Notes (Signed)
Subjective: Postpartum Day 2: Cesarean Delivery Patient reports tolerating PO and no problems voiding.    Objective: Vital signs in last 24 hours: Temp:  [97.8 F (36.6 C)-98.5 F (36.9 C)] 98.5 F (36.9 C) (08/18 0823) Pulse Rate:  [77-97] 84 (08/18 0823) Resp:  [16-18] 16 (08/18 0823) BP: (80-107)/(48-76) 107/76 (08/18 0823) SpO2:  [96 %-99 %] 99 % (08/18 0823)  Physical Exam:  General: alert, cooperative and appears stated age Lochia: appropriate Uterine Fundus: firm Incision: healing well, no significant drainage, no dehiscence DVT Evaluation: No evidence of DVT seen on physical exam. Negative Homan's sign. No cords or calf tenderness.  Recent Labs    02/03/19 0644 02/04/19 0559  HGB 8.3* 7.8*  HCT 24.1* 23.9*    Assessment/Plan: Status post Cesarean section. Doing well postoperatively.  Staph bacteremia-ID following and appreciate their involvement.  Continue Ancef IV q 8.  WBC 25.2->17.0 this morning.  Patient continue to be AF and feels much better.  Linda Hedges 02/04/2019, 9:06 AM

## 2019-02-05 NOTE — Progress Notes (Signed)
Subjective: Postpartum Day 3: Cesarean Delivery Patient reports tolerating PO, + flatus and no problems voiding/. Hasn't needed zofran in >24 hours for the first time since being pregnant.    Objective: Vital signs in last 24 hours: Temp:  [98 F (36.7 C)-98.4 F (36.9 C)] 98 F (36.7 C) (08/19 0826) Pulse Rate:  [70-93] 93 (08/19 0826) Resp:  [16-18] 18 (08/19 0826) BP: (93-117)/(70-80) 93/75 (08/19 0826) SpO2:  [98 %-100 %] 100 % (08/19 0826)  Physical Exam:  General: alert, cooperative and appears stated age Lochia: appropriate Uterine Fundus: firm Incision: healing well, no significant drainage, no dehiscence, no significant erythema DVT Evaluation: No evidence of DVT seen on physical exam. Negative Homan's sign. No cords or calf tenderness. No significant calf/ankle edema.  Recent Labs    02/03/19 0644 02/04/19 0559  HGB 8.3* 7.8*  HCT 24.1* 23.9*    Assessment/Plan: Status post Cesarean section. Doing well postoperatively.  Continue current care. Staph bacteremia seems to be improving as clinically remains AF w/out any complaints. TTE was done yesterday and showed mildly thickened mitral valve. ID recommended TEE. ID is following and continue to appreciate their recs. Will continue to f/u with their recs. Cont Ancef q 8.    Heather [name] 02/05/2019, 9:47 AM

## 2019-02-05 NOTE — Progress Notes (Signed)
    Reviewed patient's chart. Hgb with notable drop from 10.6>>8.3>>7.6 without clear source of decline. Reviewed with MD scheduled for TEE tomorrow. Recommendation to follow Hgb trend to ensure no further decline. Will have patient rescheduled for Friday to allow for further monitoring.   Signed, Reino Bellis, NP-C 02/05/2019, 3:16 PM

## 2019-02-05 NOTE — Care Management Note (Signed)
Case Management Note  Patient Details  Name: Heather [name] MRN: [id number] Date of Birth: 28-Oct-1985  Subjective/Objective:                   Heather [name] is a 33 y.o. female with  Complicated pregnancy required picc line for hyperemesis gravidum, admitted for fever, PPROM found to have staph aureus bacteremia  Action/Plan: DC when medically stable      In-House Referral:   CSW  Discharge planning Services  CM Consult    Choice offered to:  Patient  Status of Service:  In process, will continue to follow   Additional Comments: Spoke to patient regarding discharge needs.  Patient has been active with [facility] during pregnancy and wants to continue [facility] care with them if needed after discharge.  Demographics reviewed and correct in Epic.  Per patient and note in chart, patient may need antibiotics for home.  CM will continue to follow and will set up with [facility] if needed.  Will continue to follow.  Yong Channel, RN 02/05/2019, 3:31 PM

## 2019-02-06 LAB — CBC
HCT: 26 % — ABNORMAL LOW (ref 36.0–46.0)
Hemoglobin: 8.7 g/dL — ABNORMAL LOW (ref 12.0–15.0)
MCH: 29.1 pg (ref 26.0–34.0)
MCHC: 33.5 g/dL (ref 30.0–36.0)
MCV: 87 fL (ref 80.0–100.0)
Platelets: 260 10*3/uL (ref 150–400)
RBC: 2.99 MIL/uL — ABNORMAL LOW (ref 3.87–5.11)
RDW: 12.6 % (ref 11.5–15.5)
WBC: 8 10*3/uL (ref 4.0–10.5)
nRBC: 0 % (ref 0.0–0.2)

## 2019-02-06 MED ORDER — FAMOTIDINE IN NACL 20-0.9 MG/50ML-% IV SOLN
20.0000 mg | INTRAVENOUS | Status: DC
  Administered 2019-02-06: 20 mg via INTRAVENOUS
  Filled 2019-02-06: qty 50

## 2019-02-06 MED ORDER — LACTATED RINGERS IV SOLN
INTRAVENOUS | Status: DC
  Administered 2019-02-06: 08:00:00 via INTRAVENOUS

## 2019-02-06 MED ORDER — KETOROLAC TROMETHAMINE 30 MG/ML IJ SOLN
30.0000 mg | Freq: Four times a day (QID) | INTRAMUSCULAR | Status: DC | PRN
  Administered 2019-02-06: 30 mg via INTRAVENOUS
  Filled 2019-02-06: qty 1

## 2019-02-06 MED ORDER — ONDANSETRON HCL 4 MG/2ML IJ SOLN
4.0000 mg | INTRAMUSCULAR | Status: DC | PRN
  Administered 2019-02-06 (×2): 4 mg via INTRAVENOUS

## 2019-02-06 NOTE — Progress Notes (Signed)
CSW followed up with FOB at bedside to offer support and assess for needs, concerns, and resources; RN was present and providing medical update to FOB. RN completed medical update. CSW inquired about how FOB and MOB were doing and if they had any needs/concerns. FOB reported that they were doing well and denied any needs/concerns. FOB appeared happy about infant's progress. CSW encouraged FOB to contact CSW if any needs/concerns arise.   FOB reported no psychosocial stressors at this time.   CSW will continue to offer support and resources to family while infant remains in NICU.   Soriya Worster, LCSW Clinical Social Worker [facility] Cell#: [phone]     

## 2019-02-06 NOTE — Lactation Note (Signed)
This note was copied from a baby's chart. Lactation Consultation Note  Patient Name: Heather [name] [id number] Date: 02/06/2019 Reason for consult: Follow-up assessment;NICU baby;Preterm <34wks Type of Endocrine Disorder?: Thyroid  I followed up with [name] to check on her progress with breast pumping. Her records indicate that she has been pumping about 6-7 times a day. She states that her last pump yielded about 10 mls/side (20 combined). This is day four, and I reviewed the anticipated pumping volume amounts/day in the table inside her NICU booklet.  Once [name] is able to express 20 mls three times in a row, she is to switch to the expression setting on her pump.  [name] had insufficient milk production with her first baby (now three) and tried multiple interventions (see earlier [facility] notes). She feels that her thyroid is being better monitored and her meds are being adjusted accordingly at this time. She is a bit concerned about her milk production.  I praised her for her efforts thus far and tried to encourage her to continue and allow her body to heal and adjust. She is still a little nauseated, but she is continuing to feel better. I challenged her to try to pump at least 8 times a day, and I likened that frequency to matching typical infant feeding patterns.   Mom has the [place] with her. She has not ordered her personal pump yet, but she has contacted her insurance provider and started the process. She wants to order a spectra pump. I made [name] aware of the [employer] pump rental option via our gift shop. She is going to check with her insurance to see if they cover the costs of pump rental.  [name] had no further questions at this time.    Maternal Data Formula Feeding for Exclusion: No Does the patient have breastfeeding experience prior to this delivery?: Yes  Interventions Interventions: Breast feeding basics reviewed  Lactation Tools  Discussed/Used Tools: Pump Breast pump type: Double-Electric Breast Pump Pump Review: Setup, frequency, and cleaning   Consult Status Consult Status: Follow-up Date: 02/07/19 Follow-up type: In-patient    Lenore Manner 02/06/2019, 3:59 PM

## 2019-02-06 NOTE — Progress Notes (Signed)
Subjective: Postpartum Day 4: Cesarean Delivery Patient reports tolerating PO, + flatus and no problems voiding.    Objective: Vital signs in last 24 hours: Temp:  [97.7 F (36.5 C)-98.7 F (37.1 C)] 97.7 F (36.5 C) (08/20 0526) Pulse Rate:  [66-93] 72 (08/20 0526) Resp:  [17-18] 18 (08/20 0526) BP: (93-123)/(75-83) 123/81 (08/20 0526) SpO2:  [99 %-100 %] 100 % (08/20 0526)  Physical Exam:  General: alert, cooperative and no distress Lochia: appropriate Uterine Fundus: firm Incision: healing well DVT Evaluation: No evidence of DVT seen on physical exam.  Recent Labs    02/04/19 0559 02/06/19 0726  HGB 7.8* 8.7*  HCT 23.9* 26.0*    Assessment/Plan: Status post Cesarean section. Postoperative course complicated by bacteremia  Continue current care. Change in Hgb not unexpected post op from C/S. Repeat CBC pending. Will start IV fluids TEE today or tomorrow per cardiology Appreciate ID management  Shon Millet II 02/06/2019, 7:57 AM

## 2019-02-06 NOTE — Lactation Note (Signed)
This note was copied from a baby's chart. Lactation Consultation Note  Patient Name: Heather [name] ERXVQ'M Date: 02/06/2019 Reason for consult: Follow-up assessment Type of Endocrine Disorder?: Thyroid  I returned briefly to the room to remind [name] to change her pump setting to the expression phase. She was pumping upon entry on the initation phase. I observed 10+ mls per side and praised mom for a job well done. She was using a size 27 flange. Her nipples swell a fair amount inside the flange and it appeared to be rubbing. I provided her with some size 30 flanges to try. She thanked me. No further questions.   Maternal Data Formula Feeding for Exclusion: No Does the patient have breastfeeding experience prior to this delivery?: Yes   Interventions Interventions: DEBP  Lactation Tools Discussed/Used Tools: Pump;Flanges Flange Size: 30;27 Breast pump type: Double-Electric Breast Pump Pump Review: Other (comment)(flange fit and settings)   Consult Status Consult Status: Follow-up Date: 02/07/19 Follow-up type: In-patient    Lenore Manner 02/06/2019, 4:14 PM

## 2019-02-06 NOTE — Progress Notes (Addendum)
   [place] has been requested to perform a transesophageal echocardiogram on Heather [name] for bacteremia.  After careful review of history and examination, the risks and benefits of transesophageal echocardiogram have been explained including risks of esophageal damage, perforation (1:10,000 risk), bleeding, pharyngeal hematoma as well as other potential complications associated with conscious sedation including aspiration, arrhythmia, respiratory failure and death. Alternatives to treatment were discussed, questions were answered. Patient is willing to proceed.   Patient was noted to have a drop in hemoglobin earlier this week from 10.6 >> 8.3 >> 7.8. Patient is post-partum day 4. Repeat hemoglobin today improved and stable at 8.7. Will recheck CBC tomorrow morning as well.   Of note, patient reported pregnancy complicated by hyperemesis and ptyalism (excessive salivation) and wanted to know if that would cause any complications. Reassured patient that this should not cause any significant complications.   Will place orders and make patient NPO at midnight.  Darreld Mclean, PA-C 02/06/2019 2:31 PM

## 2019-02-06 NOTE — H&P (View-Only) (Signed)
D/W Dr Christopher, cardiology. TEE will be tomorrow Regular diet now, NPO after midnight. 

## 2019-02-06 NOTE — Progress Notes (Signed)
D/W Dr Harrell Gave, cardiology. TEE will be tomorrow Regular diet now, NPO after midnight.

## 2019-02-07 ENCOUNTER — Inpatient Hospital Stay (HOSPITAL_COMMUNITY): Payer: 59 | Admitting: Anesthesiology

## 2019-02-07 ENCOUNTER — Ambulatory Visit (HOSPITAL_COMMUNITY): Admit: 2019-02-07 | Payer: 59 | Admitting: Internal Medicine

## 2019-02-07 ENCOUNTER — Inpatient Hospital Stay (HOSPITAL_COMMUNITY): Payer: 59

## 2019-02-07 ENCOUNTER — Encounter (HOSPITAL_COMMUNITY): Payer: Self-pay | Admitting: *Deleted

## 2019-02-07 ENCOUNTER — Encounter (HOSPITAL_COMMUNITY)
Admission: AD | Disposition: A | Discharge status: Home / Self Care | Payer: 59 | Source: Ambulatory Visit | Attending: Obstetrics & Gynecology

## 2019-02-07 DIAGNOSIS — I313 Pericardial effusion (noninflammatory): Secondary | ICD-10-CM

## 2019-02-07 DIAGNOSIS — A4101 Sepsis due to Methicillin susceptible Staphylococcus aureus: Secondary | ICD-10-CM

## 2019-02-07 DIAGNOSIS — R7881 Bacteremia: Secondary | ICD-10-CM

## 2019-02-07 DIAGNOSIS — T80219A Unspecified infection due to central venous catheter, initial encounter: Secondary | ICD-10-CM

## 2019-02-07 HISTORY — PX: BUBBLE STUDY: SHX6837

## 2019-02-07 HISTORY — PX: TEE WITHOUT CARDIOVERSION: SHX5443

## 2019-02-07 LAB — CBC
HCT: 27.7 % — ABNORMAL LOW (ref 36.0–46.0)
Hemoglobin: 9.4 g/dL — ABNORMAL LOW (ref 12.0–15.0)
MCH: 29.5 pg (ref 26.0–34.0)
MCHC: 33.9 g/dL (ref 30.0–36.0)
MCV: 86.8 fL (ref 80.0–100.0)
Platelets: 307 10*3/uL (ref 150–400)
RBC: 3.19 MIL/uL — ABNORMAL LOW (ref 3.87–5.11)
RDW: 12.4 % (ref 11.5–15.5)
WBC: 9.3 10*3/uL (ref 4.0–10.5)
nRBC: 0.2 % (ref 0.0–0.2)

## 2019-02-07 SURGERY — ECHOCARDIOGRAM, TRANSESOPHAGEAL
Anesthesia: Monitor Anesthesia Care

## 2019-02-07 MED ORDER — CEFAZOLIN IV (FOR PTA / DISCHARGE USE ONLY): 2.0000 g | Freq: Three times a day (TID) | INTRAVENOUS | 0 refills | Status: AC

## 2019-02-07 MED ORDER — OXYCODONE-ACETAMINOPHEN 5-325 MG PO TABS: 1.0000 | ORAL_TABLET | ORAL | 0 refills | Status: AC | PRN

## 2019-02-07 MED ORDER — PROPOFOL 10 MG/ML IV BOLUS
INTRAVENOUS | Status: DC | PRN
  Administered 2019-02-07 (×4): 50 mg via INTRAVENOUS

## 2019-02-07 MED ORDER — LACTATED RINGERS IV SOLN
INTRAVENOUS | Status: DC
  Administered 2019-02-07: 08:00:00 via INTRAVENOUS

## 2019-02-07 MED ORDER — IBUPROFEN 600 MG PO TABS: 600.0000 mg | ORAL_TABLET | Freq: Four times a day (QID) | ORAL | 0 refills | Status: AC | PRN

## 2019-02-07 MED ORDER — PROPOFOL 500 MG/50ML IV EMUL
INTRAVENOUS | Status: DC | PRN
  Administered 2019-02-07: 100 ug/kg/min via INTRAVENOUS

## 2019-02-07 MED ORDER — SODIUM CHLORIDE 0.9 % IV SOLN: INTRAVENOUS | Status: DC

## 2019-02-07 NOTE — Care Management Note (Signed)
Case Management Note  Patient Details  Name: [name] MRN: [id number] Date of Birth: [birth year]  Subjective/Objective:                   Heather Edlinis a 33 y.o.femalewith Complicated pregnancy required picc line for hyperemesis gravidum, admitted for fever, PPROM found to have staph aureus bacteremia Action/Plan:   Expected Discharge Date:  02/07/19               Expected Discharge Plan:  [place]    Discharge planning Services  CM Consult  Post Acute Care Choice:    Choice offered to:  Patient-  Patient was active with [facility] and wants to continue services with this agency  [facility] Arranged:  RN Regimen: cefazolin 2gm IV q8h End date: 02/17/2019 [facility] Agency:   [facility]; [facility]  Status of Service:  Completed, signed off    Additional Comments: Spoke to [facility] with [facility] and she will education family today and family will discharge after 4pm dose today and [place] will make 1st visit in the am 02/08/19 Saturday.   Yong Channel, RN 02/07/2019, 3:38 PM

## 2019-02-07 NOTE — Lactation Note (Addendum)
This note was copied from a baby's chart. Lactation Consultation Note  Patient Name: Heather [name] [id number] Date: 02/07/2019   Mom was thankful for the size 30 flanges provided, but feels that the size 27 flanges are the better fit. Mom is using a little bit of coconut oil to lubricate the flanges (she knows to use as little as possible to achieve comfort). Mom has been pumping about 1 oz/pumping session.   Mom had a TEE this morning & received propofol. She had been told that it is compatible with breastfeeding and per Heather [name]'s "Medications & Mother's Milk" 2019, it is an L2. [employer] also says that "amounts of propofol in milk are very small and are not expected to be absorbed by the infant." However, parents are concerned about potential effects on their infant since infant was born at 83 weeks (now with a CGA of 29 weeks). I spoke with Heather [name], whose resource showed that it was "not advisable" to breastfeed after receiving propofol. After she  spoke to a neonatal pharmacist, the plan is as follows: pump & dump for 24 hrs. From 24-48 hours, she can pump and freeze the milk until infant is older (has a CGA of term or more). Parents were informed of pumping plan & verbalize understanding. Parents are also aware that there have been case reports of milk turning blue-green after propofol has been administered. RNs were updated.   Heather [name] [facility] 02/07/2019, 10:49 AM

## 2019-02-07 NOTE — Progress Notes (Signed)
Echocardiogram Echocardiogram Transesophageal has been performed.  Heather [name] 02/07/2019, 8:31 AM

## 2019-02-07 NOTE — Anesthesia Postprocedure Evaluation (Signed)
Anesthesia Post Note  Patient: Olympia Adelsberger  Procedure(s) Performed: TRANSESOPHAGEAL ECHOCARDIOGRAM (TEE) (N/A ) BUBBLE STUDY     Patient location during evaluation: Endoscopy Anesthesia Type: MAC Level of consciousness: awake and alert Pain management: pain level controlled Vital Signs Assessment: post-procedure vital signs reviewed and stable Respiratory status: spontaneous breathing, nonlabored ventilation, respiratory function stable and patient connected to nasal cannula oxygen Cardiovascular status: blood pressure returned to baseline and stable Postop Assessment: no apparent nausea or vomiting Anesthetic complications: no    Last Vitals:  Vitals:   02/07/19 0847 02/07/19 1033  BP: 132/82 119/77  Pulse:  78  Resp:  16  Temp:  36.9 C  SpO2:  99%    Last Pain:  Vitals:   02/07/19 1033  TempSrc: Oral  PainSc:                  Norman S

## 2019-02-07 NOTE — Progress Notes (Signed)
[facility]    Date of Admission:  02/02/2019   Total days of antibiotics            ID: Heather [name] is a 33 y.o. female with  SAB likely related to previous long term indwelling picc line Active Problems:   Preterm premature rupture of membranes (PPROM) with unknown onset of labor   Sepsis (HCC)   Acute chorioamnionitis   Preterm premature rupture of membranes (PPROM) delivered, current hospitalization    Subjective: Remains afebrile, TEE this morning ruled out NVE. Still has some abdominal discomfort from csection as one would expect. Incision is healing  Medications:  . levothyroxine  12.5 mcg Oral QAC breakfast  . levothyroxine  175 mcg Oral Daily  . pantoprazole  40 mg Oral Daily  . prenatal multivitamin  1 tablet Oral Q1200  . senna-docusate  2 tablet Oral Q24H  . simethicone  80 mg Oral TID PC  . simethicone  80 mg Oral Q24H  . Tdap  0.5 mL Intramuscular Once    Objective: Vital signs in last 24 hours: Temp:  [98 F (36.7 C)-98.9 F (37.2 C)] 98.4 F (36.9 C) (08/21 1033) Pulse Rate:  [56-79] 78 (08/21 1033) Resp:  [14-18] 16 (08/21 1033) BP: (108-138)/(70-96) 119/77 (08/21 1033) SpO2:  [96 %-100 %] 99 % (08/21 1033)  Physical Exam  Constitutional:  oriented to person, place, and time. appears well-developed and well-nourished. No distress.  HENT: [place]/AT, PERRLA, no scleral icterus Mouth/Throat: Oropharynx is clear and moist. No oropharyngeal exudate.  Cardiovascular: Normal rate, regular rhythm and normal heart sounds. Exam reveals no gallop and no friction rub.  No murmur heard.  Pulmonary/Chest: Effort normal and breath sounds normal. No respiratory distress.  has no wheezes.  Ext:piv in right hand Skin: Skin is warm and dry. No rash noted. No erythema. Healed picc line site on left upper arm Psychiatric: a normal mood and affect.  behavior is normal.    Lab Results Recent Labs    02/06/19 0726 02/07/19 0441  WBC 8.0  9.3  HGB 8.7* 9.4*  HCT 26.0* 27.7*    Microbiology: 8/17 blood cx ngtd Studies/Results: No results found.   Assessment/Plan: Simple bacteremia - since endocarditis has been ruled out. She will need to complete 14 day course of iv cefazolin 2gm IV q 8hr using 8/17 as day 1. She will either need new picc line/midline however the patient wants to continue with PIV access that she currently has.  Her home health team is determining if that is feasible.  OPAT orders in place.  Diagnosis: MSSA bacteremia  Culture Result: MSSA  Allergies  Allergen Reactions  . Penicillins Other (See Comments)    Reaction:  Unknown  Has patient had a PCN reaction causing immediate rash, facial/tongue/throat swelling, SOB or lightheadedness with hypotension: Unsure Has patient had a PCN reaction causing severe rash involving mucus membranes or skin necrosis: Unsure Has patient had a PCN reaction that required hospitalization Unsure Has patient had a PCN reaction occurring within the last 10 years: No If all of the above answers are "NO", then may proceed with Cephalosporin use.  . Chlorhexidine Gluconate Rash  . Monistat 1 [Tioconazole] Rash and Other (See Comments)    Yeast infection cream caused pain.   --------------------------------------------------- OPAT Orders Discharge antibiotics: Per pharmacy protocol -cefazolin 2gm IV Q8hr  Duration: 14 day using 8/17 as day 1 End Date: 8/31  [facility] Per Protocol:  Labs weekly  while on IV antibiotics: _x_ CBC with differential _x_ BMP   _x_ Please pull PIC/PIV  at completion of IV antibiotics   Fax weekly labs to [phone]  Clinic Follow Up Appt: 10-11 day we will do phone visit  @ [place] [facility] Cell: [phone] Pager: [phone]  02/07/2019, 12:14 PM

## 2019-02-07 NOTE — Discharge Summary (Addendum)
Obstetric Discharge Summary Reason for Admission: rupture of membranes, fever Prenatal Procedures: ultrasound Intrapartum Procedures: cesarean: low cervical, transverse Postpartum Procedures: antibiotics and TTE, TEE, PICC line removal Complications-Operative and Postpartum: bacteremia Hemoglobin  Date Value Ref Range Status  02/07/2019 9.4 (L) 12.0 - 15.0 g/dL Final   HCT  Date Value Ref Range Status  02/07/2019 27.7 (L) 36.0 - 46.0 % Final    Physical Exam:  General: alert and cooperative Lochia: appropriate Uterine Fundus: firm Incision: healing well, no significant drainage DVT Evaluation: No evidence of DVT seen on physical exam.  Discharge Diagnoses: Term Pregnancy-delivered  Discharge Information: Date: 02/07/2019 Activity: pelvic rest Diet: routine Medications: PNV, Ibuprofen, Percocet and cefazolin Condition: stable Instructions: refer to practice specific booklet Discharge to: home   Newborn Data: Live born female  Birth Weight: 2 lb 12.1 oz (1250 g) APGAR: 2, 7  Newborn Delivery   Birth date/time: 02/02/2017 11:57:00 Delivery type: C-Section, Low Transverse Trial of labor: No C-section categorization: Primary      NICU.  Marylynn Pearson 02/07/2019, 1:19 PM

## 2019-02-07 NOTE — Anesthesia Procedure Notes (Signed)
Procedure Name: MAC Date/Time: 02/07/2019 8:15 AM Performed by: Renato Shin, CRNA Pre-anesthesia Checklist: Patient identified, Emergency Drugs available, Suction available and Patient being monitored Patient Re-evaluated:Patient Re-evaluated prior to induction Oxygen Delivery Method: Nasal cannula Preoxygenation: Pre-oxygenation with 100% oxygen Induction Type: IV induction Placement Confirmation: positive ETCO2 and breath sounds checked- equal and bilateral Dental Injury: Teeth and Oropharynx as per pre-operative assessment

## 2019-02-07 NOTE — Transfer of Care (Signed)
Immediate Anesthesia Transfer of Care Note  Patient: Heather [name]  Procedure(s) Performed: TRANSESOPHAGEAL ECHOCARDIOGRAM (TEE) (N/A ) BUBBLE STUDY  Patient Location: PACU and Endoscopy Unit  Anesthesia Type:MAC  Level of Consciousness: awake, alert  and patient cooperative  Airway & Oxygen Therapy: Patient Spontanous Breathing and Patient connected to nasal cannula oxygen  Post-op Assessment: Report given to RN and Post -op Vital signs reviewed and stable  Post vital signs: Reviewed and stable  Last Vitals:  Vitals Value Taken Time  BP    Temp    Pulse 62 02/07/19 0837  Resp 13 02/07/19 0837  SpO2 98 % 02/07/19 0837  Vitals shown include unvalidated device data.  Last Pain:  Vitals:   02/07/19 0733  TempSrc: Oral  PainSc: 0-No pain      Patients Stated Pain Goal: 3 (70/26/37 8588)  Complications: No apparent anesthesia complications

## 2019-02-07 NOTE — Anesthesia Preprocedure Evaluation (Addendum)
Anesthesia Evaluation  Patient identified by MRN, date of birth, ID band Patient awake    Reviewed: Allergy & Precautions, NPO status , Patient's Chart, lab work & pertinent test results  History of Anesthesia Complications (+) PONV and history of anesthetic complications  Airway Mallampati: I  TM Distance: >3 FB Neck ROM: Full    Dental no notable dental hx. (+) Teeth Intact, Dental Advisory Given   Pulmonary neg pulmonary ROS,    Pulmonary exam normal breath sounds clear to auscultation       Cardiovascular + angina Normal cardiovascular exam Rhythm:Regular Rate:Normal  TTE 01/2019 EF 60-65%, no significant valvular abnormalities   Neuro/Psych  Headaches, PSYCHIATRIC DISORDERS Anxiety Depression    GI/Hepatic negative GI ROS, Neg liver ROS,   Endo/Other  Hypothyroidism   Renal/GU negative Renal ROS  negative genitourinary   Musculoskeletal negative musculoskeletal ROS (+)   Abdominal   Peds  Hematology  (+) Blood dyscrasia (Hgb 9.4), anemia ,   Anesthesia Other Findings C/S on 8/16 now with MSSA bacteremia  Reproductive/Obstetrics                            Anesthesia Physical Anesthesia Plan  ASA: III  Anesthesia Plan: MAC   Post-op Pain Management:    Induction: Intravenous  PONV Risk Score and Plan: 3 and Propofol infusion and Treatment may vary due to age or medical condition  Airway Management Planned: Natural Airway and Nasal Cannula  Additional Equipment:   Intra-op Plan:   Post-operative Plan:   Informed Consent: I have reviewed the patients History and Physical, chart, labs and discussed the procedure including the risks, benefits and alternatives for the proposed anesthesia with the patient or authorized representative who has indicated his/her understanding and acceptance.     Dental advisory given  Plan Discussed with: CRNA  Anesthesia Plan Comments:          Anesthesia Quick Evaluation

## 2019-02-07 NOTE — CV Procedure (Signed)
TEE   Pt sedated by anesthesia with propofol With bite guard in place TEE probe advanced to mid esophagus without difficulty  TV normal  Trace TR MV normal  TRace MR AV normal   No AI PV normal   Trace PI  No vegetations seen  LA, LAA without masses  PFO - suspicion for by color doppler but when tested with injection of agitated saline was not present  Normal aorta  LVEF and RVEF normal   Full report to follow   Dorris Carnes MD

## 2019-02-07 NOTE — Progress Notes (Signed)
Discussed discharge instructions, medications, allergies, antibiotic safety, signs and symptoms to report, Baby Blues, and scheduling a follow up appointment w/ patient and significant other. No questions.

## 2019-02-07 NOTE — Progress Notes (Signed)
PHARMACY CONSULT NOTE FOR:  OUTPATIENT  PARENTERAL ANTIBIOTIC THERAPY (OPAT)  Indication: MSSA bacteremia Regimen: cefazolin 2gm IV q8h End date: 02/17/2019  IV antibiotic discharge orders are pended. To discharging provider:  please sign these orders via discharge navigator,  Select New Orders & click on the button choice - Manage This Unsigned Work.     Thank you for allowing pharmacy to be a part of this patient's care.  Doreene Eland, PharmD, BCPS.   Work Cell: [phone] 02/07/2019 12:18 PM

## 2019-02-07 NOTE — Interval H&P Note (Signed)
History and Physical Interval Note:  02/07/2019 8:50 AM  Heather [name]  has presented today for surgery, with the diagnosis of BACTEREMIA.  The various methods of treatment have been discussed with the patient and family. After consideration of risks, benefits and other options for treatment, the patient has consented to  Procedure(s): TRANSESOPHAGEAL ECHOCARDIOGRAM (TEE) (N/A) BUBBLE STUDY as a surgical intervention.  The patient's history has been reviewed, patient examined, no change in status, stable for surgery.  I have reviewed the patient's chart and labs.  Questions were answered to the patient's satisfaction.     Dorris Carnes

## 2019-02-08 LAB — CULTURE, BLOOD (ROUTINE X 2)
Culture: NO GROWTH
Culture: NO GROWTH
Special Requests: ADEQUATE
Special Requests: ADEQUATE

## 2019-02-11 ENCOUNTER — Other Ambulatory Visit: Payer: Self-pay | Admitting: Internal Medicine

## 2019-02-11 MED ORDER — LINEZOLID 600 MG PO TABS: 600.0000 mg | ORAL_TABLET | Freq: Two times a day (BID) | ORAL | 0 refills | Status: AC

## 2019-02-11 NOTE — Progress Notes (Signed)
Patient being treated for MSSA bacteremia. Lost IV access yesterday and doesn't want midline/picc line to finish out course  Alternative treatment would be finishing out course with linezolid 600mg  bid through aug 30th. (total of 7 day). Also recommended to pump/dump breastmilk which she is aware.  This has been communicated to patient/her obs and home health RN

## 2019-02-20 ENCOUNTER — Ambulatory Visit (INDEPENDENT_AMBULATORY_CARE_PROVIDER_SITE_OTHER): Payer: 59 | Admitting: Internal Medicine

## 2019-02-20 ENCOUNTER — Other Ambulatory Visit: Payer: Self-pay

## 2019-02-20 ENCOUNTER — Encounter: Payer: Self-pay | Admitting: Internal Medicine

## 2019-02-20 DIAGNOSIS — R7881 Bacteremia: Secondary | ICD-10-CM

## 2019-03-03 NOTE — Progress Notes (Signed)
RFV: follow up from MRSA bacteremia hospitalization  Patient ID: Heather [name], female   DOB: Sep 29, 1985, 33 y.o.   MRN: [id number]  HPI  Heather [name] is a 33 y.o. female with  SAB likely related to previous long term indwelling picc line for pregnancy related nausea/vomiting. She was admitted with PPROM in the setting of MRSA bacteremia. She had TEE which ruled out endocarditis. She was initially discharged on cefazolin but loss her peripheral iv site. She was transitioned to oral linezolid which she finished out without difficulty to finish out 14 day course of therapy.   She is healing from csection and her  Incision is healing well.   Outpatient Encounter Medications as of 02/20/2019  Medication Sig  . levothyroxine (SYNTHROID, LEVOTHROID) 25 MCG tablet Take 12.5 mcg by mouth daily before breakfast. Take with 1 tablet of 175 mcg to equal 187.5 mcg  . SYNTHROID 175 MCG tablet Take 175 mcg by mouth daily. Take with 1/2 tablet of 25mcg to equal 187.5 mcg  . docusate sodium (COLACE) 100 MG capsule Take 100 mg by mouth 2 (two) times a day.  . famotidine (PEPCID) 20 MG tablet Take 20 mg by mouth daily.  Marland Kitchen. ibuprofen (ADVIL) 600 MG tablet Take 1 tablet (600 mg total) by mouth every 6 (six) hours as needed for mild pain. (Patient not taking: Reported on 02/20/2019)  . ondansetron (ZOFRAN-ODT) 8 MG disintegrating tablet Take 8 mg by mouth every 8 (eight) hours as needed for nausea.   Marland Kitchen. oxyCODONE-acetaminophen (PERCOCET/ROXICET) 5-325 MG tablet Take 1-2 tablets by mouth every 4 (four) hours as needed for moderate pain. (Patient not taking: Reported on 02/20/2019)  . pantoprazole (PROTONIX) 40 MG tablet Take 1 tablet by mouth daily.  . promethazine (PHENERGAN) 25 MG tablet Take 25 mg by mouth 6 (six) times daily.   No facility-administered encounter medications on file as of 02/20/2019.      Patient Active Problem List   Diagnosis Date Noted  . Staphylococcus aureus bacteremia with sepsis (HCC)    . PICC line infection, initial encounter   . Preterm premature rupture of membranes (PPROM) with unknown onset of labor 02/02/2019  . Sepsis (HCC) 02/02/2019  . Acute chorioamnionitis 02/02/2019  . Preterm premature rupture of membranes (PPROM) delivered, current hospitalization 02/02/2019  . Pelvic pain 04/09/2017  . Candida vaginitis 04/09/2017  . Pregnancy 12/28/2015  . Hydronephrosis determined by ultrasound 07/06/2015  . Hyperemesis arising during pregnancy 08/19/2014  . Hyperemesis affecting pregnancy, antepartum 08/12/2014  . Appendicitis, acute 01/12/2014     Health Maintenance Due  Topic Date Due  . PAP SMEAR-Modifier  08/12/2017  . INFLUENZA VACCINE  01/18/2019    Social History   Tobacco Use  . Smoking status: Never Smoker  . Smokeless tobacco: Never Used  Substance Use Topics  . Alcohol use: Not Currently  . Drug use: No    Review of Systems Review of Systems  Constitutional: Negative for fever, chills, diaphoresis, activity change, appetite change, fatigue and unexpected weight change.  HENT: Negative for congestion, sore throat, rhinorrhea, sneezing, trouble swallowing and sinus pressure.  Eyes: Negative for photophobia and visual disturbance.  Respiratory: Negative for cough, chest tightness, shortness of breath, wheezing and stridor.  Cardiovascular: Negative for chest pain, palpitations and leg swelling.  Gastrointestinal: Negative for nausea, vomiting, abdominal pain, diarrhea, constipation, blood in stool, abdominal distention and anal bleeding.  Genitourinary: Negative for dysuria, hematuria, flank pain and difficulty urinating.  Musculoskeletal: Negative for myalgias, back pain, joint swelling, arthralgias  and gait problem.  Skin: Negative for color change, pallor, rash and wound.  Neurological: Negative for dizziness, tremors, weakness and light-headedness.  Hematological: Negative for adenopathy. Does not bruise/bleed easily.  Psychiatric/Behavioral:  Negative for behavioral problems, confusion, sleep disturbance, dysphoric mood, decreased concentration and agitation.    Physical Exam   No exam Lab Results  Component Value Date   LABRPR Non Reactive 12/28/2015    CBC Lab Results  Component Value Date   WBC 9.3 02/07/2019   RBC 3.19 (L) 02/07/2019   HGB 9.4 (L) 02/07/2019   HCT 27.7 (L) 02/07/2019   PLT 307 02/07/2019   MCV 86.8 02/07/2019   MCH 29.5 02/07/2019   MCHC 33.9 02/07/2019   RDW 12.4 02/07/2019   LYMPHSABS 0.6 (L) 02/02/2019   MONOABS 0.2 02/02/2019   EOSABS 0.0 02/02/2019    BMET Lab Results  Component Value Date   NA 133 (L) 02/02/2019   K 3.1 (L) 02/02/2019   CL 103 02/02/2019   CO2 15 (L) 02/02/2019   GLUCOSE 126 (H) 02/02/2019   BUN 5 (L) 02/02/2019   CREATININE 0.71 02/02/2019   CALCIUM 8.9 02/02/2019   GFRNONAA >60 02/02/2019   GFRAA >60 02/02/2019      Assessment and Plan mrsa bacteremia = finished treatment without difficulty. No need for further follow up unless relapse

## 2019-03-17 ENCOUNTER — Ambulatory Visit: Payer: Self-pay

## 2019-03-17 NOTE — Lactation Note (Signed)
This note was copied from a baby's chart. Lactation Consultation Note  Patient Name: Heather [name] [id number] Date: 03/17/2019 Reason for consult: Follow-up assessment;Preterm <34wks;NICU baby;Other (Comment)(Pain at every pumping session)   LC Follow Up Visit:  Called to NICU to visit with this mother who continues to have pain with pumping.  She has a Medela DEBP and uses the same pump here at the hospital.  She has also started getting a lower milk volume now like she experienced with her first child.  Mother's history is significant for hypothyroidism but no other medical condition.  Mother exclusively breast fed her first child (now three years old) for 2 months and then pumped and bottle fed for another month.  She had pain with her first child with pumping and eventually had a low milk supply and had to formula feed.    Mother seems to be encountering the same issues with this baby.  She has tried changing flange sizes, reducing the suction pressure, breast massage, breast compressions, lubricating flanges, warm showers and compresses, relaxation, pumping at baby's bedside but nothing seems to help.    Met with two other lactation consultants on site Vivianne Master and Elinor Dodge) who assisted me with possible reasons as to what she may be experiencing.  Some of her symptoms seem to be associated with vasospasms inside the breast/nipple tissue.  Handout on this copied and reviewed with mother in NICU.  Medication suggestions listed on handout and a contact person in the [facility] system in [place] provided.  Also suggested Pumpin Pals as an alternate flange for greater comfort.  Mother will also try power pumping to see if this might help her milk supply.  Mother will be going for labwork today after visiting her baby and has an OB follow up appointment on Wednesday; suggested she take the handout with her to the appointment.  Mother very appreciative of suggestions provided.  I will hope  to check in with her in another week or two to see if this was helpful advice.  Asked mother to let us know on the progress of this condition if she could.  Mother verbalized understanding.       Mikai Meints R Afrika Brick 03/17/2019, 3:56 PM

## 2019-03-29 ENCOUNTER — Ambulatory Visit: Payer: Self-pay

## 2019-03-29 NOTE — Lactation Note (Signed)
This note was copied from a baby's chart. Lactation Consultation Note  Patient Name: Heather [name] [id number] Date: 03/29/2019  attempt to see mom.  Mom not here.  Spoke with RN who reports mom is still pumping and she knows of no concerns with mom at this time.  Urged her to call lactation as needed.   Maternal Data    Feeding Feeding Type: Breast Milk with Formula added Nipple Type: [facility] My[facility]  [facility] Score                   Interventions    Lactation Tools Discussed/Used     Consult Status      [name] 03/29/2019, 8:01 PM

## 2020-09-16 IMAGING — CT CT ANGIOGRAPHY CHEST
1 of 7 series · 3 of 16 positions shown · IV contrast (omnipaque)
Comparison: CTA chest 01/10/2018

CLINICAL DATA: 4 hours of right-sided chest pain, no fever or
cough. High probability of ACS/PE/AS

EXAM:
CT ANGIOGRAPHY CHEST WITH CONTRAST
TECHNIQUE: Multidetector CT imaging of the chest was performed using the
standard protocol during bolus administration of intravenous
contrast. Multiplanar CT image reconstructions and MIPs were
obtained to evaluate the vascular anatomy.
CONTRAST:  100mL OMNIPAQUE IOHEXOL 350 MG/ML SOLN

[Series 5: thins · axial · 0.66mm/px · z∈[+747,+878]mm · 3 of 375 slices shown]
[im 94/375  lung]
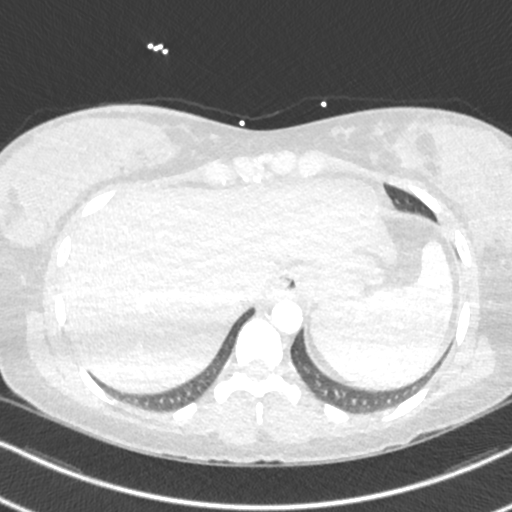
[im 188/375  soft-tissue]
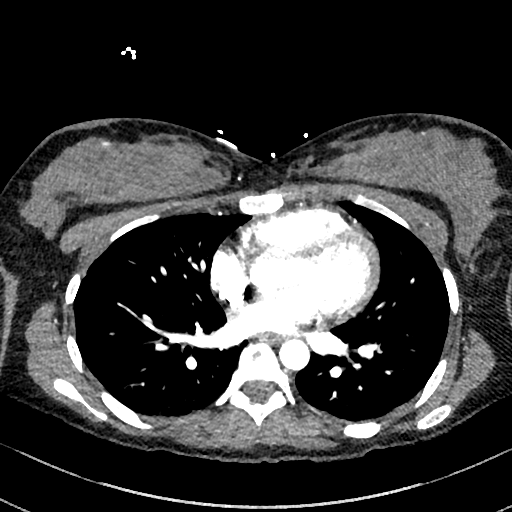
[im 281/375  lung]
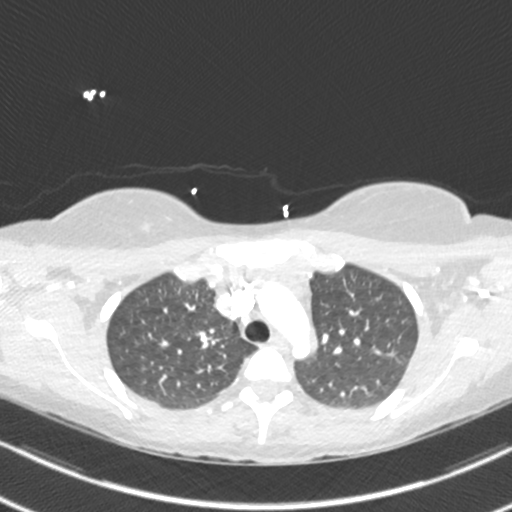

[3 of 16 positions shown; findings below may reference images not displayed]

FINDINGS: Cardiovascular: Satisfactory opacification of the pulmonary arteries
to the segmental level. No evidence of pulmonary embolism. Normal
heart size. No pericardial effusion. Normal 3 vessel aortic arch.
Cardiac pulsation artifact limits evaluation of the aortic root. No
definite acute aortic abnormality. No periaortic inflammation.

Mediastinum/Nodes: No enlarged mediastinal, hilar, or axillary lymph
nodes. Thyroid gland, trachea, and esophagus demonstrate no
significant findings.

Lungs/Pleura: Patchy mosaic attenuation likely the result of imaging
during exhalation with bowing of the posterior trachea. No
consolidative process. No pneumothorax or effusion. No suspicious
nodules or masses. Dependent atelectasis posteriorly.

Upper Abdomen: No acute abnormality. Excretion of contrast medium
within both kidneys likely related to separate bolus from CT
angiography of the neck.

Musculoskeletal: No chest wall abnormality. No acute or significant
osseous findings.

Review of the MIP images confirms the above findings.
IMPRESSION: Normal CT angiography of the chest. No evidence of pulmonary
embolism.

## 2020-09-16 IMAGING — CT CT ANGIOGRAPHY NECK
2 of 7 series · 8 of 33 positions shown · IV contrast (APPLIED)
Comparison: None.

CLINICAL DATA: Thrombus left internal jugular vein noted on bedside
ultrasound. Pregnant and hypercoagulable condition. On heparin.

EXAM:
CT ANGIOGRAPHY NECK
TECHNIQUE: Multidetector CT imaging of the neck was performed using the
standard protocol during bolus administration of intravenous
contrast. Multiplanar CT image reconstructions and MIPs were
obtained to evaluate the vascular anatomy. Carotid stenosis
measurements (when applicable) are obtained utilizing NASCET
criteria, using the distal internal carotid diameter as the
denominator.
CONTRAST:  100mL OMNIPAQUE IOHEXOL 350 MG/ML SOLN

[Series 5: cta neck · axial · 0.55mm/px · z∈[+941,+1015]mm · 2 of 113 slices shown]
[im 38/113  soft-tissue]
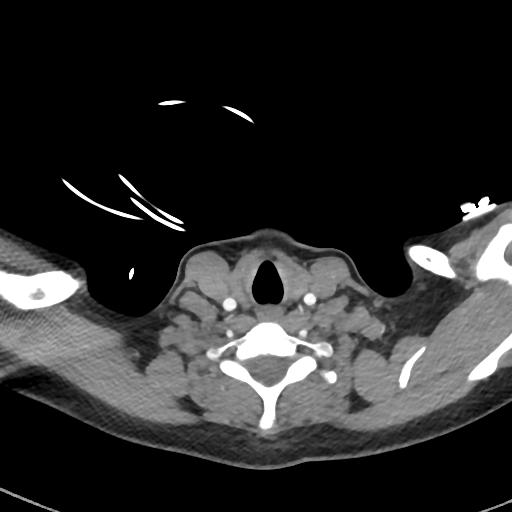
[im 75/113  soft-tissue]
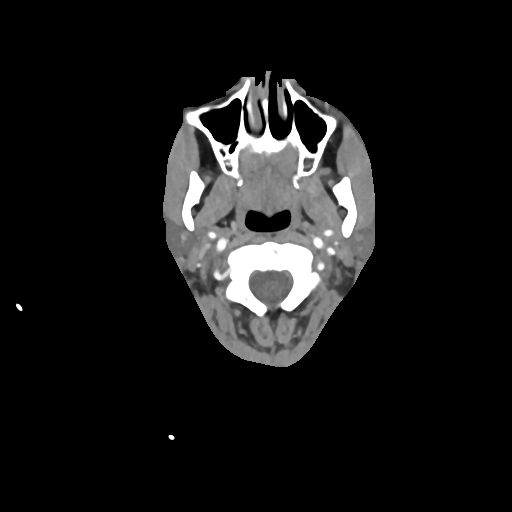

[Series 7: ax thins · axial · 0.39mm/px · z∈[+899,+1059]mm · 6 of 225 slices shown]
[im 33/225  soft-tissue]
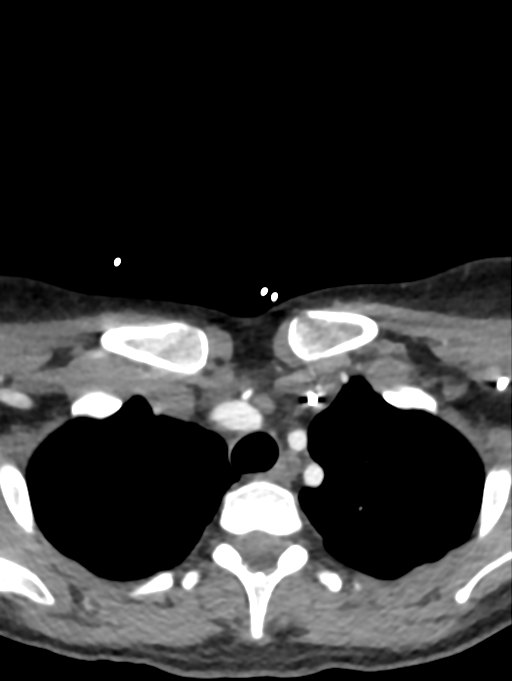
[im 65/225  bone]
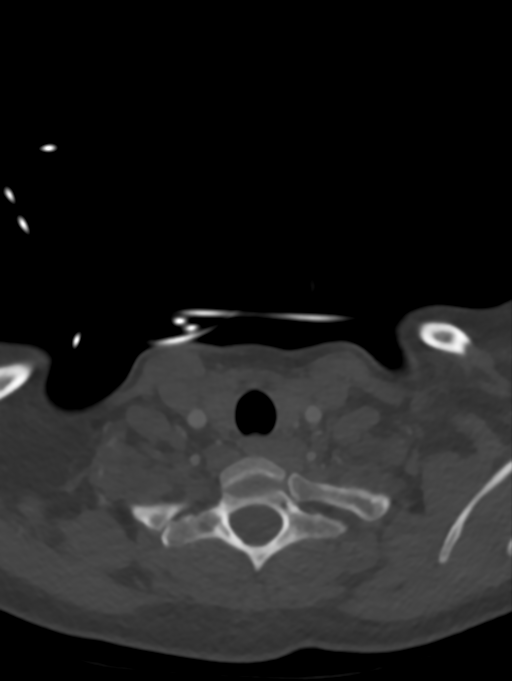
[im 97/225  soft-tissue]
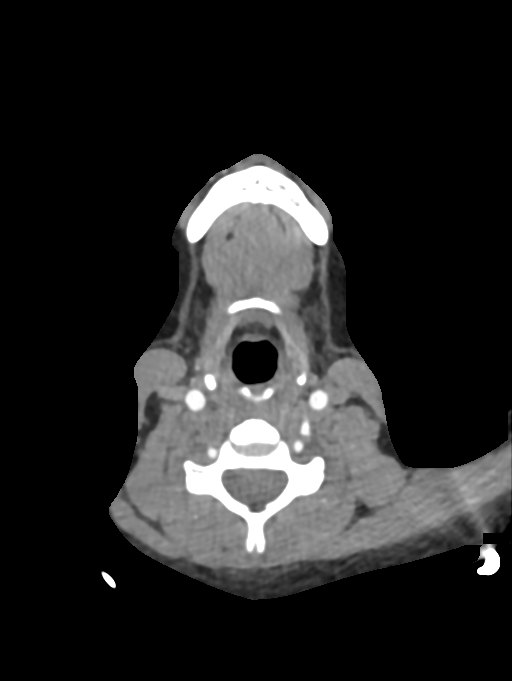
[im 129/225  bone]
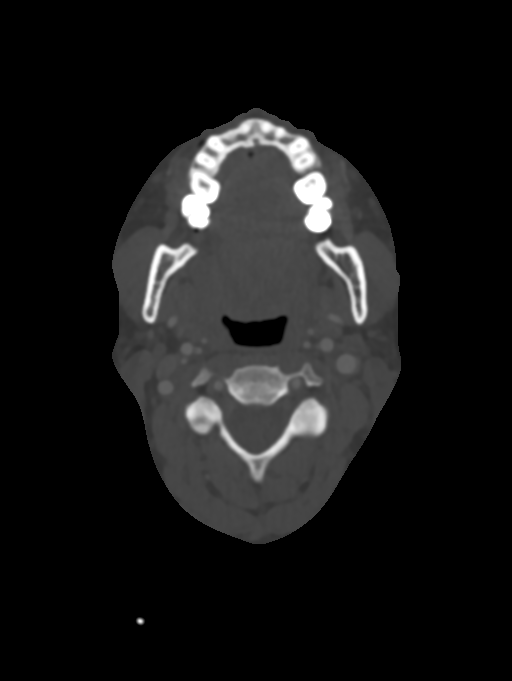
[im 161/225  soft-tissue]
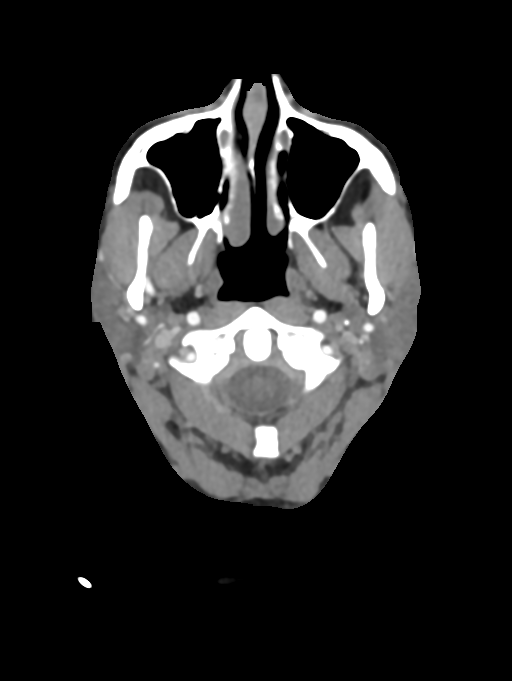
[im 193/225  bone]
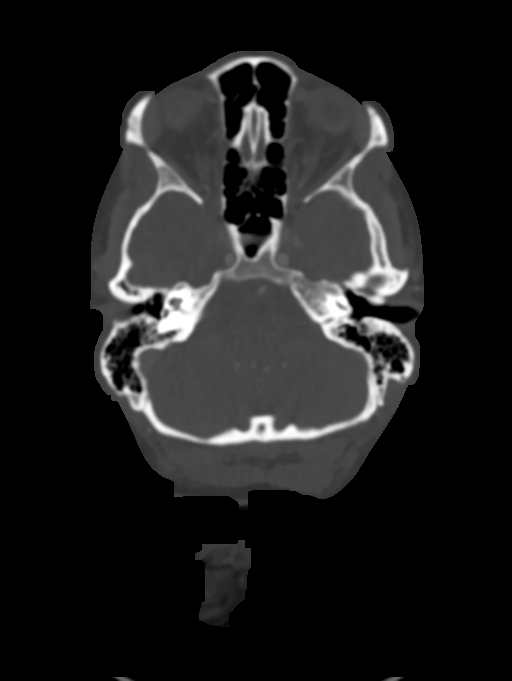

[8 of 33 positions shown; findings below may reference images not displayed]

FINDINGS: Aortic arch: Standard branching. Imaged portion shows no evidence of
aneurysm or dissection. No significant stenosis of the major arch
vessel origins. Left arm PICC tip enters the left innominate vein.

Right carotid system: Right carotid system widely patent without
stenosis or irregularity

Left carotid system: Left carotid system widely patent without
stenosis or irregularity

Vertebral arteries: Both vertebral arteries patent to the basilar
without stenosis.

Skeleton: 2 negative

Other neck: Limited venous evaluation. Arterial phase scanning.
Small amount of contrast is seen in the proximal jugular veins which
are patent bilaterally. There is minimal contrast in the mid and
distal jugular vein bilaterally with limited evaluation. I cannot
exclude jugular vein thrombosis.

Upper chest: Lung apices clear bilaterally.
IMPRESSION: Negative CT angiography of the neck. Carotid and vertebral arteries
are normal

Limited venous evaluation due to arterial phase scanning.
Unfortunately, delayed venous phase imaging was not obtained in this
study and are not able to evaluate for jugular venous thrombosis.

## 2023-05-14 ENCOUNTER — Other Ambulatory Visit (HOSPITAL_COMMUNITY): Payer: Self-pay

## 2023-05-14 ENCOUNTER — Other Ambulatory Visit: Payer: Self-pay

## 2023-05-14 MED ORDER — AMPHETAMINE-DEXTROAMPHET ER 25 MG PO CP24
25.0000 mg | ORAL_CAPSULE | Freq: Every morning | ORAL | 0 refills | Status: AC
Start: 2023-05-14 — End: ?
  Filled 2023-05-14: qty 30, 30d supply, fill #0
